# Patient Record
Sex: Female | Born: 1950 | Race: White | Hispanic: No | Marital: Married | Smoking: Never smoker
Health system: Southern US, Community
[De-identification: ages and names within clinical notes are randomized; demographics above are authoritative.]

## PROBLEM LIST (undated history)

## (undated) DIAGNOSIS — Z9889 Other specified postprocedural states: Secondary | ICD-10-CM

## (undated) DIAGNOSIS — R112 Nausea with vomiting, unspecified: Secondary | ICD-10-CM

## (undated) DIAGNOSIS — M889 Osteitis deformans of unspecified bone: Secondary | ICD-10-CM

## (undated) DIAGNOSIS — C50919 Malignant neoplasm of unspecified site of unspecified female breast: Secondary | ICD-10-CM

## (undated) DIAGNOSIS — R011 Cardiac murmur, unspecified: Secondary | ICD-10-CM

## (undated) DIAGNOSIS — E039 Hypothyroidism, unspecified: Secondary | ICD-10-CM

## (undated) DIAGNOSIS — M1990 Unspecified osteoarthritis, unspecified site: Secondary | ICD-10-CM

## (undated) DIAGNOSIS — Z8719 Personal history of other diseases of the digestive system: Secondary | ICD-10-CM

## (undated) DIAGNOSIS — Z923 Personal history of irradiation: Secondary | ICD-10-CM

## (undated) DIAGNOSIS — R351 Nocturia: Secondary | ICD-10-CM

## (undated) DIAGNOSIS — K219 Gastro-esophageal reflux disease without esophagitis: Secondary | ICD-10-CM

## (undated) DIAGNOSIS — F419 Anxiety disorder, unspecified: Secondary | ICD-10-CM

## (undated) DIAGNOSIS — S83249A Other tear of medial meniscus, current injury, unspecified knee, initial encounter: Secondary | ICD-10-CM

## (undated) DIAGNOSIS — I10 Essential (primary) hypertension: Secondary | ICD-10-CM

## (undated) HISTORY — PX: BREAST CYST ASPIRATION: SHX578

## (undated) HISTORY — PX: BACK SURGERY: SHX140

---

## 1976-01-13 HISTORY — PX: ABDOMINAL HYSTERECTOMY: SHX81

## 1989-09-12 HISTORY — PX: BURCH PROCEDURE: SHX1273

## 1990-01-12 HISTORY — PX: FOOT SURGERY: SHX648

## 1992-03-12 HISTORY — PX: THYROID LOBECTOMY: SHX420

## 1999-10-13 HISTORY — PX: HAND SURGERY: SHX662

## 2003-03-15 ENCOUNTER — Other Ambulatory Visit: Payer: Self-pay

## 2004-05-08 ENCOUNTER — Ambulatory Visit: Payer: Self-pay | Admitting: Internal Medicine

## 2004-09-18 ENCOUNTER — Ambulatory Visit: Payer: Self-pay

## 2005-06-09 ENCOUNTER — Ambulatory Visit: Payer: Self-pay | Admitting: Internal Medicine

## 2005-07-14 ENCOUNTER — Ambulatory Visit: Payer: Self-pay | Admitting: Gastroenterology

## 2005-07-16 ENCOUNTER — Ambulatory Visit: Payer: Self-pay | Admitting: Gastroenterology

## 2005-08-19 ENCOUNTER — Ambulatory Visit: Payer: Self-pay | Admitting: Internal Medicine

## 2005-09-07 ENCOUNTER — Other Ambulatory Visit: Payer: Self-pay

## 2005-09-10 ENCOUNTER — Ambulatory Visit: Payer: Self-pay | Admitting: General Surgery

## 2005-11-12 HISTORY — PX: CHOLECYSTECTOMY: SHX55

## 2006-02-16 ENCOUNTER — Ambulatory Visit: Payer: Self-pay | Admitting: Urology

## 2006-04-14 ENCOUNTER — Ambulatory Visit: Payer: Self-pay | Admitting: Internal Medicine

## 2006-06-22 ENCOUNTER — Ambulatory Visit: Payer: Self-pay | Admitting: Internal Medicine

## 2006-07-01 ENCOUNTER — Other Ambulatory Visit: Payer: Self-pay

## 2006-07-01 ENCOUNTER — Inpatient Hospital Stay: Payer: Self-pay | Admitting: Rheumatology

## 2007-01-13 HISTORY — PX: KNEE SURGERY: SHX244

## 2007-04-15 ENCOUNTER — Ambulatory Visit: Payer: Self-pay | Admitting: Gastroenterology

## 2007-05-19 ENCOUNTER — Ambulatory Visit: Payer: Self-pay | Admitting: Urology

## 2007-06-06 ENCOUNTER — Ambulatory Visit: Payer: Self-pay | Admitting: Urology

## 2007-06-16 ENCOUNTER — Ambulatory Visit: Payer: Self-pay | Admitting: Family Medicine

## 2007-06-23 ENCOUNTER — Ambulatory Visit: Payer: Self-pay | Admitting: Internal Medicine

## 2008-01-26 ENCOUNTER — Emergency Department: Payer: Self-pay | Admitting: Emergency Medicine

## 2008-06-08 ENCOUNTER — Emergency Department: Payer: Self-pay | Admitting: Emergency Medicine

## 2008-06-11 ENCOUNTER — Ambulatory Visit: Payer: Self-pay | Admitting: Emergency Medicine

## 2008-06-27 ENCOUNTER — Ambulatory Visit: Payer: Self-pay | Admitting: Internal Medicine

## 2008-07-03 ENCOUNTER — Ambulatory Visit: Payer: Self-pay | Admitting: Internal Medicine

## 2009-06-05 ENCOUNTER — Emergency Department: Payer: Self-pay | Admitting: Emergency Medicine

## 2009-07-04 ENCOUNTER — Ambulatory Visit: Payer: Self-pay | Admitting: Internal Medicine

## 2009-07-31 HISTORY — PX: KNEE SURGERY: SHX244

## 2010-03-06 ENCOUNTER — Ambulatory Visit
Admission: RE | Admit: 2010-03-06 | Discharge: 2010-03-06 | Disposition: A | Discharge status: Home / Self Care | Payer: Managed Care, Other (non HMO) | Source: Ambulatory Visit | Attending: Anesthesiology | Admitting: Anesthesiology

## 2010-03-06 ENCOUNTER — Other Ambulatory Visit: Payer: Self-pay | Admitting: Anesthesiology

## 2010-03-06 DIAGNOSIS — R52 Pain, unspecified: Secondary | ICD-10-CM

## 2010-04-04 ENCOUNTER — Ambulatory Visit: Payer: Self-pay | Admitting: Rheumatology

## 2010-06-11 ENCOUNTER — Ambulatory Visit (HOSPITAL_COMMUNITY): Payer: Managed Care, Other (non HMO) | Attending: Rheumatology

## 2010-06-11 ENCOUNTER — Encounter (HOSPITAL_COMMUNITY): Payer: Managed Care, Other (non HMO)

## 2010-06-11 DIAGNOSIS — M810 Age-related osteoporosis without current pathological fracture: Secondary | ICD-10-CM | POA: Insufficient documentation

## 2010-07-30 ENCOUNTER — Ambulatory Visit: Payer: Self-pay | Admitting: Internal Medicine

## 2011-03-05 ENCOUNTER — Ambulatory Visit: Payer: Self-pay | Admitting: Rheumatology

## 2011-04-13 DIAGNOSIS — S83249A Other tear of medial meniscus, current injury, unspecified knee, initial encounter: Secondary | ICD-10-CM

## 2011-04-13 HISTORY — DX: Other tear of medial meniscus, current injury, unspecified knee, initial encounter: S83.249A

## 2011-04-20 ENCOUNTER — Encounter (HOSPITAL_BASED_OUTPATIENT_CLINIC_OR_DEPARTMENT_OTHER): Payer: Self-pay | Admitting: *Deleted

## 2011-04-20 NOTE — Pre-Procedure Instructions (Signed)
Stress test req. from [facility].  To come for BMET and EKG

## 2011-04-21 ENCOUNTER — Encounter (HOSPITAL_BASED_OUTPATIENT_CLINIC_OR_DEPARTMENT_OTHER)
Admission: RE | Admit: 2011-04-21 | Discharge: 2011-04-21 | Disposition: A | Discharge status: Home / Self Care | Payer: Managed Care, Other (non HMO) | Source: Ambulatory Visit | Attending: Orthopaedic Surgery | Admitting: Orthopaedic Surgery

## 2011-04-21 LAB — BASIC METABOLIC PANEL
Chloride: 98 mEq/L (ref 96–112)
Creatinine, Ser: 0.79 mg/dL (ref 0.50–1.10)
GFR calc Af Amer: >90 mL/min (ref >90)
Potassium: 4.1 mEq/L (ref 3.5–5.1)
Sodium: 135 mEq/L (ref 135–145)

## 2011-04-22 NOTE — H&P (Signed)
Brittney Campbell, MD                  Jacqualine Code, PA-C   [address] [place], [place], [place]  [address]  HISTORY AND PHYSICAL   Brittney Ray            MRN:  [id number] DOB/SEX:  1950-11-02/female   CHIEF COMPLAINT:  Painful right knee  HISTORY: Brittney Ray is a very pleasant 61 year old white female who is seen today for evaluation of her right knee.  She has had an arthroscopic debridement by Dr. 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  She had done okay for a little while.  She started physical therapy and apparently they were pushing her and she felt something pop in her knee.  She unfortunately has continued to be quite painful.  She had been seen several times postoperatively and it was felt that it was mainly that of arthritic pain and the options of Dr. Corliss Skains and Dr. Thyra Breed for chronic were discussed with her.  Therefore she started seeing Dr. Corliss Skains in February of this year.  She has also had some hip pain and does have elevation of alkaline phosphatase and there was a question of whether she had Padgett's disease.  She, however, has continued to have this right knee pain parapatellar and medially.  She has had several injections, but because of this the patient was scheduled for an MRI scan of the right knee which did reveal a large radial tear versus changes of a partial meniscectomy involving the posterior horn and body of the medial meniscus with medial meniscus extrusion.    PAST MEDICAL HISTORY: There are no active problems to display for this patient.  Past Medical History  Diagnosis Date  . PONV (postoperative nausea and vomiting)   . H/O hiatal hernia   . GERD (gastroesophageal reflux disease)   . Arthritis     knees  . Hypothyroidism   . Nocturia     3-4 x/night  . Anxiety     panic attacks; no curren med.  Marland Kitchen  Heart murmur     states never had any problems  . Hypertension     under control, has been on med. > 10 yrs.  . Paget's bone disease     hips  . Medial meniscus tear 04/2011    right knee  . Diabetes mellitus     NIDDM   Past Surgical History  Procedure Date  . Knee surgery 07/31/2009    right  . Abdominal hysterectomy 1978    partial  . Cholecystectomy 11/2005  . Back surgery 04/1990, 1996  . Foot surgery 1992    left  . Thyroid lobectomy 03/1992    left  . Hand surgery 10/1999    nerve repair right thumb  . Knee surgery 2009    left  . Burch procedure 09/1989    with rectal repair     MEDICATIONS PRIOR TO ADMISSION: Prior to Admission medications   Medication Sig Start Date End Date Taking? Authorizing Provider  calcium carbonate (OS-CAL) 600 MG TABS Take 600 mg by mouth 2 (two) times daily with a meal.   Yes Historical Provider, MD  cholecalciferol (VITAMIN D) 1000 UNITS tablet Take 1,000 Units by mouth daily.   Yes Historical Provider, MD  fish oil-omega-3 fatty acids 1000 MG capsule Take 2  g by mouth daily.   Yes Historical Provider, MD  levothyroxine (SYNTHROID, LEVOTHROID) 50 MCG tablet Take 50 mcg by mouth daily. AM   Yes Historical Provider, MD  lisinopril-hydrochlorothiazide (PRINZIDE,ZESTORETIC) 10-12.5 MG per tablet Take 1 tablet by mouth daily. AM   Yes Historical Provider, MD  metFORMIN (GLUCOPHAGE) 500 MG tablet Take 500 mg by mouth daily after supper.   Yes Historical Provider, MD  pantoprazole (PROTONIX) 40 MG tablet Take 40 mg by mouth daily before lunch. NOON   Yes Historical Provider, MD     ALLERGIES:   Allergies  Allergen Reactions  . Contrast Media (Iodinated Diagnostic Agents) Hives and Cough  . Morphine And Related Other (See Comments)    JITTERY  . Codeine Other (See Comments)    PANIC ATTACKS  . Sulfa Antibiotics Other (See Comments)    STOMACH IRRITATION    REVIEW OF SYSTEMS: 14 point review is unremarkable except for diabetes, hypothyroid  s/p subtotal thyroidectomy, easy bruising, and nocturia.  FAMILY HISTORY:  History reviewed. No pertinent family history.  SOCIAL HISTORY:   History  Substance Use Topics  . Smoking status: Never Smoker   . Smokeless tobacco: Never Used  . Alcohol Use: No     EXAMINATION: Vital signs in last 24 hours: Temp:  [98.4 F (36.9 C)] 98.4 F (36.9 C) (04/11 1143) Pulse Rate:  [68] 68  (04/11 1143) Resp:  [18] 18  (04/11 1143) BP: (134)/(80) 134/80 mmHg (04/11 1143) SpO2:  [97 %] 97 % (04/11 1143)  General appearance: alert, cooperative and mild distress Head: Normocephalic, without obvious abnormality, atraumatic Eyes: negative findings: pupils equal, round, reactive to light and accomodation Ears: normal TM's and external ear canals both ears Nose: Nares normal. Septum midline. Mucosa normal. No drainage or sinus tenderness. Throat: lips, mucosa, and tongue normal; teeth and gums normal Neck: no carotid bruit and supple, symmetrical, trachea midline Lungs: clear to auscultation bilaterally Heart: regular rate and rhythm Abdomen: normal findings: bowel sounds normal Neurologic: Mental status: Alert, oriented, thought content appropriate, orientation: time, date, person, place, city, president  Musculoskeletal Exam  : she has range of motion from 0 degrees to 110 degrees.  She does have some diffuse tenderness about the parapatellar area and more along the medial joint line.  McMurray's is not particularly positive at this time.  Negative Lachman's and drawer.  Neurovascularly intact distally.   DIAGNOSTIC STUDIES: Recent laboratory studies:  Basename 04/21/11 1110  NA 135  K 4.1  CL 98  CO2 26  BUN 17  CREATININE 0.79  GLUCOSE 271*  CALCIUM 9.4    Recent Radiographic Studies : MRI:   Impression:  Large radial tear versus changes of partial meniscectomy involving the posterior horn of body of medial meniscus.  Partial thickness cartilage loss in the medial and patellofemoral  compartments.  Mild edema within the suprapatellar fat pad.  ASSESSMENT: Large radial tear posterior horn of medial meniscus right knee  PLAN: Arthroscopic debridement of right knee.  Tiara Maultsby 04/23/2011, 1:26 PM

## 2011-04-23 ENCOUNTER — Encounter (HOSPITAL_BASED_OUTPATIENT_CLINIC_OR_DEPARTMENT_OTHER): Payer: Self-pay | Admitting: Anesthesiology

## 2011-04-23 ENCOUNTER — Encounter (HOSPITAL_BASED_OUTPATIENT_CLINIC_OR_DEPARTMENT_OTHER)
Admission: RE | Disposition: A | Discharge status: Home / Self Care | Payer: Self-pay | Source: Ambulatory Visit | Attending: Orthopaedic Surgery

## 2011-04-23 ENCOUNTER — Ambulatory Visit (HOSPITAL_BASED_OUTPATIENT_CLINIC_OR_DEPARTMENT_OTHER)
Admission: RE | Admit: 2011-04-23 | Discharge: 2011-04-23 | Disposition: A | Discharge status: Home / Self Care | Payer: Managed Care, Other (non HMO) | Source: Ambulatory Visit | Attending: Orthopaedic Surgery | Admitting: Orthopaedic Surgery

## 2011-04-23 ENCOUNTER — Encounter (HOSPITAL_BASED_OUTPATIENT_CLINIC_OR_DEPARTMENT_OTHER): Payer: Self-pay | Admitting: *Deleted

## 2011-04-23 ENCOUNTER — Ambulatory Visit (HOSPITAL_BASED_OUTPATIENT_CLINIC_OR_DEPARTMENT_OTHER): Payer: Managed Care, Other (non HMO) | Admitting: Anesthesiology

## 2011-04-23 DIAGNOSIS — G8929 Other chronic pain: Secondary | ICD-10-CM | POA: Insufficient documentation

## 2011-04-23 DIAGNOSIS — Z01812 Encounter for preprocedural laboratory examination: Secondary | ICD-10-CM | POA: Insufficient documentation

## 2011-04-23 DIAGNOSIS — M23305 Other meniscus derangements, unspecified medial meniscus, unspecified knee: Secondary | ICD-10-CM | POA: Insufficient documentation

## 2011-04-23 DIAGNOSIS — I10 Essential (primary) hypertension: Secondary | ICD-10-CM | POA: Insufficient documentation

## 2011-04-23 DIAGNOSIS — Z01810 Encounter for preprocedural cardiovascular examination: Secondary | ICD-10-CM | POA: Insufficient documentation

## 2011-04-23 DIAGNOSIS — E119 Type 2 diabetes mellitus without complications: Secondary | ICD-10-CM | POA: Insufficient documentation

## 2011-04-23 DIAGNOSIS — M1710 Unilateral primary osteoarthritis, unspecified knee: Secondary | ICD-10-CM | POA: Insufficient documentation

## 2011-04-23 HISTORY — DX: Nocturia: R35.1

## 2011-04-23 HISTORY — DX: Gastro-esophageal reflux disease without esophagitis: K21.9

## 2011-04-23 HISTORY — DX: Anxiety disorder, unspecified: F41.9

## 2011-04-23 HISTORY — DX: Osteitis deformans of unspecified bone: M88.9

## 2011-04-23 HISTORY — DX: Essential (primary) hypertension: I10

## 2011-04-23 HISTORY — DX: Other specified postprocedural states: Z98.890

## 2011-04-23 HISTORY — DX: Other tear of medial meniscus, current injury, unspecified knee, initial encounter: S83.249A

## 2011-04-23 HISTORY — DX: Hypothyroidism, unspecified: E03.9

## 2011-04-23 HISTORY — DX: Unspecified osteoarthritis, unspecified site: M19.90

## 2011-04-23 HISTORY — DX: Other specified postprocedural states: R11.2

## 2011-04-23 HISTORY — DX: Cardiac murmur, unspecified: R01.1

## 2011-04-23 HISTORY — DX: Personal history of other diseases of the digestive system: Z87.19

## 2011-04-23 LAB — GLUCOSE, CAPILLARY: Glucose-Capillary: 129 mg/dL — ABNORMAL HIGH (ref 70–99)

## 2011-04-23 SURGERY — ARTHROSCOPY, KNEE, WITH MEDIAL MENISCECTOMY
Anesthesia: General | Site: Knee | Laterality: Right | Wound class: Clean

## 2011-04-23 MED ORDER — CHLORHEXIDINE GLUCONATE 4 % EX LIQD: 60.0000 mL | Freq: Once | CUTANEOUS | Status: DC

## 2011-04-23 MED ORDER — DEXAMETHASONE SODIUM PHOSPHATE 4 MG/ML IJ SOLN
INTRAMUSCULAR | Status: DC | PRN
  Administered 2011-04-23: 10 mg via INTRAVENOUS

## 2011-04-23 MED ORDER — PROPOFOL 10 MG/ML IV EMUL
INTRAVENOUS | Status: DC | PRN
  Administered 2011-04-23: 150 mg via INTRAVENOUS

## 2011-04-23 MED ORDER — MORPHINE SULFATE 4 MG/ML IJ SOLN: 0.0500 mg/kg | INTRAMUSCULAR | Status: DC | PRN

## 2011-04-23 MED ORDER — ONDANSETRON HCL 4 MG/2ML IJ SOLN
INTRAMUSCULAR | Status: DC | PRN
  Administered 2011-04-23: 4 mg via INTRAVENOUS

## 2011-04-23 MED ORDER — MIDAZOLAM HCL 5 MG/5ML IJ SOLN
INTRAMUSCULAR | Status: DC | PRN
  Administered 2011-04-23: 2 mg via INTRAVENOUS

## 2011-04-23 MED ORDER — HYDROMORPHONE HCL PF 1 MG/ML IJ SOLN
0.2500 mg | INTRAMUSCULAR | Status: DC | PRN
  Administered 2011-04-23: 0.25 mg via INTRAVENOUS

## 2011-04-23 MED ORDER — SODIUM CHLORIDE 0.9 % IR SOLN
Status: DC | PRN
  Administered 2011-04-23: 3000 mL

## 2011-04-23 MED ORDER — KETOROLAC TROMETHAMINE 30 MG/ML IJ SOLN
INTRAMUSCULAR | Status: DC | PRN
  Administered 2011-04-23: 30 mg via INTRAVENOUS

## 2011-04-23 MED ORDER — BUPIVACAINE-EPINEPHRINE PF 0.25-1:200000 % IJ SOLN
INTRAMUSCULAR | Status: DC | PRN
  Administered 2011-04-23: 15 mL

## 2011-04-23 MED ORDER — CEFAZOLIN SODIUM 1-5 GM-% IV SOLN
INTRAVENOUS | Status: DC | PRN
  Administered 2011-04-23: 2 g via INTRAVENOUS

## 2011-04-23 MED ORDER — ONDANSETRON HCL 4 MG/2ML IJ SOLN: 4.0000 mg | Freq: Once | INTRAMUSCULAR | Status: DC | PRN

## 2011-04-23 MED ORDER — SODIUM CHLORIDE 0.9 % IV SOLN: INTRAVENOUS | Status: DC

## 2011-04-23 MED ORDER — LACTATED RINGERS IV SOLN
INTRAVENOUS | Status: DC
  Administered 2011-04-23 (×3): via INTRAVENOUS

## 2011-04-23 MED ORDER — FENTANYL CITRATE 0.05 MG/ML IJ SOLN
INTRAMUSCULAR | Status: DC | PRN
  Administered 2011-04-23: 50 ug via INTRAVENOUS
  Administered 2011-04-23: 25 ug via INTRAVENOUS

## 2011-04-23 MED ORDER — LIDOCAINE HCL (CARDIAC) 20 MG/ML IV SOLN
INTRAVENOUS | Status: DC | PRN
  Administered 2011-04-23: 100 mg via INTRAVENOUS

## 2011-04-23 SURGICAL SUPPLY — 38 items
BANDAGE ELASTIC 6 VELCRO ST LF (GAUZE/BANDAGES/DRESSINGS) ×2 IMPLANT
BANDAGE GAUZE ELAST BULKY 4 IN (GAUZE/BANDAGES/DRESSINGS) ×2 IMPLANT
BLADE 4.2CUDA (BLADE) IMPLANT
BLADE CUDA SHAVER 3.5 (BLADE) ×2 IMPLANT
BLADE CUTTER GATOR 3.5 (BLADE) IMPLANT
BLADE GREAT WHITE 4.2 (BLADE) IMPLANT
CANISTER OMNI JUG 16 LITER (MISCELLANEOUS) ×2 IMPLANT
CANISTER SUCTION 2500CC (MISCELLANEOUS) IMPLANT
DRAPE ARTHROSCOPY W/POUCH 90 (DRAPES) ×2 IMPLANT
DRSG EMULSION OIL 3X3 NADH (GAUZE/BANDAGES/DRESSINGS) ×2 IMPLANT
DURAPREP 26ML APPLICATOR (WOUND CARE) ×2 IMPLANT
ELECT MENISCUS 165MM 90D (ELECTRODE) IMPLANT
ELECT REM PT RETURN 9FT ADLT (ELECTROSURGICAL)
ELECTRODE REM PT RTRN 9FT ADLT (ELECTROSURGICAL) IMPLANT
GAUZE SPONGE 4X4 12PLY STRL LF (GAUZE/BANDAGES/DRESSINGS) ×2 IMPLANT
GLOVE BIO SURGEON STRL SZ7 (GLOVE) IMPLANT
GLOVE BIO SURGEON STRL SZ7.5 (GLOVE) IMPLANT
GLOVE BIOGEL PI IND STRL 7.5 (GLOVE) IMPLANT
GLOVE BIOGEL PI IND STRL 8.5 (GLOVE) ×1 IMPLANT
GLOVE BIOGEL PI INDICATOR 7.5 (GLOVE)
GLOVE BIOGEL PI INDICATOR 8.5 (GLOVE) ×1
GLOVE ECLIPSE 7.0 STRL STRAW (GLOVE) IMPLANT
GLOVE ECLIPSE 8.0 STRL XLNG CF (GLOVE) ×2 IMPLANT
GLOVE SURG ORTHO 8.5 STRL (GLOVE) ×2 IMPLANT
GOWN PREVENTION PLUS XLARGE (GOWN DISPOSABLE) ×2 IMPLANT
HOLDER KNEE FOAM BLUE (MISCELLANEOUS) ×2 IMPLANT
KNEE WRAP E Z 3 GEL PACK (MISCELLANEOUS) ×2 IMPLANT
PACK ARTHROSCOPY DSU (CUSTOM PROCEDURE TRAY) ×2 IMPLANT
PACK BASIN DAY SURGERY FS (CUSTOM PROCEDURE TRAY) ×2 IMPLANT
PAD CAST 4YDX4 CTTN HI CHSV (CAST SUPPLIES) ×1 IMPLANT
PADDING CAST COTTON 4X4 STRL (CAST SUPPLIES) ×1
PENCIL BUTTON HOLSTER BLD 10FT (ELECTRODE) IMPLANT
SET ARTHROSCOPY TUBING (MISCELLANEOUS) ×1
SET ARTHROSCOPY TUBING LN (MISCELLANEOUS) ×1 IMPLANT
SUT ETHILON 4 0 PS 2 18 (SUTURE) IMPLANT
TOWEL OR 17X24 6PK STRL BLUE (TOWEL DISPOSABLE) ×2 IMPLANT
WAND STAR VAC 90 (SURGICAL WAND) IMPLANT
WATER STERILE IRR 1000ML POUR (IV SOLUTION) ×2 IMPLANT

## 2011-04-23 NOTE — Anesthesia Procedure Notes (Signed)
Procedure Name: LMA Insertion Date/Time: 04/23/2011 2:12 PM Performed by: Gar Gibbon Pre-anesthesia Checklist: Patient identified, Emergency Drugs available, Suction available and Patient being monitored Patient Re-evaluated:Patient Re-evaluated prior to inductionOxygen Delivery Method: Circle System Utilized Preoxygenation: Pre-oxygenation with 100% oxygen Intubation Type: IV induction Ventilation: Mask ventilation without difficulty LMA: LMA with gastric port inserted LMA Size: 4.0 Number of attempts: 1 Placement Confirmation: positive ETCO2 Tube secured with: Tape Dental Injury: Teeth and Oropharynx as per pre-operative assessment

## 2011-04-23 NOTE — Transfer of Care (Signed)
Immediate Anesthesia Transfer of Care Note  Patient: Brittney Ray  Procedure(s) Performed: Procedure(s) (LRB): KNEE ARTHROSCOPY WITH MEDIAL MENISECTOMY (Right)  Patient Location: PACU  Anesthesia Type: General  Level of Consciousness: sedated  Airway & Oxygen Therapy: Patient Spontanous Breathing and Patient connected to face mask oxygen  Post-op Assessment: Report given to PACU RN and Post -op Vital signs reviewed and stable  Post vital signs: Reviewed and stable  Complications: No apparent anesthesia complications

## 2011-04-23 NOTE — Brief Op Note (Signed)
04/23/2011  2:43 PM  PATIENT:  Brittney Ray  61 y.o. female  PRE-OPERATIVE DIAGNOSIS:  mmt rt knee with tri-compartmental DJD  POST-OPERATIVE DIAGNOSIS:  mmt rt knee-same  PROCEDURE:  Procedure(s) (LRB): KNEE ARTHROSCOPY WITH MEDIAL MENISECTOMY (Right), synovectomy  SURGEON:  Surgeon(s) and Role:    * Valeria Batman, MD - Primary  PHYSICIAN ASSISTANT: none   ANESTHESIA:   general  EBL:     BLOOD ADMINISTERED:none  DRAINS: none   LOCAL MEDICATIONS USED:  MARCAINE     SPECIMEN:  No Specimen  DISPOSITION OF SPECIMEN:  N/A  COUNTS:  YES  TOURNIQUET:  * No tourniquets in log *  DICTATION: .Other Dictation: Dictation Number [id number]  PLAN OF CARE: Discharge to home after PACU  PATIENT DISPOSITION:  PACU - hemodynamically stable.   Delay start of Pharmacological VTE agent (>24hrs) due to surgical blood loss or risk of bleeding: not applicable

## 2011-04-23 NOTE — Anesthesia Preprocedure Evaluation (Signed)
Anesthesia Evaluation  Patient identified by MRN, date of birth, ID band Patient awake    Reviewed: Allergy & Precautions, H&P , NPO status , Patient's Chart, lab work & pertinent test results  Airway Mallampati: I TM Distance: >3 FB Neck ROM: Full    Dental  (+) Teeth Intact and Dental Advisory Given   Pulmonary  breath sounds clear to auscultation        Cardiovascular Rhythm:Regular     Neuro/Psych    GI/Hepatic   Endo/Other  Diabetes mellitus-, Well Controlled, Type 2, Oral Hypoglycemic AgentsMorbid obesity  Renal/GU      Musculoskeletal   Abdominal   Peds  Hematology   Anesthesia Other Findings   Reproductive/Obstetrics                           Anesthesia Physical Anesthesia Plan  ASA: III  Anesthesia Plan: General   Post-op Pain Management:    Induction: Intravenous  Airway Management Planned: LMA  Additional Equipment:   Intra-op Plan:   Post-operative Plan: Extubation in OR  Informed Consent: I have reviewed the patients History and Physical, chart, labs and discussed the procedure including the risks, benefits and alternatives for the proposed anesthesia with the patient or authorized representative who has indicated his/her understanding and acceptance.   Dental advisory given  Plan Discussed with: CRNA, Anesthesiologist and Surgeon  Anesthesia Plan Comments: (Pt declined Scopalomine patch. Will use IV Decadron and Zofran for prevention of nausea and vomiting.)        Anesthesia Quick Evaluation

## 2011-04-23 NOTE — H&P (Signed)
There has been no change in health status since  the current H&P.I have examined the patient and discussed the surgery. No contraindications to the planned procedure exist.    There has been no change in health status since  the current H&P.I have examined the patient and discussed the surgery. No contraindications to the planned procedure exist.

## 2011-04-23 NOTE — Anesthesia Postprocedure Evaluation (Signed)
Anesthesia Post Note  Patient: Brittney Ray  Procedure(s) Performed: Procedure(s) (LRB): KNEE ARTHROSCOPY WITH MEDIAL MENISECTOMY (Right)  Anesthesia type: General  Patient location: PACU  Post pain: Pain level controlled  Post assessment: Patient's Cardiovascular Status Stable  Last Vitals:  Filed Vitals:   04/23/11 1600  BP: 122/66  Pulse: 69  Temp:   Resp: 18    Post vital signs: Reviewed and stable  Level of consciousness: alert  Complications: No apparent anesthesia complications

## 2011-04-23 NOTE — Discharge Instructions (Addendum)
Diet: As you were doing prior to hospitalization or   Activity:  Increase activity slowly as tolerated                  No lifting or driving for 2 weeks  Shower:  May shower without a dressing once there is no drainage from your wound.                 Do NOT wash over the wound.  If drainage remains, cover wound with saran                  Wrap and then shower.  Clean incision with betadine and change dressing                        After saran wrap removed.  Dressing:  You may change your dressing on Saturday                    Then change the dressing daily with sterile 4"x4"s gauze dressing                     And TED hose for knees.  Use paper tape to hold dressing in place                     For hips.  You may clean the incision with alcohol prior to redressing.  Weight Bearing:   weight bearing as  Tolerated.  Use a walker or                    Crutches as instructed.  To prevent constipation: you may use a stool softener such as -               Colace ( over the counter) 100 mg by mouth twice a day                Drink plenty of fluids ( prune juice may be helpful) and high fiber foods                Miralax ( over the counter) for constipation as needed.    Precautions:  If you experience chest pain or shortness of breath - call 911 immediately               For transfer to the hospital emergency department!!               If you develop a fever greater that 101 F, purulent drainage from wound,                             increased redness or drainage from wound, or calf pain -- Call the office at                                                 [phone].  Follow- Up Appointment:  Please call for an appointment to be seen on Monday               [facility] - [phone]               [facility] - [phone]               [place] - [phone]    Post Anesthesia Home Care Instructions  Activity: Get plenty of rest for the remainder of the day. A responsible adult  should stay with you for 24 hours following the procedure.  For the next 24 hours, DO NOT: -Drive a car -Advertising copywriter -Drink alcoholic beverages -Take any medication unless instructed by your physician -Make any legal decisions or sign important papers.  Meals: Start with liquid foods such as gelatin or soup. Progress to regular foods as tolerated. Avoid greasy, spicy, heavy foods. If nausea and/or vomiting occur, drink only clear liquids until the nausea and/or vomiting subsides. Call your physician if vomiting continues.  Special Instructions/Symptoms: Your throat may feel dry or sore from the anesthesia or the breathing tube placed in your throat during surgery. If this causes discomfort, gargle with warm salt water. The discomfort should disappear within 24 hours.

## 2011-04-24 NOTE — Op Note (Signed)
NAME:  Brittney Ray, Brittney Ray NO.:  MEDICAL RECORD NO.:  [id number]  LOCATION:                                 FACILITY:  PHYSICIAN:  Claude Manges. Cleophas Dunker, M.D.    DATE OF BIRTH:  DATE OF PROCEDURE:  04/23/2011 DATE OF DISCHARGE:                              OPERATIVE REPORT   PREOPERATIVE DIAGNOSIS:  Tear of medial meniscus, right knee with tricompartmental degenerative arthrosis.  POSTOPERATIVE DIAGNOSIS:  Tear of medial meniscus, right knee with tricompartmental degenerative arthrosis.  PROCEDURES: 1. Diagnostic arthroscopy, right knee. 2. Partial medial meniscectomy. 3. Synovectomy.  SURGEON:  Claude Manges. Cleophas Dunker, MD  ANESTHESIA:  General laryngeal with a local knee block.  COMPLICATIONS:  None.  HISTORY:  A 61 year old female underwent a right knee arthroscopy in July 2011.  She had a tear of the posterior horn of the medial meniscus as well as grade 2 to grade 3 changes chondromalacia of the medial femoral condyle.  She had done well for a while, although she does have history of chronic pain for which she visits a pain clinic and for other reasons and was in the midst of physical therapy session when she heard a "pop" in her right knee.  Since that time, she has had increasing pain.  She was seen in the office with an MRI scan revealing a large radial tear of medial meniscus versus changes of a partial meniscectomy. She continues to have pain to the point of compromise and thus wishes to proceed with a knee arthroscopy.  PROCEDURE:  Ms. Phegley was met in the holding area with the family identified the right knee as appropriate operative site.  She was transported to room #6 and placed under general laryngeal anesthesia without difficulty.  She did receive a preoperative knee block.  Right lower extremity was then placed in a thigh holder.  The leg was prepped with DuraPrep and a thigh holder.  The ankle sterile draping  was performed.  Diagnostic arthroscopy was performed using the medial and lateral parapatellar puncture site.  Arthroscope was placed in the lateral portal.  There was minimal clear yellow effusion.  Diagnostic arthroscopy revealed some mild chondromalacia of the patella representing grade 1 and some early grade 2 changes.  The medial patella was a little bit tight, but I did not see any corresponding areas of cartilage change on the femoral condyle.  There was grade 3 change of chondromalacia in the trochlea with some loose articular cartilage which was debrided.  There was minimal synovitis in the superior pouch and both gutters.  In the medial compartment, there was an old partial medial meniscectomy. There was some tearing just proximal to the old tear with some underneath surface tearing of the meniscus.  This was debrided.  I carefully probed the remaining meniscus and it appeared to be intact. There was minimal chondromalacia of the femoral condyle representing grade 1 and 2 changes but very spotty and very minimal chondromalacia.  ACL and PCL appeared to be intact.  There was some grade 2 change of chondromalacia of the lateral tibial plateau.  The lateral compartment was quite tight, but the meniscus was intact.  There was no  loose material.  The joint was then reinspected without evidence of loose material.  The puncture sites were left open.  Infiltrated 0.25% Marcaine with epinephrine.  Sterile bulky dressing was applied followed by an Ace bandage.  PLAN:  Tramadol for pain.  She did receive Ofirmev and Toradol intraoperatively, and we will plan to see her back in the office first of the week.     Claude Manges. Cleophas Dunker, M.D.     PWW/MEDQ  D:  04/23/2011  T:  04/24/2011  Job:  [id number]

## 2011-06-19 ENCOUNTER — Ambulatory Visit: Payer: Self-pay | Admitting: Rheumatology

## 2011-06-23 ENCOUNTER — Inpatient Hospital Stay: Payer: Self-pay | Admitting: Family Medicine

## 2011-06-23 LAB — TSH: Thyroid Stimulating Horm: 0.79 u[IU]/mL

## 2011-06-23 LAB — COMPREHENSIVE METABOLIC PANEL
Albumin: 3.6 g/dL (ref 3.4–5.0)
Anion Gap: 10 (ref 7–16)
Bilirubin,Total: 0.9 mg/dL (ref 0.2–1.0)
Calcium, Total: 9.4 mg/dL (ref 8.5–10.1)
Chloride: 100 mmol/L (ref 98–107)
Creatinine: 0.96 mg/dL (ref 0.60–1.30)
Glucose: 158 mg/dL — ABNORMAL HIGH (ref 65–99)
Osmolality: 274 (ref 275–301)
Potassium: 3.6 mmol/L (ref 3.5–5.1)
SGOT(AST): 47 U/L — ABNORMAL HIGH (ref 15–37)
Sodium: 135 mmol/L — ABNORMAL LOW (ref 136–145)

## 2011-06-23 LAB — URINALYSIS, COMPLETE
Bilirubin,UR: NEGATIVE
Ketone: NEGATIVE
Nitrite: NEGATIVE
Ph: 5 (ref 4.5–8.0)
Protein: NEGATIVE
RBC,UR: 1 /HPF (ref 0–5)
Specific Gravity: 1.013 (ref 1.003–1.030)
Squamous Epithelial: <1

## 2011-06-23 LAB — PROTIME-INR: INR: 1

## 2011-06-23 LAB — CBC
HCT: 42.9 % (ref 35.0–47.0)
MCHC: 33.2 g/dL (ref 32.0–36.0)
MCV: 84 fL (ref 80–100)
WBC: 9.9 10*3/uL (ref 3.6–11.0)

## 2011-06-23 LAB — CK TOTAL AND CKMB (NOT AT ARMC)
CK, Total: 260 U/L — ABNORMAL HIGH (ref 21–215)
CK, Total: 227 U/L — ABNORMAL HIGH (ref 21–215)

## 2011-06-23 LAB — TROPONIN I: Troponin-I: 5.6 ng/mL — ABNORMAL HIGH

## 2011-06-23 LAB — APTT: Activated PTT: 79.3 secs — ABNORMAL HIGH (ref 23.6–35.9)

## 2011-06-24 ENCOUNTER — Other Ambulatory Visit: Payer: Self-pay | Admitting: Rheumatology

## 2011-06-24 DIAGNOSIS — M25561 Pain in right knee: Secondary | ICD-10-CM

## 2011-06-24 LAB — CK TOTAL AND CKMB (NOT AT ARMC)
CK-MB: 10.2 ng/mL — ABNORMAL HIGH (ref 0.5–3.6)
CK-MB: 6.5 ng/mL — ABNORMAL HIGH (ref 0.5–3.6)

## 2011-06-24 LAB — BASIC METABOLIC PANEL
BUN: 14 mg/dL (ref 7–18)
Calcium, Total: 9.3 mg/dL (ref 8.5–10.1)
Chloride: 99 mmol/L (ref 98–107)
Creatinine: 1.01 mg/dL (ref 0.60–1.30)
EGFR (African American): >60
Glucose: 249 mg/dL — ABNORMAL HIGH (ref 65–99)
Potassium: 3.8 mmol/L (ref 3.5–5.1)
Sodium: 136 mmol/L (ref 136–145)

## 2011-06-24 LAB — HEMOGLOBIN A1C: Hemoglobin A1C: 7.4 % — ABNORMAL HIGH (ref 4.2–6.3)

## 2011-06-24 LAB — CBC WITH DIFFERENTIAL/PLATELET
Basophil #: 0 10*3/uL (ref 0.0–0.1)
Basophil %: 0.3 %
Eosinophil #: 0 10*3/uL (ref 0.0–0.7)
Eosinophil %: 0 %
HCT: 43.2 % (ref 35.0–47.0)
HGB: 14.5 g/dL (ref 12.0–16.0)
Lymphocyte #: 1.8 10*3/uL (ref 1.0–3.6)
MCHC: 33.6 g/dL (ref 32.0–36.0)
MCV: 85 fL (ref 80–100)
Neutrophil %: 80.8 %
RBC: 5.08 10*6/uL (ref 3.80–5.20)
RDW: 14.5 % (ref 11.5–14.5)
WBC: 10.1 10*3/uL (ref 3.6–11.0)

## 2011-06-24 LAB — LIPID PANEL
HDL Cholesterol: 54 mg/dL (ref 40–60)
Ldl Cholesterol, Calc: 99 mg/dL (ref 0–100)
Triglycerides: 214 mg/dL — ABNORMAL HIGH (ref 0–200)

## 2011-06-24 LAB — APTT: Activated PTT: 101.2 secs — ABNORMAL HIGH (ref 23.6–35.9)

## 2011-06-25 LAB — CBC WITH DIFFERENTIAL/PLATELET
Basophil #: 0.1 10*3/uL (ref 0.0–0.1)
Basophil %: 0.8 %
Eosinophil #: 0 10*3/uL (ref 0.0–0.7)
HGB: 12.2 g/dL (ref 12.0–16.0)
MCH: 28.1 pg (ref 26.0–34.0)
Monocyte #: 0.6 x10 3/mm (ref 0.2–0.9)
Monocyte %: 5.4 %
RBC: 4.35 10*6/uL (ref 3.80–5.20)
RDW: 14.6 % — ABNORMAL HIGH (ref 11.5–14.5)
WBC: 10.5 10*3/uL (ref 3.6–11.0)

## 2011-06-25 LAB — BASIC METABOLIC PANEL
Anion Gap: 9 (ref 7–16)
BUN: 14 mg/dL (ref 7–18)
Chloride: 105 mmol/L (ref 98–107)
Co2: 26 mmol/L (ref 21–32)
EGFR (African American): >60
EGFR (Non-African Amer.): >60
Glucose: 144 mg/dL — ABNORMAL HIGH (ref 65–99)
Osmolality: 282 (ref 275–301)

## 2011-07-03 ENCOUNTER — Other Ambulatory Visit: Payer: Managed Care, Other (non HMO)

## 2011-08-04 ENCOUNTER — Ambulatory Visit: Payer: Self-pay | Admitting: Family Medicine

## 2011-08-05 ENCOUNTER — Encounter: Payer: Self-pay | Admitting: Cardiology

## 2011-08-06 ENCOUNTER — Ambulatory Visit: Payer: Self-pay | Admitting: Family Medicine

## 2011-08-13 ENCOUNTER — Encounter: Payer: Self-pay | Admitting: Cardiology

## 2011-09-13 ENCOUNTER — Encounter: Payer: Self-pay | Admitting: Cardiology

## 2011-10-13 ENCOUNTER — Encounter: Payer: Self-pay | Admitting: Cardiology

## 2011-10-21 ENCOUNTER — Other Ambulatory Visit: Payer: Self-pay | Admitting: Orthopaedic Surgery

## 2011-10-21 DIAGNOSIS — IMO0002 Reserved for concepts with insufficient information to code with codable children: Secondary | ICD-10-CM

## 2011-10-21 DIAGNOSIS — R52 Pain, unspecified: Secondary | ICD-10-CM

## 2011-10-29 ENCOUNTER — Ambulatory Visit
Admission: RE | Admit: 2011-10-29 | Discharge: 2011-10-29 | Disposition: A | Discharge status: Home / Self Care | Payer: Managed Care, Other (non HMO) | Source: Ambulatory Visit | Attending: Orthopaedic Surgery | Admitting: Orthopaedic Surgery

## 2011-10-29 DIAGNOSIS — R52 Pain, unspecified: Secondary | ICD-10-CM

## 2011-10-29 DIAGNOSIS — IMO0002 Reserved for concepts with insufficient information to code with codable children: Secondary | ICD-10-CM

## 2011-11-13 ENCOUNTER — Encounter: Payer: Self-pay | Admitting: Cardiology

## 2011-12-14 ENCOUNTER — Encounter: Payer: Self-pay | Admitting: Cardiology

## 2012-01-13 DIAGNOSIS — Z923 Personal history of irradiation: Secondary | ICD-10-CM

## 2012-01-13 DIAGNOSIS — C50919 Malignant neoplasm of unspecified site of unspecified female breast: Secondary | ICD-10-CM

## 2012-01-13 HISTORY — DX: Personal history of irradiation: Z92.3

## 2012-01-13 HISTORY — DX: Malignant neoplasm of unspecified site of unspecified female breast: C50.919

## 2012-02-08 ENCOUNTER — Ambulatory Visit: Payer: Self-pay | Admitting: Surgery

## 2012-03-12 ENCOUNTER — Ambulatory Visit: Payer: Self-pay | Admitting: Hematology and Oncology

## 2012-03-14 ENCOUNTER — Ambulatory Visit: Payer: Self-pay | Admitting: Surgery

## 2012-03-14 HISTORY — PX: BREAST BIOPSY: SHX20

## 2012-03-23 ENCOUNTER — Ambulatory Visit: Payer: Self-pay | Admitting: Surgery

## 2012-03-23 LAB — BASIC METABOLIC PANEL
Calcium, Total: 9 mg/dL (ref 8.5–10.1)
Chloride: 102 mmol/L (ref 98–107)
Co2: 26 mmol/L (ref 21–32)
EGFR (Non-African Amer.): >60
Glucose: 78 mg/dL (ref 65–99)
Osmolality: 273 (ref 275–301)
Potassium: 3.5 mmol/L (ref 3.5–5.1)
Sodium: 137 mmol/L (ref 136–145)

## 2012-03-23 LAB — CBC WITH DIFFERENTIAL/PLATELET
Basophil %: 0.5 %
Eosinophil %: 1.6 %
HCT: 39.2 % (ref 35.0–47.0)
HGB: 12.8 g/dL (ref 12.0–16.0)
MCH: 25.8 pg — ABNORMAL LOW (ref 26.0–34.0)
MCHC: 32.7 g/dL (ref 32.0–36.0)
Monocyte #: 0.7 x10 3/mm (ref 0.2–0.9)
Monocyte %: 7.9 %
Neutrophil %: 51.9 %
Platelet: 238 10*3/uL (ref 150–440)
RDW: 16.3 % — ABNORMAL HIGH (ref 11.5–14.5)
WBC: 9.1 10*3/uL (ref 3.6–11.0)

## 2012-03-29 ENCOUNTER — Ambulatory Visit: Payer: Self-pay | Admitting: Surgery

## 2012-03-29 HISTORY — PX: BREAST LUMPECTOMY: SHX2

## 2012-04-12 ENCOUNTER — Ambulatory Visit: Payer: Self-pay | Admitting: Hematology and Oncology

## 2012-04-14 LAB — PATHOLOGY REPORT

## 2012-05-05 LAB — CBC CANCER CENTER
Basophil #: 0 x10 3/mm (ref 0.0–0.1)
Basophil %: 0.6 %
Eosinophil #: 0.2 x10 3/mm (ref 0.0–0.7)
HCT: 38.5 % (ref 35.0–47.0)
HGB: 12.7 g/dL (ref 12.0–16.0)
Lymphocyte #: 2.2 x10 3/mm (ref 1.0–3.6)
MCH: 25.9 pg — ABNORMAL LOW (ref 26.0–34.0)
Neutrophil %: 61 %
RDW: 16.5 % — ABNORMAL HIGH (ref 11.5–14.5)

## 2012-05-12 ENCOUNTER — Ambulatory Visit: Payer: Self-pay | Admitting: Hematology and Oncology

## 2012-05-12 LAB — CBC CANCER CENTER
Basophil #: 0.1 x10 3/mm (ref 0.0–0.1)
Basophil %: 1 %
Eosinophil #: 0.2 x10 3/mm (ref 0.0–0.7)
HCT: 39.3 % (ref 35.0–47.0)
Lymphocyte %: 28.9 %
MCH: 25.3 pg — ABNORMAL LOW (ref 26.0–34.0)
MCHC: 32.4 g/dL (ref 32.0–36.0)
Monocyte #: 0.6 x10 3/mm (ref 0.2–0.9)
Neutrophil %: 59.7 %
Platelet: 199 x10 3/mm (ref 150–440)
RBC: 5.04 10*6/uL (ref 3.80–5.20)
WBC: 7 x10 3/mm (ref 3.6–11.0)

## 2012-05-19 LAB — CBC CANCER CENTER
Basophil %: 0.8 %
Eosinophil #: 0.2 x10 3/mm (ref 0.0–0.7)
Eosinophil %: 2.4 %
HGB: 12.8 g/dL (ref 12.0–16.0)
Lymphocyte #: 1.6 x10 3/mm (ref 1.0–3.6)
Lymphocyte %: 24.7 %
MCH: 25.5 pg — ABNORMAL LOW (ref 26.0–34.0)
MCHC: 32.5 g/dL (ref 32.0–36.0)
Monocyte #: 0.5 x10 3/mm (ref 0.2–0.9)
RBC: 5.02 10*6/uL (ref 3.80–5.20)
WBC: 6.7 x10 3/mm (ref 3.6–11.0)

## 2012-05-26 LAB — CBC CANCER CENTER
Basophil #: 0.1 x10 3/mm (ref 0.0–0.1)
Basophil %: 1 %
Eosinophil #: 0.2 x10 3/mm (ref 0.0–0.7)
HCT: 36.9 % (ref 35.0–47.0)
HGB: 12.1 g/dL (ref 12.0–16.0)
Lymphocyte #: 1.4 x10 3/mm (ref 1.0–3.6)
Lymphocyte %: 22.2 %
MCHC: 32.8 g/dL (ref 32.0–36.0)
MCV: 79 fL — ABNORMAL LOW (ref 80–100)
Monocyte %: 9.6 %
Neutrophil #: 3.9 x10 3/mm (ref 1.4–6.5)
Neutrophil %: 64.5 %
Platelet: 155 x10 3/mm (ref 150–440)
RBC: 4.7 10*6/uL (ref 3.80–5.20)
RDW: 15.8 % — ABNORMAL HIGH (ref 11.5–14.5)

## 2012-06-02 LAB — CBC CANCER CENTER
Basophil #: 0 x10 3/mm (ref 0.0–0.1)
Basophil %: 0.7 %
Eosinophil #: 0.2 x10 3/mm (ref 0.0–0.7)
Eosinophil %: 2.6 %
HCT: 37.5 % (ref 35.0–47.0)
HGB: 12.1 g/dL (ref 12.0–16.0)
Lymphocyte %: 24 %
MCH: 25.7 pg — ABNORMAL LOW (ref 26.0–34.0)
MCHC: 32.4 g/dL (ref 32.0–36.0)
MCV: 79 fL — ABNORMAL LOW (ref 80–100)
Monocyte %: 7.3 %
Neutrophil #: 4 x10 3/mm (ref 1.4–6.5)
Neutrophil %: 65.4 %
RBC: 4.73 10*6/uL (ref 3.80–5.20)
RDW: 16.3 % — ABNORMAL HIGH (ref 11.5–14.5)
WBC: 6.1 x10 3/mm (ref 3.6–11.0)

## 2012-06-09 LAB — CBC CANCER CENTER
Basophil #: 0 x10 3/mm (ref 0.0–0.1)
Basophil %: 0.7 %
Eosinophil #: 0.1 x10 3/mm (ref 0.0–0.7)
Eosinophil %: 2.6 %
MCHC: 32.8 g/dL (ref 32.0–36.0)
Monocyte %: 9.1 %
Neutrophil %: 65.5 %
RBC: 4.78 10*6/uL (ref 3.80–5.20)
RDW: 16.5 % — ABNORMAL HIGH (ref 11.5–14.5)

## 2012-06-12 ENCOUNTER — Ambulatory Visit: Payer: Self-pay | Admitting: Hematology and Oncology

## 2012-07-05 LAB — CBC CANCER CENTER
Basophil #: 0 x10 3/mm (ref 0.0–0.1)
Eosinophil %: 2.1 %
HCT: 38.4 % (ref 35.0–47.0)
HGB: 13 g/dL (ref 12.0–16.0)
MCH: 26.3 pg (ref 26.0–34.0)
MCHC: 33.7 g/dL (ref 32.0–36.0)
MCV: 78 fL — ABNORMAL LOW (ref 80–100)
Monocyte #: 0.5 x10 3/mm (ref 0.2–0.9)
Monocyte %: 8.1 %
Neutrophil #: 3.8 x10 3/mm (ref 1.4–6.5)
Neutrophil %: 65.4 %
Platelet: 172 x10 3/mm (ref 150–440)
WBC: 5.8 x10 3/mm (ref 3.6–11.0)

## 2012-07-05 LAB — COMPREHENSIVE METABOLIC PANEL
Albumin: 3.5 g/dL (ref 3.4–5.0)
Alkaline Phosphatase: 111 U/L (ref 50–136)
Anion Gap: 10 (ref 7–16)
BUN: 13 mg/dL (ref 7–18)
Calcium, Total: 9.3 mg/dL (ref 8.5–10.1)
Co2: 28 mmol/L (ref 21–32)
Creatinine: 1.11 mg/dL (ref 0.60–1.30)
EGFR (Non-African Amer.): 53 — ABNORMAL LOW
SGOT(AST): 31 U/L (ref 15–37)
Sodium: 140 mmol/L (ref 136–145)
Total Protein: 7.1 g/dL (ref 6.4–8.2)

## 2012-07-12 ENCOUNTER — Ambulatory Visit: Payer: Self-pay | Admitting: Hematology and Oncology

## 2012-07-19 LAB — CBC CANCER CENTER
Basophil #: 0.1 x10 3/mm (ref 0.0–0.1)
Basophil %: 0.8 %
Eosinophil #: 0.1 x10 3/mm (ref 0.0–0.7)
HGB: 13 g/dL (ref 12.0–16.0)
MCHC: 33.7 g/dL (ref 32.0–36.0)
Monocyte #: 0.5 x10 3/mm (ref 0.2–0.9)
Monocyte %: 6.8 %
Neutrophil %: 69.3 %
Platelet: 190 x10 3/mm (ref 150–440)
RDW: 16.2 % — ABNORMAL HIGH (ref 11.5–14.5)

## 2012-07-19 LAB — COMPREHENSIVE METABOLIC PANEL
Alkaline Phosphatase: 97 U/L (ref 50–136)
Anion Gap: 9 (ref 7–16)
Calcium, Total: 9.2 mg/dL (ref 8.5–10.1)
Chloride: 103 mmol/L (ref 98–107)
Creatinine: 1.13 mg/dL (ref 0.60–1.30)
EGFR (African American): >60
EGFR (Non-African Amer.): 52 — ABNORMAL LOW
Glucose: 183 mg/dL — ABNORMAL HIGH (ref 65–99)
Potassium: 3.9 mmol/L (ref 3.5–5.1)
SGOT(AST): 22 U/L (ref 15–37)
SGPT (ALT): 44 U/L (ref 12–78)
Sodium: 140 mmol/L (ref 136–145)
Total Protein: 7.2 g/dL (ref 6.4–8.2)

## 2012-08-12 ENCOUNTER — Ambulatory Visit: Payer: Self-pay | Admitting: Hematology and Oncology

## 2012-08-18 ENCOUNTER — Ambulatory Visit: Payer: Self-pay | Admitting: Family Medicine

## 2012-09-12 ENCOUNTER — Ambulatory Visit: Payer: Self-pay | Admitting: Hematology and Oncology

## 2012-11-22 ENCOUNTER — Ambulatory Visit: Payer: Self-pay | Admitting: Hematology and Oncology

## 2012-11-22 LAB — COMPREHENSIVE METABOLIC PANEL
Albumin: 3.5 g/dL (ref 3.4–5.0)
Alkaline Phosphatase: 92 U/L (ref 50–136)
Anion Gap: 10 (ref 7–16)
BUN: 16 mg/dL (ref 7–18)
Chloride: 101 mmol/L (ref 98–107)
EGFR (African American): >60
EGFR (Non-African Amer.): >60
Glucose: 180 mg/dL — ABNORMAL HIGH (ref 65–99)
Osmolality: 283 (ref 275–301)
Potassium: 4 mmol/L (ref 3.5–5.1)
SGOT(AST): 17 U/L (ref 15–37)
SGPT (ALT): 39 U/L (ref 12–78)

## 2012-11-22 LAB — CBC CANCER CENTER
Basophil #: 0.1 x10 3/mm (ref 0.0–0.1)
Basophil %: 1 %
Eosinophil #: 0.1 x10 3/mm (ref 0.0–0.7)
HCT: 39.1 % (ref 35.0–47.0)
Lymphocyte #: 2.1 x10 3/mm (ref 1.0–3.6)
MCH: 26.5 pg (ref 26.0–34.0)
MCV: 80 fL (ref 80–100)
Neutrophil #: 4 x10 3/mm (ref 1.4–6.5)
Platelet: 203 x10 3/mm (ref 150–440)

## 2012-12-12 ENCOUNTER — Ambulatory Visit: Payer: Self-pay | Admitting: Hematology and Oncology

## 2013-01-23 ENCOUNTER — Ambulatory Visit: Payer: Self-pay | Admitting: Hematology and Oncology

## 2013-02-12 ENCOUNTER — Ambulatory Visit: Payer: Self-pay | Admitting: Hematology and Oncology

## 2013-02-13 DIAGNOSIS — E782 Mixed hyperlipidemia: Secondary | ICD-10-CM | POA: Insufficient documentation

## 2013-02-13 DIAGNOSIS — I10 Essential (primary) hypertension: Secondary | ICD-10-CM | POA: Insufficient documentation

## 2013-02-13 DIAGNOSIS — E039 Hypothyroidism, unspecified: Secondary | ICD-10-CM | POA: Insufficient documentation

## 2013-02-13 DIAGNOSIS — Z853 Personal history of malignant neoplasm of breast: Secondary | ICD-10-CM | POA: Insufficient documentation

## 2013-02-13 DIAGNOSIS — E119 Type 2 diabetes mellitus without complications: Secondary | ICD-10-CM | POA: Insufficient documentation

## 2013-02-22 LAB — CANCER ANTIGEN 27.29: CA 27.29: 25.8 U/mL (ref 0.0–38.6)

## 2013-03-12 ENCOUNTER — Ambulatory Visit: Payer: Self-pay | Admitting: Hematology and Oncology

## 2013-04-13 ENCOUNTER — Ambulatory Visit: Payer: Self-pay | Admitting: Hematology and Oncology

## 2013-04-16 IMAGING — MG MM BREAST SURGICAL SPECIMEN
1 series · 1 of 1 positions shown · non-contrast
Comparison: Needle wire localization performed same day.

REASON FOR EXAM: RIGHT
COMMENTS:

PROCEDURE:     MAM - MAM BREAST SPECIMEN  - March 29, 2012  [DATE]
RESULT:

[R CC]
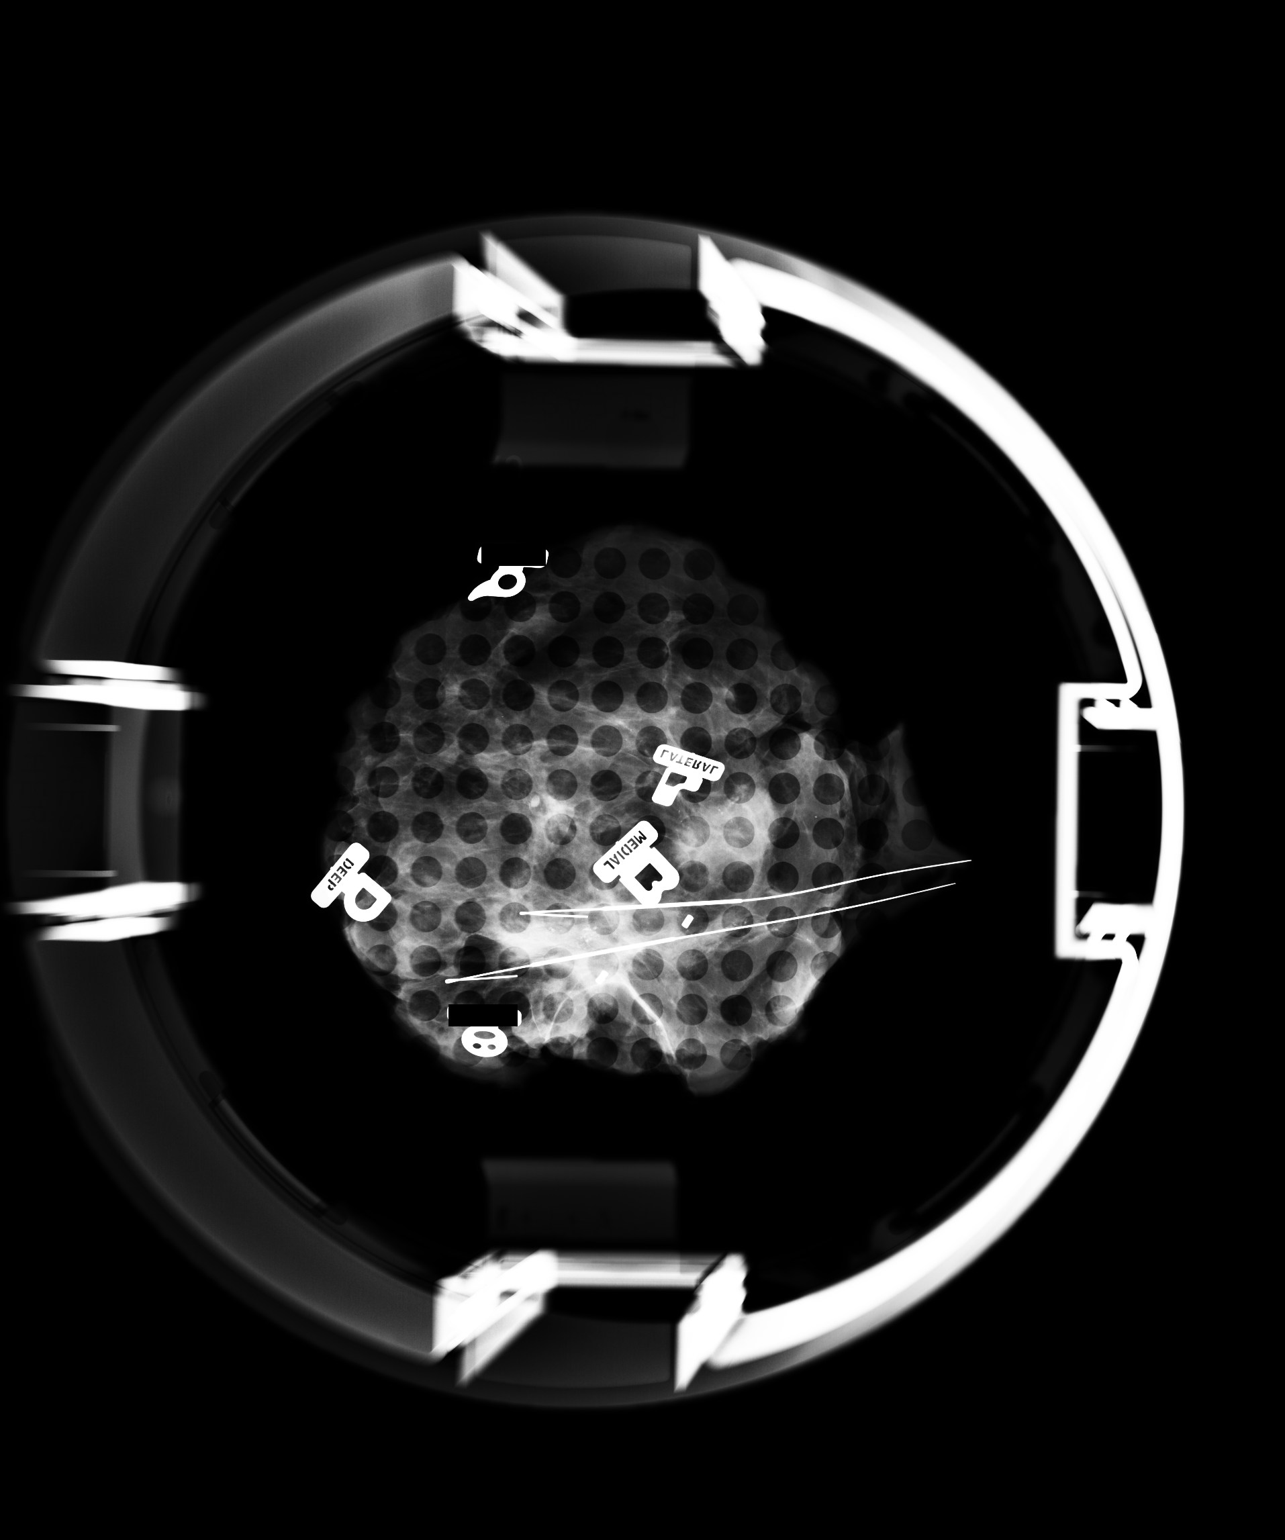

[1 of 1 positions shown; findings below may reference images not displayed]

FINDINGS: A single specimen radiograph was admitted. Two wires are present within the
specimen. Two surgical clips are present in the specimen. There are multiple
microcalcifications in the specimen.
IMPRESSION: 1. The biopsy clip markers and multiple microcalcifications are present
within the specimen.

## 2013-06-01 ENCOUNTER — Ambulatory Visit: Payer: Self-pay | Admitting: Hematology and Oncology

## 2013-06-01 LAB — CBC CANCER CENTER
Basophil #: 0.1 x10 3/mm (ref 0.0–0.1)
Basophil %: 1.2 %
Eosinophil #: 0.1 x10 3/mm (ref 0.0–0.7)
Eosinophil %: 1.2 %
HCT: 38.5 % (ref 35.0–47.0)
HGB: 12.7 g/dL (ref 12.0–16.0)
Lymphocyte #: 2 x10 3/mm (ref 1.0–3.6)
Lymphocyte %: 28.1 %
MCH: 25.4 pg — ABNORMAL LOW (ref 26.0–34.0)
MCHC: 33 g/dL (ref 32.0–36.0)
MCV: 77 fL — AB (ref 80–100)
Monocyte #: 0.4 x10 3/mm (ref 0.2–0.9)
Monocyte %: 6.3 %
NEUTROS ABS: 4.4 x10 3/mm (ref 1.4–6.5)
Neutrophil %: 63.2 %
Platelet: 201 x10 3/mm (ref 150–440)
RBC: 5 10*6/uL (ref 3.80–5.20)
RDW: 16.3 % — ABNORMAL HIGH (ref 11.5–14.5)
WBC: 7 x10 3/mm (ref 3.6–11.0)

## 2013-06-01 LAB — COMPREHENSIVE METABOLIC PANEL
Albumin: 3.7 g/dL (ref 3.4–5.0)
Alkaline Phosphatase: 90 U/L
Anion Gap: 9 (ref 7–16)
BUN: 14 mg/dL (ref 7–18)
Bilirubin,Total: 0.8 mg/dL (ref 0.2–1.0)
CHLORIDE: 103 mmol/L (ref 98–107)
Calcium, Total: 9.1 mg/dL (ref 8.5–10.1)
Co2: 28 mmol/L (ref 21–32)
Creatinine: 0.9 mg/dL (ref 0.60–1.30)
Glucose: 171 mg/dL — ABNORMAL HIGH (ref 65–99)
Osmolality: 284 (ref 275–301)
Potassium: 4.5 mmol/L (ref 3.5–5.1)
SGOT(AST): 20 U/L (ref 15–37)
SGPT (ALT): 45 U/L (ref 12–78)
SODIUM: 140 mmol/L (ref 136–145)
Total Protein: 7.4 g/dL (ref 6.4–8.2)

## 2013-06-02 LAB — CANCER ANTIGEN 27.29: CA 27.29: 29.5 U/mL (ref 0.0–38.6)

## 2013-06-12 ENCOUNTER — Ambulatory Visit: Payer: Self-pay | Admitting: Hematology and Oncology

## 2013-08-17 DIAGNOSIS — I2510 Atherosclerotic heart disease of native coronary artery without angina pectoris: Secondary | ICD-10-CM | POA: Insufficient documentation

## 2013-08-17 DIAGNOSIS — I214 Non-ST elevation (NSTEMI) myocardial infarction: Secondary | ICD-10-CM | POA: Insufficient documentation

## 2013-09-28 ENCOUNTER — Ambulatory Visit: Payer: Self-pay | Admitting: Internal Medicine

## 2013-09-28 LAB — CBC CANCER CENTER
Basophil #: 0.1 x10 3/mm (ref 0.0–0.1)
Basophil %: 1 %
Eosinophil #: 0.1 x10 3/mm (ref 0.0–0.7)
Eosinophil %: 2 %
HCT: 40 % (ref 35.0–47.0)
HGB: 12.9 g/dL (ref 12.0–16.0)
LYMPHS PCT: 31.9 %
Lymphocyte #: 1.8 x10 3/mm (ref 1.0–3.6)
MCH: 25.4 pg — ABNORMAL LOW (ref 26.0–34.0)
MCHC: 32.1 g/dL (ref 32.0–36.0)
MCV: 79 fL — AB (ref 80–100)
MONO ABS: 0.4 x10 3/mm (ref 0.2–0.9)
MONOS PCT: 7 %
Neutrophil #: 3.3 x10 3/mm (ref 1.4–6.5)
Neutrophil %: 58.1 %
Platelet: 210 x10 3/mm (ref 150–440)
RBC: 5.06 10*6/uL (ref 3.80–5.20)
RDW: 16.8 % — ABNORMAL HIGH (ref 11.5–14.5)
WBC: 5.7 x10 3/mm (ref 3.6–11.0)

## 2013-09-28 LAB — COMPREHENSIVE METABOLIC PANEL
ALBUMIN: 3.6 g/dL (ref 3.4–5.0)
Alkaline Phosphatase: 85 U/L
Anion Gap: 12 (ref 7–16)
BUN: 19 mg/dL — AB (ref 7–18)
Bilirubin,Total: 0.6 mg/dL (ref 0.2–1.0)
CALCIUM: 9.2 mg/dL (ref 8.5–10.1)
Chloride: 101 mmol/L (ref 98–107)
Co2: 23 mmol/L (ref 21–32)
Creatinine: 1.04 mg/dL (ref 0.60–1.30)
EGFR (African American): >60
GFR CALC NON AF AMER: 57 — AB
Glucose: 159 mg/dL — ABNORMAL HIGH (ref 65–99)
OSMOLALITY: 278 (ref 275–301)
Potassium: 3.9 mmol/L (ref 3.5–5.1)
SGOT(AST): 17 U/L (ref 15–37)
SGPT (ALT): 34 U/L
Sodium: 136 mmol/L (ref 136–145)
Total Protein: 7.3 g/dL (ref 6.4–8.2)

## 2013-10-02 LAB — CANCER ANTIGEN 27.29: CA 27.29: 27.1 U/mL (ref 0.0–38.6)

## 2013-10-10 ENCOUNTER — Emergency Department: Payer: Self-pay | Admitting: Internal Medicine

## 2013-10-10 LAB — COMPREHENSIVE METABOLIC PANEL
ALBUMIN: 3.5 g/dL (ref 3.4–5.0)
ALK PHOS: 81 U/L
Anion Gap: 8 (ref 7–16)
BUN: 14 mg/dL (ref 7–18)
Bilirubin,Total: 0.5 mg/dL (ref 0.2–1.0)
CALCIUM: 9.3 mg/dL (ref 8.5–10.1)
CREATININE: 0.95 mg/dL (ref 0.60–1.30)
Chloride: 103 mmol/L (ref 98–107)
Co2: 26 mmol/L (ref 21–32)
EGFR (African American): >60
EGFR (Non-African Amer.): >60
GLUCOSE: 97 mg/dL (ref 65–99)
OSMOLALITY: 274 (ref 275–301)
Potassium: 4.3 mmol/L (ref 3.5–5.1)
SGOT(AST): 22 U/L (ref 15–37)
SGPT (ALT): 33 U/L
Sodium: 137 mmol/L (ref 136–145)
Total Protein: 7.3 g/dL (ref 6.4–8.2)

## 2013-10-10 LAB — CBC
HCT: 39 % (ref 35.0–47.0)
HGB: 12.2 g/dL (ref 12.0–16.0)
MCH: 24.9 pg — ABNORMAL LOW (ref 26.0–34.0)
MCHC: 31.3 g/dL — ABNORMAL LOW (ref 32.0–36.0)
MCV: 80 fL (ref 80–100)
Platelet: 216 10*3/uL (ref 150–440)
RBC: 4.9 10*6/uL (ref 3.80–5.20)
RDW: 16.6 % — ABNORMAL HIGH (ref 11.5–14.5)
WBC: 8.6 10*3/uL (ref 3.6–11.0)

## 2013-10-10 LAB — HEMOGLOBIN A1C: Hemoglobin A1C: 6.8 % — ABNORMAL HIGH (ref 4.2–6.3)

## 2013-10-12 ENCOUNTER — Ambulatory Visit: Payer: Self-pay | Admitting: Internal Medicine

## 2014-01-24 ENCOUNTER — Ambulatory Visit: Payer: Self-pay | Admitting: Hematology and Oncology

## 2014-01-30 LAB — COMPREHENSIVE METABOLIC PANEL
ALBUMIN: 3.7 g/dL (ref 3.4–5.0)
ANION GAP: 11 (ref 7–16)
AST: 16 U/L (ref 15–37)
Alkaline Phosphatase: 84 U/L
BILIRUBIN TOTAL: 0.4 mg/dL (ref 0.2–1.0)
BUN: 20 mg/dL — AB (ref 7–18)
CHLORIDE: 100 mmol/L (ref 98–107)
CREATININE: 1.12 mg/dL (ref 0.60–1.30)
Calcium, Total: 8.9 mg/dL (ref 8.5–10.1)
Co2: 28 mmol/L (ref 21–32)
GFR CALC NON AF AMER: 52 — AB
GLUCOSE: 135 mg/dL — AB (ref 65–99)
OSMOLALITY: 282 (ref 275–301)
Potassium: 3.8 mmol/L (ref 3.5–5.1)
SGPT (ALT): 43 U/L
Sodium: 139 mmol/L (ref 136–145)
Total Protein: 7.4 g/dL (ref 6.4–8.2)

## 2014-01-30 LAB — CBC CANCER CENTER
BASOS PCT: 0.8 %
Basophil #: 0.1 x10 3/mm (ref 0.0–0.1)
EOS PCT: 2.3 %
Eosinophil #: 0.2 x10 3/mm (ref 0.0–0.7)
HCT: 41.5 % (ref 35.0–47.0)
HGB: 13.4 g/dL (ref 12.0–16.0)
Lymphocyte #: 2.6 x10 3/mm (ref 1.0–3.6)
Lymphocyte %: 29.2 %
MCH: 25.6 pg — ABNORMAL LOW (ref 26.0–34.0)
MCHC: 32.4 g/dL (ref 32.0–36.0)
MCV: 79 fL — AB (ref 80–100)
MONO ABS: 0.5 x10 3/mm (ref 0.2–0.9)
MONOS PCT: 5.8 %
Neutrophil #: 5.5 x10 3/mm (ref 1.4–6.5)
Neutrophil %: 61.9 %
PLATELETS: 237 x10 3/mm (ref 150–440)
RBC: 5.24 10*6/uL — AB (ref 3.80–5.20)
RDW: 16.4 % — ABNORMAL HIGH (ref 11.5–14.5)
WBC: 8.9 x10 3/mm (ref 3.6–11.0)

## 2014-02-12 ENCOUNTER — Ambulatory Visit: Payer: Self-pay | Admitting: Hematology and Oncology

## 2014-04-18 ENCOUNTER — Ambulatory Visit
Admit: 2014-04-18 | Disposition: A | Discharge status: Home / Self Care | Payer: Self-pay | Attending: Oncology | Admitting: Oncology

## 2014-05-04 NOTE — Consult Note (Signed)
Reason for Visit: This 64 year old Female patient presents to the clinic for initial evaluation of  breast cancer .   Referred by Dr. Tamala Julian.  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 Her biopsy was postponed secondary to an MI with 99% stenosis on angiogram. Patient will wide local excision for 2  separate foci of ductal carcinoma in situ. Both were grade 2 overall. One was 1.7 the other was 0.9 cm. Tumor for ER/PR positive. Closest margin was the medial margin at 0.1 cm. she had reexcision of the superior margin which was clear. She has developed some exaggerated nipple inversion on the right since her surgery and she is having ultrasound for workup of that. Otherwise she is doing well. She specifically denies breast tenderness cough or bone pain.  Past Hx:    Breast Cancer: 2014   Diabetes Mellitus, Type II (NIDD):    GERD - Esophageal Reflux:    Hypothyroid:    HTN:    Right Breast Lumpectomy: 2014   Knee Surgery - Right:    Knee Surgery - Left:    Ruptured Disc Repair in lower back:    Bladder/Rectal tacked:    Partial thyroid removed:    Hysterectomy:    GB Removed:   Past, Family and Social History:  Past Medical History positive   Cardiovascular cardiac catheterization; coronary artery disease; coronary artery stents; hypertension; stents placed in June of 2013   Gastrointestinal GERD   Endocrine diabetes mellitus   Past Surgical History  hysterectomy, partial thyroidectomy, bladder rectal tacking, bilateral knee surgery   Family History positive   Family History Comments sister with breast cancer first cousin with breast cancer and and with rectal cancer   Social History noncontributory   Additional Past Medical and Surgical History seen by herself today   Allergies:   IVP Dye: SOB, Rash  Aspirin: GI Distress  Sulfa drugs: GI Distress  Codeine: Other  Lisinopril: Other  Morphine: Other  Home Meds:  Home Medications: Medication Instructions Status  pantoprazole 40 mg oral delayed release tablet 1 tab(s) orally once a day at lunch Active  metoprolol tartrate 25 mg oral tablet 1 tab(s) orally 2 times a day Active  metformin 1000 mg oral tablet 0.5 tab(s) orally once a day (in the morning) and one tablet (1000 mg) at supper Active  Calcium 600+D 1 tab(s) orally once a day (in the morning) Active  clopidogrel 75 mg oral tablet 1 tab(s) orally once a day Active  aspirin 325 mg oral tablet 1 tab(s) orally once a day Active  Lyrica 50 mg oral capsule 1 cap(s) orally every other day (at bedtime) Active  Omega-3 Fish Oil 1360 mg oral capsule 1 cap(s) orally once a day Active  Nitrostat 0.4 mg sublingual tablet 1 tab(s) sublingual every 5 minutes X 3 doses for chest pain if not relieved CALL 911--DO NOT EXCEED 3 DOSES Active  Norco 325 mg-5 mg oral tablet 1-2 tablets by mouth every 4 hours  as needed for pain Active  atorvastatin 40 mg oral tablet 1 tab(s) orally once a day (at bedtime) Active  hydrochlorothiazide-lisinopril 12.5 mg-10 mg oral tablet 1 tab(s) orally once a day Active  levothyroxine 50 mcg (0.05 mg) oral tablet 1 tab(s) orally once a day Active   Review of Systems:  General negative   Performance Status (ECOG) 0   Skin negative   Breast see HPI   Ophthalmologic negative   ENMT negative   Respiratory and Thorax negative   Cardiovascular see HPI   Gastrointestinal negative   Genitourinary  negative   Musculoskeletal negative   Neurological negative   Psychiatric negative   Hematology/Lymphatics negative   Endocrine negative   Allergic/Immunologic negative   Review of Systems   according to nurse's notesPatient denies any weight loss, fatigue, weakness, fever, chills or night sweats. Patient denies any loss of vision, blurred vision. Patient denies any ringing  of the ears or hearing loss. No irregular heartbeat. Patient denies heart murmur or history of fainting. Patient denies any chest pain or pain radiating to her upper extremities. Patient denies any shortness of breath, difficulty breathing at night, cough or hemoptysis. Patient denies any swelling in the lower legs. Patient denies any nausea vomiting, vomiting of blood, or coffee ground material in the vomitus. Patient denies any stomach pain. Patient states has had normal bowel movements no significant constipation or diarrhea. Patient denies any dysuria, hematuria or significant nocturia. Patient denies any problems walking, swelling in the joints or loss of balance. Patient denies any skin changes, loss of hair or loss of weight. Patient denies any excessive worrying or anxiety or significant depression. Patient denies any problems with insomnia. Patient denies excessive thirst, polyuria, polydipsia. Patient denies any swollen glands, patient denies easy bruising or easy bleeding. Patient denies any recent infections, allergies or URI. Patient "s visual fields have not changed significantly in recent time.  Nursing Notes:  Nursing Vital Signs and Chemo Nursing Nursing Notes: *CC Vital Signs Flowsheet:   07-Apr-14 10:28  Temp Temperature 97.4  Pulse Pulse 76  Respirations Respirations 20  SBP SBP 181  DBP DBP 83  Pain Scale (0-10)  0  Current Weight (kg) (kg) 89.1  Height (cm) centimeters 153.5  BSA (m2) 1.8   Physical Exam:  General/Skin/HEENT:  General normal   Skin normal   Eyes normal   ENMT normal    Head and Neck normal   Additional PE well-developed well-nourished female in NAD. Lungs are clear to A&P cardiac examination shows regular rate and rhythm. She status post right breast wide local excision with incision healing well. There is slight nipple inversion on the right. No dominant mass or nodularity is noted in either breast into position examined. No axillary or supraclavicular adenopathy is appreciated.   Breasts/Resp/CV/GI/GU:  Respiratory and Thorax normal   Cardiovascular normal   Gastrointestinal normal   Genitourinary normal   MS/Neuro/Psych/Lymph:  Musculoskeletal normal   Neurological normal   Lymphatics normal   Other Results:  Radiology Results: LabUnknown:    27-Jan-14 13:40, Digital Unilateral Right Breast  PACS [place]:  Digital Unilateral Right Breast   REASON FOR EXAM:    RT BR MICROCALCS  COMMENTS:       PROCEDURE: MAM - MAM DGTL UNI MAM RT BREAST W/CAD  - Feb 08 2012  1:40PM     RESULT: Unilateral right breast mammogram dated 02/08/2012.    Findings: BREAST COMPOSITION: The breast composition is SCATTERED  FIBROGLANDULAR TISSUE (glandular tissue is 25-50%)    The previous described calcifications within the superior lateral portion   of the right breast have increased when compared to the previous study. A   second area of indeterminate suspicious calcifications appear to have   developed within the lateral superior portion of the breast inferior   lateral to the calcifications of interest. Surgical consultation     recommended.    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 Believe she would benefit from whole breast radiation up to 5000 cGy over 5 weeks. Would also boost or scar another 1600 cGy based on the close medial margin. Patient will also be a candidate for tamoxifen therapy after completion of radiation. Risks and benefits of treatment including irritation of the skin, fatigue, thickening of the breast and possible alteration of her blood counts were all explained in detail to the patient. Patient seems to comprehend my treatment plan well. I have set her up for CT simulation later this week.  I would like to take this opportunity to thank you for allowing me to continue to participate in this patient's care.  CC Referral:  cc: Dr. Tamala Julian, Dr. Richarda Overlie   Electronic Signatures: Baruch Gouty, Roda Shutters (MD)  (Signed 07-Apr-14 14:52)  Authored: HPI, Diagnosis, Past Hx, PFSH, Allergies, Home Meds, ROS, Nursing Notes, Physical Exam, Other Results, Relevent Results, Encounter Assessment and Plan, CC Referring Physician   Last Updated: 07-Apr-14 14:52 by Armstead Peaks (MD)

## 2014-05-04 NOTE — Op Note (Signed)
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   PROCEDURE: Right partial mastectomy.   SURGEON: Rochel Brome, M.D.   ANESTHESIA: General.   INDICATIONS: This 64 year old female recently had a mammogram depicting microcalcifications in the lateral aspect of the right breast. She had core needle biopsies of 2 sites, both of which demonstrated ductal carcinoma in situ. She had preoperative x-ray needle localization of the 2 sites, which were just about 2 cm apart, and surgery was recommended for definitive treatment.   DESCRIPTION OF PROCEDURE: The patient was placed on the operating table in the supine position under general anesthesia. The dressing was removed from the right breast, exposing the Kopan's wire, both of which were at approximately the 9-o'clock position of the breast. I also reviewed her mammogram images, seeing the location of the wires in comparison with the calcifications. Localization appeared to be good. The wires were cut 2 cm from the skin. The right breast was prepared with ChloraPrep, draped in a sterile manner.   A curvilinear incision was made from approximately 8-o'clock to 10-o'clock position of the right breast. The skin ellipse removed was approximately 1 cm wide. The dissection was carried down around the wire markers, palpating some mild degree of density, and a wide excision was carried out using electrocautery for hemostasis, and the specimen was dissected down to as far as the deep fascia. Next, the 10-o'clock edge of the skin ellipse was tagged with a stitch. Margin maps were sutured to the specimen to mark the cranial, caudal, medial, lateral and deep margins and submitted for specimen mammogram and pathology. The wound was inspected.  There was no remaining or visible mass within the wound. Several small bleeding points were cauterized. Several clamped vessels were suture-ligated with 4-0 chromic. Hemostasis appeared to be intact. The deeper tissues were infiltrated with 0.5% Sensorcaine with epinephrine and subcutaneous tissues infiltrated as well. The subcutaneous tissues were closed with interrupted 4-0 chromic, and the skin was closed with running 4-0 Monocryl subcuticular suture.   I awaited the pathologist's report, who called back and indicated that the superior margin appeared to be quite close and recommended re-excision of the superior margin. Therefore, the wound was opened, removing the suture material, and identified the superior margin; and a portion of tissue which was circular and approximately 4 cm in diameter was removed, and approximately 1 cm in thickness, and placed a stitch to mark the new margin and also placed the old margin on Telfa and submitted for pathology. Next, the wound was further inspected. Some additional Sensorcaine with epinephrine was infiltrated around the new operative site. Several small bleeding points were cauterized. Hemostasis was subsequently intact. Next, the subcutaneous tissues were closed with 4-0 chromic interrupted sutures. The skin was closed with running 4-0 Monocryl subcuticular suture and Dermabond. The patient tolerated the surgery satisfactorily and was prepared for transfer to the recovery room.    ____________________________ Lenna Sciara. Rochel Brome, MD jws:dm D: 03/29/2012 14:47:00 ET T: 03/29/2012 15:12:46 ET JOB#: [id number]  cc: Loreli Dollar, MD, <Dictator> Loreli Dollar MD ELECTRONICALLY SIGNED 03/30/2012 18:04

## 2014-05-06 NOTE — Discharge Summary (Signed)
PATIENT NAME:  Brittney Ray, Brittney Ray MR#:  [id number] DATE OF BIRTH:  13-Nov-1950  DATE OF ADMISSION:  06/23/2011 DATE OF DISCHARGE:  06/25/2011  DISCHARGE DIAGNOSES:  1. Non-ST elevation myocardial infarction status post catheterization and stent placement.  2. Hypertension.  3. Hyperlipidemia.  4. Adult-onset diabetes.  5. Hypothyroidism.   DISCHARGE MEDICATIONS:  1. Levothyroxine 50 mcg p.o. daily.  2. Metformin 500 mg p.o. b.i.d.  3. Hydrochlorothiazide/lisinopril 12.5/10 mg p.o. daily.  4. Pantoprazole 40 mg p.o. daily.  5. Calcium/vitamin D as directed.  6. Vitamin D3 as directed.  7. Omega capsules as directed.  8. Effient 10 mg p.o. daily.  9. Metoprolol 12.5 mg p.o. b.i.d.  10. Nitroglycerin 0.4 mg 1 tab sublingual q.5 minutes x3 p.r.n. for chest pain.  11. Lipitor 40 mg p.o. at bedtime.  12. Aspirin 325 mg p.o. daily.   CONSULT: Dr. Ubaldo Glassing, cardiology.   PROCEDURE: Cardiac catheterization on 06/24/2010 conveyed 99% stenosis of the mid circumflex status post drug-eluting stent placement.   LABORATORY, DIAGNOSTIC AND RADIOLOGICAL DATA: Pertinent labs on day of discharge: Sodium 140, potassium 4.1, creatinine 0.88, white blood cell count 10.5, hemoglobin 12.2, platelets 181.    BRIEF HOSPITAL COURSE: This is a 64 year old female who was brought in due to chest discomfort.  1. NSTEMI. Patient was found to have elevated cardiac enzymes consistent with NSTEMI. Was seen by Dr. Dr. Ubaldo Glassing underwent cardiac catheterization which showed 99% stenosis of the mid circumflex, drug-eluting stent was placed. Patient was placed on aspirin and Effient. Started on metoprolol, Lipitor as well. Status post procedure she had no complications, no bleeding issues and chest pain has improved. Will continue on the current regimen per Dr. Bethanne Ginger discretion.  2. Hypertension. Remained stable at this time. Will continue her home regimen with the addition of metoprolol 12.5 b.i.d.  3. Adult onset diabetes.  Continue her home regimen of metformin.  4. Hyperlipidemia, previously diet controlled. Will now be started on Lipitor 40 mg daily.   DISPOSITION: She is in stable condition to be discharged to home.   FOLLOW UP: Follow up with Dr. Netty Starring in one week. Follow up with Dr. Ubaldo Glassing in two weeks.  ____________________________ Dion Body, MD kl:cms D: 06/25/2011 12:44:28 ET T: 06/25/2011 14:03:58 ET JOB#: [id number]  cc: Dion Body, MD, <Dictator>  Dion Body MD ELECTRONICALLY SIGNED 06/26/2011 7:57

## 2014-05-06 NOTE — Consult Note (Signed)
General Aspect patient is a 64 year old female with history of hyperlipidemia, hypertension and family history of heart disease who presented to the emergency room with a several day history of intermittent chest pain. Patient states she began noting the chest pain beginning Sunday. The episodes of pain were mid sternal. The episodes would last approximately 2-3 hours before resolving. There was no exertional component. On day of admission, patient had a severe episode of pain radiating to her left shoulder and arm. This was associated with mild diaphoresis. She presented to the emergency room where electrocardiogram revealed inferior Q waves with no injury current. Her serum troponin was 6.4. She was given beta blockers, aspirin and nitrates with improvement. She is currently significantly improved with hemodynamic stability but still has some chest discomfort.the patient has a history of hiatal hernia but states this discomfort is somewhat different.   Physical Exam:   GEN well nourished, no acute distress    HEENT PERRL, hearing intact to voice    NECK supple    RESP normal resp effort  clear BS    CARD Regular rate and rhythm  Murmur    Murmur Systolic    Systolic Murmur axilla    ABD denies tenderness  normal BS    LYMPH negative neck, negative axillae    EXTR negative cyanosis/clubbing, negative edema    SKIN normal to palpation    NEURO cranial nerves intact, motor/sensory function intact    PSYCH A+O to time, place, person   Review of Systems:   Subjective/Chief Complaint chest pain    General: No Complaints    Skin: No Complaints    ENT: No Complaints    Eyes: No Complaints    Neck: No Complaints    Respiratory: No Complaints    Cardiovascular: Chest pain or discomfort  Tightness    Gastrointestinal: No Complaints    Genitourinary: No Complaints    Vascular: No Complaints    Musculoskeletal: No Complaints    Neurologic: No Complaints     Hematologic: No Complaints    Endocrine: No Complaints    Psychiatric: No Complaints    Review of Systems: All other systems were reviewed and found to be negative    Medications/Allergies Reviewed Medications/Allergies reviewed     Diabetes Mellitus, Type II (NIDD):    GERD - Esophageal Reflux:    Hypothyroid:    HTN:    Knee Surgery - Right:    Knee Surgery - Left:    Ruptured Disc Repair in lower back:    Bladder/Rectal tacked:    Partial thyroid removed:    Hysterectomy:    GB Removed:   Home Medications: Medication Instructions Status  levothyroxine 50 mcg (0.05 mg) oral tablet 1 tab(s) orally once a day Active  metformin 500 mg oral tablet 1 tab(s) orally 2 times a day Active  hydrochlorothiazide-lisinopril 12.5 mg-10 mg oral tablet 1 tab(s) orally once a day Active  pantoprazole 40 mg oral delayed release tablet 1 tab(s) orally once a day Active  Calcarb with D 600 mg-400 intl units oral tablet 1 tab(s) orally once a day Active  Vitamin D3 1000 intl units oral tablet 1 tab(s) orally once a day Active  Omega Essentials 667 mg oral capsule 2 cap(s) orally once a day Active   EKG:   EKG NSR    Interpretation inferior Q waves with nonspecific ST-T wave change    IVP Dye: SOB, Rash  Sulfa drugs: GI Distress  Aspirin: GI Distress  Codeine: Other  Lisinopril: Other  Morphine: Other    Impression patient is a 64 year old female with history of diabetes, hypertension, hyperlipidemia and family history of heart disease who is admitted after presenting to the emergency room with several day history of midsternal chest pain. She is ruled in for a non-ST elevation myocardial infarction. She is currently hemodynamically stable with improvement in her symptoms. Her EKG shows no injury current but does suggest inferior Q waves. Given her risk factors, rest chest pain and elevated serum troponin, will need to assess her coronary anatomy to guide further therapy. Risk  and benefits a left cardiac catheterization were explained to the patient and she agrees to proceed. She does have a history of IV contrast allergy with a rash and shortness of breath. She will need premedication prior to IV contrast.    Plan 1. Continue with current medications including aspirin, beta blockers, IV heparin and topical nitrates 2. Premedicate with prednisone, ranitidine. 3. Proceed left cardiac catheterization to evaluate coronary anatomy to guide further therapy the patient with non-ST elevation myocardial infarction 4. Further recommendations after cardiac catheterization data is complete 5. Echocardiogram to evaluate her murmur.   Electronic Signatures: Teodoro Spray (MD)  (Signed 11-Jun-13 23:48)  Authored: General Aspect/Present Illness, History and Physical Exam, Review of System, Past Medical History, Home Medications, EKG , Allergies, Impression/Plan   Last Updated: 11-Jun-13 23:48 by Teodoro Spray (MD)

## 2014-05-06 NOTE — H&P (Signed)
PATIENT NAME:  Brittney Ray, Brittney Ray MR#:  [id number] DATE OF BIRTH:  05-Mar-1950  DATE OF ADMISSION:  06/23/2011  REFERRING PHYSICIAN:  Dr. Lenise Arena PRIMARY CARE PHYSICIAN:  Dr. Netty Starring  CHIEF COMPLAINT: Chest pain.  HISTORY OF PRESENT ILLNESS:  The patient is a pleasant 63 year old female with past medical history listed below including hypertension, hypercholesterolemia, type 2 diabetes mellitus, and hypothyroidism who presents to the Emergency Department with the above-mentioned chief complaint. The patient states that this past Sunday she woke up with chest pain which lasted on and off the entire day, described as a heaviness sensation in the substernal portion of her chest associated with diaphoresis and shortness of breath as well as nausea. This was worse with walking around and activity/exertion and better with rest, although rest did not lead to complete resolution of her chest pain. She went to [facility] and got some Mylanta which did help her chest pain some, however did not lead to complete relief. Chest pain was initially 10 out of 10 in intensity Sunday. She did not take any other medications for it other than Mylanta. Monday she was still having some chest pain but not as severe and she rested most of the day. Today she was out shopping with her sister and had severe heaviness and chest pain which started to radiate into her left shoulder and was associated with nausea and shortness of breath for which she was brought to the ER by her sister for further evaluation. In the ER, she was noted to have some EKG changes with some ST depressions in leads I and aVL as well as some T-wave inversions in her septal leads and V3, also some inferior Q-wave inversions (although Q  waves were also seen on prior EKG but are now more pronounced). She was also noted to have positive elevated cardiac enzymes and positive troponin consistent with non-STEMI. Thereafter, hospitalist services were contacted for  further evaluation and for hospital admission. Otherwise, the patient is without specific complaints at this time. In the ER, the patient received four baby aspirin and she also received two sublingual nitroglycerin and thereafter became chest pain free. She is currently chest pain free at the time of my evaluation.  PAST SURGICAL HISTORY: 1. Hysterectomy.  2. Cholecystectomy.  3. Laminectomy. 4. Bladder surgery.  5. Partial thyroidectomy. 6. Some type of foot surgery, unspecified. 7. Right knee and left knee surgery.  8. Ruptured disk repair in the lower back.  9. Bladder/rectal tack.   PAST MEDICAL HISTORY:  1. Hypertension.  2. Hypercholesterolemia.  3. Type 2 diabetes mellitus.  4. Gastroesophageal reflux disease.  5. Chest pain admission 07/01/2006 with negative stress test at that time.  6. Hypothyroidism as a result of her partial thyroidectomy.  7. History of allergies.  8. 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   ALLERGIES: Codeine, intravenous pyelogram dye, and sulfa. Aspirin is also listed as an allergy but the patient states that this is not a true allergy and causes GI distress. She has tolerated a dose of aspirin today which was administered in the ER.   HOME MEDICATIONS:  1. Vitamin D 3000 international units daily.  2. Protonix 40 mg p.o. daily.  3. Omega Essential 667, 2 caps p.o. daily.  4. Metformin 500 mg p.o. b.i.d.  5. Levothyroxine 50 mcg daily.  6. HCTZ/lisinopril 12.5/10 mg, 1 tab p.o. daily. 7. Calcium carbonate with D 600 mg p.o. daily.   FAMILY HISTORY: Positive for heart disease.  Father died of massive myocardial infarction at age 80. Mother had coronary artery disease with coronary stenting at age 65.   SOCIAL HISTORY: Negative for tobacco, alcohol, or illicit drug use. The patient is married. She has two children. She is a retired Scientist, water quality. She lives in [place].   REVIEW OF SYSTEMS:  CONSTITUTIONAL: Denies fevers, chills, recent changes in weight or weakness. She has had some chest pain, none presently. HEAD/EYES: Denies headache or blurry vision. ENT: Denies tinnitus, earache, nasal discharge, or sore throat. RESPIRATORY: Had some shortness of breath associated with her chest pain. Denies cough or wheezing. CARDIOVASCULAR: Has had some chest pain. Denies orthopnea or lower extremity edema. Denies heart palpitations. GI: Has had some nausea. Denies vomiting. Has chronic diarrhea which has not recently worsened. Denies abdominal pain, melena, or hematochezia. GU: Denies dysuria or hematuria. ENDOCRINE: Denies heat or cold intolerance. HEME/LYMPH: Denies easy bruising or bleeding. INTEGUMENT: Denies rash or lesions. MUSCULOSKELETAL: Denies any joint pain or muscle weakness currently. NEURO: Denies headache, numbness, weakness, tingling, or dysarthria. PSYCH: Denies depression or anxiety. Denies suicidal or homicidal ideation.   PHYSICAL EXAMINATION:  VITAL SIGNS: Temperature 98.6, pulse 112, improved to 96 in the ER after pain medication, respirations 20, oxygen saturation 95% on room air. Blood pressure 113/63, now 128/65, later checked.   GENERAL: Pleasant female, alert and oriented, not acutely distressed.   HEENT: Normocephalic, atraumatic. Pupils are equal, round and reactive to light accommodation. Extraocular muscles are intact. Anicteric sclerae. Conjunctivae pink. Hearing intact to voice. Nares without drainage.  Oral mucosa moist without lesions.  NECK: Supple with full range of motion. No jugular venous distention, lymphadenopathy, or carotid bruits bilaterally. No palpable nodules in the neck.   CHEST: Normal respiratory effort without use of accessory respiratory muscles. Lungs are clear to auscultation bilaterally without crackles, rales, or wheezes.   CARDIOVASCULAR: S1, S2 positive. Regular rate and rhythm. No murmurs, rubs, or gallops. PMI is non-lateralized.    ABDOMEN: Soft, nontender, nondistended. Normoactive bowel sounds. No hepatosplenomegaly or palpable masses noted. No hernias.   EXTREMITIES: No clubbing, cyanosis, or edema. Pedal pulses are palpable bilaterally.   SKIN: No suspicious rashes. Skin turgor is good.   LYMPH: No cervical lymphadenopathy.   NEUROLOGICAL: Alert and oriented times three. Cranial nerves II-XII grossly intact. No focal deficits.   PSYCH: Pleasant female with appropriate affect.   LABORATORY, DIAGNOSTIC, AND RADIOLOGICAL DATA: Portable chest x-ray: No acute cardiopulmonary abnormalities are noted.   EKG: Normal sinus rhythm, heart rate 96 beats per minute with normal axis, normal intervals, possible old inferior myocardial infarction with Q waves noted in leads II, III, and aVF. More pronounced now in leads III and aVF compared with prior EKG. There are mild ST depressions in leads I and aVL, also T-wave inversion in the septal leads in V3, no ST elevation.  Cardiac enzymes: CK 260, CK-MB 14.2, troponin 6.5. INR 1. PTT 13.4.   CBC within normal limits.   Complete metabolic panel within normal limits except for serum sodium slightly low at 135, serum glucose 158, AST 47, point-of-care testing blood glucose 117.   ASSESSMENT: 64 year old female with past medical history of hypertension, hypercholesterolemia, type 2 diabetes mellitus, gastroesophageal reflux disease, and hypothyroidism here with chest pain and found to have elevated cardiac enzymes and EKG changes with:   IMPRESSION:  1. Non-STEMI.  2. Hypertension.  3. Type 2 diabetes mellitus. 4. Hypercholesterolemia. 5. Hypothyroidism.  6. Gastroesophageal reflux disease.   PLAN:  1. We will  admit the patient to the telemetry unit.  2. We will continue to cycle her cardiac enzymes.  3. We will keep the patient on oxygen, aspirin,  heparin drip, beta blocker, and nitrate therapy for now as much as her blood pressure will allow, and also continue ACE  inhibitor with close monitoring of her blood pressure. Blood pressure is currently normotensive to borderline low. We will also provide some IV fluids for blood pressure support, continue statin therapy. Risk stratify by checking hemoglobin A1c and lipid profile. Monitor her overall clinical status closely. We will keep a close eye on her vital signs.  4. Obtain 2-D echocardiogram and cardiology consultation for further recommendations and to arrange cardiac catheterization for assessment of the patient's coronary anatomy.  5. For hypothyroidism we will continue Synthroid, check TSH, and adjust accordingly.  6. For gastroesophageal reflux disease we will continue PPI therapy.  7. Deep vein thrombosis prophylaxis with heparin. 8. CODE STATUS: FULL CODE.  TIME SPENT ON ADMISSION: Approximately 50 minutes.   ____________________________ Romie Jumper, MD knl:[id number] D: 06/23/2011 15:31:46 ET T: 06/23/2011 15:57:04 ET JOB#: [id number]  cc: Romie Jumper, MD, <Dictator> Dion Body, MD Javier Docker. Ubaldo Glassing, MD Romie Jumper MD ELECTRONICALLY SIGNED 07/14/2011 1:18

## 2014-08-30 ENCOUNTER — Ambulatory Visit: Payer: Medicare Other

## 2014-08-30 ENCOUNTER — Inpatient Hospital Stay: Payer: Medicare Other | Attending: Hematology and Oncology

## 2014-08-30 ENCOUNTER — Encounter: Payer: Self-pay | Admitting: Hematology and Oncology

## 2014-08-30 ENCOUNTER — Inpatient Hospital Stay (HOSPITAL_BASED_OUTPATIENT_CLINIC_OR_DEPARTMENT_OTHER): Payer: Medicare Other | Admitting: Hematology and Oncology

## 2014-08-30 VITALS — BP 151/94 | HR 73 | Temp 98.3°F | Resp 18 | Wt 199.5 lb

## 2014-08-30 DIAGNOSIS — K219 Gastro-esophageal reflux disease without esophagitis: Secondary | ICD-10-CM | POA: Diagnosis not present

## 2014-08-30 DIAGNOSIS — Z7982 Long term (current) use of aspirin: Secondary | ICD-10-CM | POA: Diagnosis not present

## 2014-08-30 DIAGNOSIS — Z79810 Long term (current) use of selective estrogen receptor modulators (SERMs): Secondary | ICD-10-CM | POA: Insufficient documentation

## 2014-08-30 DIAGNOSIS — M8580 Other specified disorders of bone density and structure, unspecified site: Secondary | ICD-10-CM | POA: Diagnosis not present

## 2014-08-30 DIAGNOSIS — E039 Hypothyroidism, unspecified: Secondary | ICD-10-CM | POA: Diagnosis not present

## 2014-08-30 DIAGNOSIS — Z170 Estrogen receptor positive status [ER+]: Secondary | ICD-10-CM

## 2014-08-30 DIAGNOSIS — Z79899 Other long term (current) drug therapy: Secondary | ICD-10-CM | POA: Insufficient documentation

## 2014-08-30 DIAGNOSIS — I252 Old myocardial infarction: Secondary | ICD-10-CM | POA: Insufficient documentation

## 2014-08-30 DIAGNOSIS — I10 Essential (primary) hypertension: Secondary | ICD-10-CM | POA: Insufficient documentation

## 2014-08-30 DIAGNOSIS — E119 Type 2 diabetes mellitus without complications: Secondary | ICD-10-CM | POA: Diagnosis not present

## 2014-08-30 DIAGNOSIS — M25551 Pain in right hip: Secondary | ICD-10-CM

## 2014-08-30 DIAGNOSIS — M1990 Unspecified osteoarthritis, unspecified site: Secondary | ICD-10-CM | POA: Insufficient documentation

## 2014-08-30 DIAGNOSIS — Z803 Family history of malignant neoplasm of breast: Secondary | ICD-10-CM | POA: Diagnosis not present

## 2014-08-30 DIAGNOSIS — D0511 Intraductal carcinoma in situ of right breast: Secondary | ICD-10-CM | POA: Insufficient documentation

## 2014-08-30 DIAGNOSIS — D0590 Unspecified type of carcinoma in situ of unspecified breast: Secondary | ICD-10-CM | POA: Insufficient documentation

## 2014-08-30 DIAGNOSIS — Z923 Personal history of irradiation: Secondary | ICD-10-CM | POA: Diagnosis not present

## 2014-08-30 LAB — CBC WITH DIFFERENTIAL/PLATELET
Basophils Absolute: 0 10*3/uL (ref 0–0.1)
Basophils Relative: 1 %
Eosinophils Absolute: 0.3 10*3/uL (ref 0–0.7)
Eosinophils Relative: 3 %
HCT: 39.1 % (ref 35.0–47.0)
Hemoglobin: 12.9 g/dL (ref 12.0–16.0)
Lymphocytes Relative: 30 %
Lymphs Abs: 2.5 10*3/uL (ref 1.0–3.6)
MCH: 26.1 pg (ref 26.0–34.0)
MCHC: 32.9 g/dL (ref 32.0–36.0)
MCV: 79.3 fL — ABNORMAL LOW (ref 80.0–100.0)
Monocytes Absolute: 0.5 10*3/uL (ref 0.2–0.9)
Monocytes Relative: 6 %
Neutro Abs: 5 10*3/uL (ref 1.4–6.5)
Neutrophils Relative %: 60 %
Platelets: 202 10*3/uL (ref 150–440)
RBC: 4.93 MIL/uL (ref 3.80–5.20)
RDW: 16.3 % — ABNORMAL HIGH (ref 11.5–14.5)
WBC: 8.3 10*3/uL (ref 3.6–11.0)

## 2014-08-30 LAB — COMPREHENSIVE METABOLIC PANEL
ALT: 36 U/L (ref 14–54)
AST: 29 U/L (ref 15–41)
Albumin: 4.3 g/dL (ref 3.5–5.0)
Alkaline Phosphatase: 59 U/L (ref 38–126)
Anion gap: 8 (ref 5–15)
BUN: 21 mg/dL — ABNORMAL HIGH (ref 6–20)
CO2: 24 mmol/L (ref 22–32)
Calcium: 8.6 mg/dL — ABNORMAL LOW (ref 8.9–10.3)
Chloride: 103 mmol/L (ref 101–111)
Creatinine, Ser: 0.91 mg/dL (ref 0.44–1.00)
GFR calc Af Amer: >60 mL/min (ref >60)
GFR calc non Af Amer: >60 mL/min (ref >60)
Glucose, Bld: 95 mg/dL (ref 65–99)
Potassium: 3.8 mmol/L (ref 3.5–5.1)
Sodium: 135 mmol/L (ref 135–145)
Total Bilirubin: 0.8 mg/dL (ref 0.3–1.2)
Total Protein: 7.3 g/dL (ref 6.5–8.1)

## 2014-08-30 NOTE — Progress Notes (Signed)
[facility] day:  08/30/2014  Chief Complaint: Brittney Ray is a 64 y.o. female with DCIS who is seen for reassessment.  HPI:  The patient presented in 07/2011 with new clustered calcifications in the upper outer quadrant of the right breast.  Biopsy was postponed secondary to a myocardial infarction.  She underwent right partial mastectomy for 2 separate foci of DCIS on 03/14/2012.  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  She completed radiation in 11/2012. She tolerated it well. She was on tamoxifen for one year but discontinued secondary to emotional issues (it made her "evil" with severe hot flashes").   She switched to Femara, which also caused hot flashes.  She stopped taking Femara in 05/2013.  The patient was last seen in medical oncology clinic on 01/30/2014 by Dr.Choksi. At that time., she was doing well. She underwent bilateral mammogram on 04/18/2014. Mammogram revealed stable benign postoperative changes.  Recommendation was for follow-up mammogram in one year.  The patient denies any breast concerns. She does not little soreness right side around her incision. She has some hip pain (right greater than left).  She has had a diagnosis of Paget's disease for 4 years. Regarding her  history of myocardial infarction, she has a follow-up with the cardiology on 09/12/2014.  She notes her last bone density study was about 2 years ago. She was recently seen by Dr. Rochel Brome.  Past Medical History  Diagnosis Date  . PONV (postoperative nausea and vomiting)   . H/O hiatal hernia   . GERD (gastroesophageal reflux disease)   . Arthritis     knees  . Hypothyroidism   . Nocturia     3-4 x/night  . Anxiety     panic attacks; no curren med.  Marland Kitchen Heart murmur     states never had any problems  . Hypertension     under control, has been on med. > 10 yrs.  . Paget's bone disease     hips  .  Medial meniscus tear 04/2011    right knee  . Diabetes mellitus     NIDDM    Past Surgical History  Procedure Laterality Date  . Knee surgery  07/31/2009    right  . Abdominal hysterectomy  1978    partial  . Cholecystectomy  11/2005  . Back surgery  04/1990, 1996  . Foot surgery  1992    left  . Thyroid lobectomy  03/1992    left  . Hand surgery  10/1999    nerve repair right thumb  . Knee surgery  2009    left  . Burch procedure  09/1989    with rectal repair    Family History:  Sister had breast cancer at age 26.  A first cousin had breast cancer.  An aunt had rectal cancer.  Social History:  reports that she has never smoked. She has never used smokeless tobacco. She reports that she does not drink alcohol or use illicit drugs.  The patient is alone today.  Allergies:  Allergies  Allergen Reactions  . Contrast Media [Iodinated Diagnostic Agents] Hives and Cough  . Morphine And Related Other (See Comments)    JITTERY  . Codeine Other (See Comments) and Anxiety    Panic Attacks PANIC ATTACKS  . Sulfa Antibiotics Other (See Comments)    STOMACH IRRITATION    Current Medications: Current Outpatient Prescriptions  Medication  Sig Dispense Refill  . albuterol (PROAIR HFA) 108 (90 BASE) MCG/ACT inhaler Inhale 2 Inhalers into the lungs as needed.    Marland Kitchen aspirin 325 MG tablet Take 1 tablet by mouth daily.    . calcium carbonate (OS-CAL) 600 MG TABS Take 600 mg by mouth 2 (two) times daily with a meal.    . cholecalciferol (VITAMIN D) 1000 UNITS tablet Take 1,000 Units by mouth daily.    . clopidogrel (PLAVIX) 75 MG tablet Take 1 tablet by mouth daily.    . Cyanocobalamin (RA VITAMIN B-12 TR) 1000 MCG TBCR Take 1 tablet by mouth daily.    . ferrous sulfate 325 (65 FE) MG tablet Take 1 tablet by mouth daily.    . fish oil-omega-3 fatty acids 1000 MG capsule Take 2 g by mouth daily.    Marland Kitchen glimepiride (AMARYL) 2 MG tablet Take 0.5 tablets by mouth daily.    Marland Kitchen levothyroxine  (SYNTHROID, LEVOTHROID) 50 MCG tablet Take 50 mcg by mouth daily. AM    . lisinopril-hydrochlorothiazide (PRINZIDE,ZESTORETIC) 10-12.5 MG per tablet Take 1 tablet by mouth daily. AM    . metFORMIN (GLUCOPHAGE) 500 MG tablet Take 1,000 mg by mouth 2 (two) times daily.     . metoprolol tartrate (LOPRESSOR) 25 MG tablet Take 0.5 tablets by mouth 2 (two) times daily.    . nitroGLYCERIN (NITROSTAT) 0.4 MG SL tablet Place 1 tablet under the tongue as needed.    . pantoprazole (PROTONIX) 40 MG tablet Take 40 mg by mouth daily before lunch. NOON    . pravastatin (PRAVACHOL) 20 MG tablet Take 1 tablet by mouth daily.     No current facility-administered medications for this visit.    Review of Systems:  GENERAL:  Feels good.  Active.  No fevers, sweats or weight loss. PERFORMANCE STATUS (ECOG):  0 HEENT:  No visual changes, runny nose, sore throat, mouth sores or tenderness. Lungs: No shortness of breath or cough.  No hemoptysis. Cardiac:  No chest pain, palpitations, orthopnea, or PND. Breast:  Notes a little soreness on the right side around the incision.  No masses. GI:  No nausea, vomiting, diarrhea, constipation, melena or hematochezia. GU:  No urgency, frequency, dysuria, or hematuria. Musculoskeletal:  Hip pain (right > left).  Paget's disease.  No back pain.  No muscle tenderness. Extremities:  No pain or swelling. Skin:  No rashes or skin changes. Neuro:  No headache, numbness or weakness, balance or coordination issues. Endocrine: Diabetes.  Thyroid disease on Synthroid.  No hot flashes or night sweats. Psych:  No mood changes, depression or anxiety. Pain:  No focal pain. Review of systems:  All other systems reviewed and found to be negative.  Physical Exam: Blood pressure 151/94, pulse 73, temperature 98.3 F (36.8 C), temperature source Tympanic, resp. rate 18, weight 199 lb 8.3 oz (90.5 kg). GENERAL:  Well developed, well nourished, sitting comfortably in the exam room in no  acute distress. MENTAL STATUS:  Alert and oriented to person, place and time. HEAD:  Short styled brown hair.  Normocephalic, atraumatic, face symmetric, no Cushingoid features. EYES:  Glasses.  Blue eyes.  Pupils equal round and reactive to light and accomodation.  No conjunctivitis or scleral icterus. ENT:  Oropharynx clear without lesion.  Tongue normal. Mucous membranes moist.  RESPIRATORY:  Clear to auscultation without rales, wheezes or rhonchi. CARDIOVASCULAR:  Regular rate and rhythm without murmur, rub or gallop. BREAST:  Right breast with tenderness in the inferior quadrant.  No discrete  masses, skin changes or nipple discharge.  Left breast without masses, skin changes or nipple discharge.  ABDOMEN:  Soft, non-tender, with active bowel sounds, and no hepatosplenomegaly.  No masses. SKIN:  No rashes, ulcers or lesions. EXTREMITIES: No edema, no skin discoloration or tenderness.  No palpable cords. LYMPH NODES: No palpable cervical, supraclavicular, axillary or inguinal adenopathy  NEUROLOGICAL: Unremarkable. PSYCH:  Appropriate.  Appointment on 08/30/2014  Component Date Value Ref Range Status  . WBC 08/30/2014 8.3  3.6 - 11.0 K/uL Final  . RBC 08/30/2014 4.93  3.80 - 5.20 MIL/uL Final  . Hemoglobin 08/30/2014 12.9  12.0 - 16.0 g/dL Final  . HCT 08/30/2014 39.1  35.0 - 47.0 % Final  . MCV 08/30/2014 79.3* 80.0 - 100.0 fL Final  . MCH 08/30/2014 26.1  26.0 - 34.0 pg Final  . MCHC 08/30/2014 32.9  32.0 - 36.0 g/dL Final  . RDW 08/30/2014 16.3* 11.5 - 14.5 % Final  . Platelets 08/30/2014 202  150 - 440 K/uL Final  . Neutrophils Relative % 08/30/2014 60   Final  . Neutro Abs 08/30/2014 5.0  1.4 - 6.5 K/uL Final  . Lymphocytes Relative 08/30/2014 30   Final  . Lymphs Abs 08/30/2014 2.5  1.0 - 3.6 K/uL Final  . Monocytes Relative 08/30/2014 6   Final  . Monocytes Absolute 08/30/2014 0.5  0.2 - 0.9 K/uL Final  . Eosinophils Relative 08/30/2014 3   Final  . Eosinophils Absolute  08/30/2014 0.3  0 - 0.7 K/uL Final  . Basophils Relative 08/30/2014 1   Final  . Basophils Absolute 08/30/2014 0.0  0 - 0.1 K/uL Final  . Sodium 08/30/2014 135  135 - 145 mmol/L Final  . Potassium 08/30/2014 3.8  3.5 - 5.1 mmol/L Final  . Chloride 08/30/2014 103  101 - 111 mmol/L Final  . CO2 08/30/2014 24  22 - 32 mmol/L Final  . Glucose, Bld 08/30/2014 95  65 - 99 mg/dL Final  . BUN 08/30/2014 21* 6 - 20 mg/dL Final  . Creatinine, Ser 08/30/2014 0.91  0.44 - 1.00 mg/dL Final  . Calcium 08/30/2014 8.6* 8.9 - 10.3 mg/dL Final  . Total Protein 08/30/2014 7.3  6.5 - 8.1 g/dL Final  . Albumin 08/30/2014 4.3  3.5 - 5.0 g/dL Final  . AST 08/30/2014 29  15 - 41 U/L Final  . ALT 08/30/2014 36  14 - 54 U/L Final  . Alkaline Phosphatase 08/30/2014 59  38 - 126 U/L Final  . Total Bilirubin 08/30/2014 0.8  0.3 - 1.2 mg/dL Final  . GFR calc non Af Amer 08/30/2014 >60  >60 mL/min Final  . GFR calc Af Amer 08/30/2014 >60  >60 mL/min Final   Comment: (NOTE) The eGFR has been calculated using the CKD EPI equation. This calculation has not been validated in all clinical situations. eGFR's persistently <60 mL/min signify possible Chronic Kidney Disease.   Georgiann Hahn gap 08/30/2014 8  5 - 15 Final    Assessment:  Brittney Ray is a 64 y.o. female with a history of right breast DCIS.  She underwent right partial mastectomy for 2 separate foci of DCIS on 03/14/2012.  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  She completed radiation in 11/2012. She was on tamoxifen for one year , then switched to Femara secondary to severe hot flashes.  She stopped taking Femara in 05/2013.  Bilateral mammogram on 04/18/2014 revealed stable benign postoperative changes.  Her  last bone density study was about 2 years ago.   She has a family history of breast cancer (sister age 56 and 1st cousin).  She has not had genetic testing secondary to cost issues.  Symptomatically, she  notes a little soreness right side around her incision. She has some hip pain (right > left).  She has Paget's disease.  Plan: 1.  Review entire medical history, diagnosis and management of breast cancer, and planned follow-up.  Discus intolerance to tamoxifen and aromatase inhibitors. 2.  Discuss bone density study every 2 years.  Discuss activity, calcium and vitamin D. 3.  Labs today:  CBC with diff, CMP. 4.  Schedule bone density study. 5.  Next mammogram due 04/18/2015. 6.  RTC in 6 months for MD assessment, labs (CBC with diff, CMP), and review of bone density study.    Lequita Asal, MD 08/30/2014, 2:36 PM

## 2014-08-30 NOTE — Progress Notes (Signed)
Patient here today for follow up regarding Breast Cancer. States she has been having increased right hip pain and has a history of pagets bone disease in both hips.

## 2014-09-10 ENCOUNTER — Ambulatory Visit: Payer: No Typology Code available for payment source | Attending: Hematology and Oncology

## 2014-09-21 ENCOUNTER — Telehealth: Payer: Self-pay | Admitting: *Deleted

## 2014-09-21 NOTE — Telephone Encounter (Signed)
  Oncology Nurse Navigator Documentation  Referral date to RadOnc/MedOnc: 09/21/14 (09/21/14 1000) Navigator Encounter Type: Telephone (09/21/14 1000) Patient Visit Type: Follow-up (09/21/14 1000)       Interventions: Coordination of Care (09/21/14 1000)    Patient called and stated she did not have her appointments for her bone density exam or her follow-up with Dr. Alvia Grove.  Appointment was scheduled to see Dr.Corocoran on 02/28/15 at 2:45.  Called Christina to shedule her bone density appointment.  She will notify the patient of that date.        Time Spent with Patient: 15 (09/21/14 1000)

## 2014-10-01 ENCOUNTER — Ambulatory Visit
Admission: RE | Admit: 2014-10-01 | Discharge: 2014-10-01 | Disposition: A | Discharge status: Home / Self Care | Payer: Medicare Other | Source: Ambulatory Visit | Attending: Hematology and Oncology | Admitting: Hematology and Oncology

## 2014-10-01 DIAGNOSIS — D0511 Intraductal carcinoma in situ of right breast: Secondary | ICD-10-CM | POA: Insufficient documentation

## 2014-10-01 DIAGNOSIS — M8580 Other specified disorders of bone density and structure, unspecified site: Secondary | ICD-10-CM | POA: Diagnosis not present

## 2014-10-04 ENCOUNTER — Telehealth: Payer: Self-pay

## 2014-10-04 NOTE — Telephone Encounter (Signed)
Pt called askingplease let her know about her bone density test.  It was 10/01/2014.  She said no rush today, tomorrow..  But she don't want to wait till February appt.  I informed MD of results wanted.

## 2014-10-05 ENCOUNTER — Telehealth: Payer: Self-pay

## 2014-10-05 NOTE — Telephone Encounter (Signed)
Called pt about results per MD request and let her know her T score -1.1. This is in the osteopenia range (not osteoporosis), but almost normal. Normal starts at -1.0.Marland Kitchen  Pt verbalized an understanding and asked that I leave a copy for her to pick up.  I told her it would be printed today and left at the front desk.

## 2014-10-08 DIAGNOSIS — E890 Postprocedural hypothyroidism: Secondary | ICD-10-CM | POA: Insufficient documentation

## 2014-10-08 DIAGNOSIS — E119 Type 2 diabetes mellitus without complications: Secondary | ICD-10-CM | POA: Insufficient documentation

## 2014-10-24 DIAGNOSIS — Z7189 Other specified counseling: Secondary | ICD-10-CM | POA: Insufficient documentation

## 2014-10-24 DIAGNOSIS — Z7185 Encounter for immunization safety counseling: Secondary | ICD-10-CM | POA: Insufficient documentation

## 2015-01-31 ENCOUNTER — Encounter: Payer: Self-pay | Admitting: Radiation Oncology

## 2015-01-31 ENCOUNTER — Ambulatory Visit
Admission: RE | Admit: 2015-01-31 | Discharge: 2015-01-31 | Disposition: A | Discharge status: Home / Self Care | Payer: Medicare Other | Source: Ambulatory Visit | Attending: Radiation Oncology | Admitting: Radiation Oncology

## 2015-01-31 VITALS — BP 142/82 | HR 65 | Temp 96.8°F | Resp 18 | Wt 203.2 lb

## 2015-01-31 DIAGNOSIS — D0511 Intraductal carcinoma in situ of right breast: Secondary | ICD-10-CM

## 2015-01-31 NOTE — Progress Notes (Signed)
Radiation Oncology Follow up Note  Name: Brittney Ray   Date:   01/31/2015 MRN:  [id number] DOB: 04-May-1950    This 65 y.o. 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  REFERRING PROVIDER: Dion Body, MD  HPI: Patient is a 65 year old female now out 2 and half years having completed whole breast radiation to her right breast for 2 foci of ductal carcinoma in situ ER/PR positive. She is seen today in routine follow-up and is doing well. She specifically denies breast tenderness cough or bone pain.. Patient is a cardiac history of MI she is not taking aromatase inhibitor tamoxifen based on side effect profile which she has declined. She specifically denies breast tenderness cough or bone pain. Patient cannot recall her last mammogram I am checking on that will order follow-up mammograms as necessary.  COMPLICATIONS OF TREATMENT: none  FOLLOW UP COMPLIANCE: keeps appointments   PHYSICAL EXAM:  BP 142/82 mmHg  Pulse 65  Temp(Src) 96.8 F (36 C)  Resp 18  Wt 203 lb 2.5 oz (92.15 kg) Lungs are clear to A&P cardiac examination essentially unremarkable with regular rate and rhythm. No dominant mass or nodularity is noted in either breast in 2 positions examined. Incision is well-healed. No axillary or supraclavicular adenopathy is appreciated. Cosmetic result is excellent. Well-developed well-nourished patient in NAD. HEENT reveals PERLA, EOMI, discs not visualized.  Oral cavity is clear. No oral mucosal lesions are identified. Neck is clear without evidence of cervical or supraclavicular adenopathy. Lungs are clear to A&P. Cardiac examination is essentially unremarkable with regular rate and rhythm without murmur rub or thrill. Abdomen is benign with no organomegaly or masses noted. Motor sensory and DTR levels are equal and symmetric in the upper and  lower extremities. Cranial nerves II through XII are grossly intact. Proprioception is intact. No peripheral adenopathy or edema is identified. No motor or sensory levels are noted. Crude visual fields are within normal range.  RADIOLOGY RESULTS: Patient had bones density study showing low bone mass by who criteria. I'm trying track down her last mammograms.  PLAN: At this time patient is doing well with no evidence of disease. I'm trying to track her previous mammograms which I will review when it becomes available. Otherwise I've asked to see her back in 1 year for follow-up. Patient knows to call sooner with any concerns. Patient has declined tamoxifen therapy.  I would like to take this opportunity for allowing me to participate in the care of your patient.Armstead Peaks., MD

## 2015-02-26 ENCOUNTER — Other Ambulatory Visit: Payer: Self-pay

## 2015-02-26 DIAGNOSIS — D0511 Intraductal carcinoma in situ of right breast: Secondary | ICD-10-CM

## 2015-02-28 ENCOUNTER — Inpatient Hospital Stay (HOSPITAL_BASED_OUTPATIENT_CLINIC_OR_DEPARTMENT_OTHER): Payer: Medicare Other | Admitting: Hematology and Oncology

## 2015-02-28 ENCOUNTER — Inpatient Hospital Stay: Payer: Medicare Other | Attending: Hematology and Oncology

## 2015-02-28 VITALS — BP 155/85 | HR 89 | Temp 98.2°F | Resp 18 | Wt 202.3 lb

## 2015-02-28 DIAGNOSIS — Z79810 Long term (current) use of selective estrogen receptor modulators (SERMs): Secondary | ICD-10-CM | POA: Insufficient documentation

## 2015-02-28 DIAGNOSIS — M25551 Pain in right hip: Secondary | ICD-10-CM

## 2015-02-28 DIAGNOSIS — Z7982 Long term (current) use of aspirin: Secondary | ICD-10-CM | POA: Diagnosis not present

## 2015-02-28 DIAGNOSIS — E039 Hypothyroidism, unspecified: Secondary | ICD-10-CM | POA: Insufficient documentation

## 2015-02-28 DIAGNOSIS — M8580 Other specified disorders of bone density and structure, unspecified site: Secondary | ICD-10-CM

## 2015-02-28 DIAGNOSIS — Z803 Family history of malignant neoplasm of breast: Secondary | ICD-10-CM | POA: Diagnosis not present

## 2015-02-28 DIAGNOSIS — Z7984 Long term (current) use of oral hypoglycemic drugs: Secondary | ICD-10-CM | POA: Diagnosis not present

## 2015-02-28 DIAGNOSIS — Z170 Estrogen receptor positive status [ER+]: Secondary | ICD-10-CM

## 2015-02-28 DIAGNOSIS — D0511 Intraductal carcinoma in situ of right breast: Secondary | ICD-10-CM | POA: Diagnosis present

## 2015-02-28 DIAGNOSIS — I10 Essential (primary) hypertension: Secondary | ICD-10-CM | POA: Insufficient documentation

## 2015-02-28 DIAGNOSIS — Z923 Personal history of irradiation: Secondary | ICD-10-CM | POA: Insufficient documentation

## 2015-02-28 DIAGNOSIS — K219 Gastro-esophageal reflux disease without esophagitis: Secondary | ICD-10-CM | POA: Diagnosis not present

## 2015-02-28 DIAGNOSIS — E119 Type 2 diabetes mellitus without complications: Secondary | ICD-10-CM | POA: Diagnosis not present

## 2015-02-28 DIAGNOSIS — M1990 Unspecified osteoarthritis, unspecified site: Secondary | ICD-10-CM | POA: Insufficient documentation

## 2015-02-28 LAB — COMPREHENSIVE METABOLIC PANEL
ALT: 41 U/L (ref 14–54)
AST: 26 U/L (ref 15–41)
Albumin: 4.2 g/dL (ref 3.5–5.0)
Alkaline Phosphatase: 63 U/L (ref 38–126)
Anion gap: 9 (ref 5–15)
BUN: 16 mg/dL (ref 6–20)
CO2: 23 mmol/L (ref 22–32)
Calcium: 8.8 mg/dL — ABNORMAL LOW (ref 8.9–10.3)
Chloride: 104 mmol/L (ref 101–111)
Creatinine, Ser: 1.11 mg/dL — ABNORMAL HIGH (ref 0.44–1.00)
GFR calc Af Amer: 59 mL/min — ABNORMAL LOW (ref >60)
GFR calc non Af Amer: 51 mL/min — ABNORMAL LOW (ref >60)
Glucose, Bld: 126 mg/dL — ABNORMAL HIGH (ref 65–99)
Potassium: 3.8 mmol/L (ref 3.5–5.1)
Sodium: 136 mmol/L (ref 135–145)
Total Bilirubin: 0.7 mg/dL (ref 0.3–1.2)
Total Protein: 7.3 g/dL (ref 6.5–8.1)

## 2015-02-28 LAB — CBC WITH DIFFERENTIAL/PLATELET
Basophils Absolute: 0.1 10*3/uL (ref 0–0.1)
Basophils Relative: 1 %
Eosinophils Absolute: 0.3 10*3/uL (ref 0–0.7)
Eosinophils Relative: 4 %
HCT: 39.5 % (ref 35.0–47.0)
Hemoglobin: 13.3 g/dL (ref 12.0–16.0)
Lymphocytes Relative: 32 %
Lymphs Abs: 2.3 10*3/uL (ref 1.0–3.6)
MCH: 26.9 pg (ref 26.0–34.0)
MCHC: 33.7 g/dL (ref 32.0–36.0)
MCV: 79.9 fL — ABNORMAL LOW (ref 80.0–100.0)
Monocytes Absolute: 0.5 10*3/uL (ref 0.2–0.9)
Monocytes Relative: 7 %
Neutro Abs: 4.1 10*3/uL (ref 1.4–6.5)
Neutrophils Relative %: 56 %
Platelets: 228 10*3/uL (ref 150–440)
RBC: 4.94 MIL/uL (ref 3.80–5.20)
RDW: 15.4 % — ABNORMAL HIGH (ref 11.5–14.5)
WBC: 7.3 10*3/uL (ref 3.6–11.0)

## 2015-02-28 NOTE — Progress Notes (Signed)
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  HPI:  The patient was last seen in the medical oncology clinic on 08/30/2014.  At that time, she was seen for initial assessment by me.  She noted a little soreness on the right side around her incision. She had some hip pain (right > left).  She has known Paget's disease.  CBC was notable for a normal hematocrit and an MCV of 79.3.  Calcium was 8.6 (8.9-10.3).  We discussed her intolerance to tamoxifen and aromatase inhibitors.  A bone density study was scheduled.  Bone density study on 10/01/2014 revealed mild osteopenia with a T score of -1.1 in the right femur.  Symptomatically, she states that she feels good.  She has no breast concerns.  Since last visit, she received an injection (? steroid)  in her right hip.  Her hip now feels fine.  She still has some arthritis.  Last  week she was sick,, throwing up.  Her GI illness has resolved.  She comments that can wake up in the middle of the night. She feels that that is due to her cholesterol pill.  She had a thyroid ultrasound on 02/25/2015.  She sees Dr. Gabriel Carina, endocrinologist for follow-up.  Past Medical History  Diagnosis Date  . PONV (postoperative nausea and vomiting)   . H/O hiatal hernia   . GERD (gastroesophageal reflux disease)   . Arthritis     knees  . Hypothyroidism   . Nocturia     3-4 x/night  . Anxiety     panic attacks; no curren med.  Marland Kitchen Heart murmur     states never had any problems  . Hypertension     under control, has been on med. > 10 yrs.  . Paget's bone disease     hips  . Medial meniscus tear 04/2011    right knee  . Diabetes mellitus     NIDDM    Past Surgical History  Procedure Laterality Date  . Knee surgery  07/31/2009    right  . Abdominal hysterectomy  1978    partial  . Cholecystectomy  11/2005  . Back surgery   04/1990, 1996  . Foot surgery  1992    left  . Thyroid lobectomy  03/1992    left  . Hand surgery  10/1999    nerve repair right thumb  . Knee surgery  2009    left  . [place] procedure  09/1989    with rectal repair    Family History:  Sister had breast cancer at age 79.  A first cousin had breast cancer.  An aunt had rectal cancer.  Social History:  reports that she has never smoked. She has never used smokeless tobacco. She reports that she does not drink alcohol or use illicit drugs.  The patient is alone today.  Allergies:  Allergies  Allergen Reactions  . Contrast Media [Iodinated Diagnostic Agents] Hives and Cough  . Morphine And Related Other (See Comments)    JITTERY  . Codeine Other (See Comments) and Anxiety    Panic Attacks PANIC ATTACKS  . Sulfa Antibiotics Other (See Comments)    STOMACH IRRITATION    Current Medications: Current Outpatient Prescriptions  Medication Sig Dispense Refill  . albuterol (PROAIR HFA) 108 (90 BASE) MCG/ACT inhaler Inhale 2 Inhalers into the lungs as needed.    Marland Kitchen  aspirin 325 MG tablet Take 1 tablet by mouth daily.    . calcium carbonate (OS-CAL) 600 MG TABS Take 600 mg by mouth 2 (two) times daily with a meal.    . cholecalciferol (VITAMIN D) 1000 UNITS tablet Take 1,000 Units by mouth daily.    . clopidogrel (PLAVIX) 75 MG tablet Take 1 tablet by mouth daily.    . Cyanocobalamin (RA VITAMIN B-12 TR) 1000 MCG TBCR Take 1 tablet by mouth daily.    . ferrous sulfate 325 (65 FE) MG tablet Take 1 tablet by mouth daily.    . fish oil-omega-3 fatty acids 1000 MG capsule Take 2 g by mouth daily.    Marland Kitchen glimepiride (AMARYL) 2 MG tablet Take 0.5 tablets by mouth daily.    Marland Kitchen levothyroxine (SYNTHROID, LEVOTHROID) 50 MCG tablet Take 50 mcg by mouth daily. AM    . lisinopril-hydrochlorothiazide (PRINZIDE,ZESTORETIC) 10-12.5 MG per tablet Take 1 tablet by mouth daily. AM    . metFORMIN (GLUCOPHAGE) 500 MG tablet Take 1,000 mg by mouth 2 (two) times  daily.     . metoprolol tartrate (LOPRESSOR) 25 MG tablet Take 0.5 tablets by mouth 2 (two) times daily.    . nitroGLYCERIN (NITROSTAT) 0.4 MG SL tablet Place 1 tablet under the tongue as needed.    . pantoprazole (PROTONIX) 40 MG tablet Take 40 mg by mouth daily before lunch. NOON    . pravastatin (PRAVACHOL) 20 MG tablet Take 1 tablet by mouth daily.     No current facility-administered medications for this visit.    Review of Systems:  GENERAL:  Feels good.  No fevers, sweats or weight loss. PERFORMANCE STATUS (ECOG):  0 HEENT:  No visual changes, runny nose, sore throat, mouth sores or tenderness. Lungs: No shortness of breath or cough.  No hemoptysis. Cardiac:  No chest pain, palpitations, orthopnea, or PND. Breast:  No breast concerns.  No masses. GI:  No nausea, vomiting, diarrhea, constipation, melena or hematochezia. GU:  No urgency, frequency, dysuria, or hematuria. Musculoskeletal:  Paget's disease.  Right hip pain resolved after injection.  No back pain.  No muscle tenderness. Extremities:  No pain or swelling. Skin:  No rashes or skin changes. Neuro:  No headache, numbness or weakness, balance or coordination issues. Endocrine: Diabetes.  Thyroid disease on Synthroid.  No hot flashes or night sweats. Psych:  No mood changes, depression or anxiety. Pain:  No focal pain. Review of systems:  All other systems reviewed and found to be negative.  Physical Exam: Blood pressure 155/85, pulse 89, temperature 98.2 F (36.8 C), temperature source Tympanic, resp. rate 18, weight 202 lb 4.4 oz (91.75 kg). GENERAL:  Well developed, well nourished, sitting comfortably in the exam room in no acute distress. MENTAL STATUS:  Alert and oriented to person, place and time. HEAD:  Short styled brown hair.  Normocephalic, atraumatic, face symmetric, no Cushingoid features. EYES:  Glasses.  Blue eyes.  Pupils equal round and reactive to light and accomodation.  No conjunctivitis or scleral  icterus. ENT:  Oropharynx clear without lesion.  Tongue normal. Mucous membranes moist.  RESPIRATORY:  Clear to auscultation without rales, wheezes or rhonchi. CARDIOVASCULAR:  Regular rate and rhythm without murmur, rub or gallop. BREAST:  Right breast with tenderness in the inferior quadrant (stable).  No discrete masses, skin changes or nipple discharge.  Left breast without masses, skin changes or nipple discharge.  ABDOMEN:  Soft, non-tender, with active bowel sounds, and no appreciable hepatosplenomegaly.  No masses. SKIN:  No rashes, ulcers or lesions. EXTREMITIES: No edema, no skin discoloration or tenderness.  No palpable cords. LYMPH NODES: No palpable cervical, supraclavicular, axillary or inguinal adenopathy  NEUROLOGICAL: Unremarkable. PSYCH:  Appropriate.  Appointment on 02/28/2015  Component Date Value Ref Range Status  . WBC 02/28/2015 7.3  3.6 - 11.0 K/uL Final  . RBC 02/28/2015 4.94  3.80 - 5.20 MIL/uL Final  . Hemoglobin 02/28/2015 13.3  12.0 - 16.0 g/dL Final  . HCT 02/28/2015 39.5  35.0 - 47.0 % Final  . MCV 02/28/2015 79.9* 80.0 - 100.0 fL Final  . MCH 02/28/2015 26.9  26.0 - 34.0 pg Final  . MCHC 02/28/2015 33.7  32.0 - 36.0 g/dL Final  . RDW 02/28/2015 15.4* 11.5 - 14.5 % Final  . Platelets 02/28/2015 228  150 - 440 K/uL Final  . Neutrophils Relative % 02/28/2015 56   Final  . Neutro Abs 02/28/2015 4.1  1.4 - 6.5 K/uL Final  . Lymphocytes Relative 02/28/2015 32   Final  . Lymphs Abs 02/28/2015 2.3  1.0 - 3.6 K/uL Final  . Monocytes Relative 02/28/2015 7   Final  . Monocytes Absolute 02/28/2015 0.5  0.2 - 0.9 K/uL Final  . Eosinophils Relative 02/28/2015 4   Final  . Eosinophils Absolute 02/28/2015 0.3  0 - 0.7 K/uL Final  . Basophils Relative 02/28/2015 1   Final  . Basophils Absolute 02/28/2015 0.1  0 - 0.1 K/uL Final  . Sodium 02/28/2015 136  135 - 145 mmol/L Final  . Potassium 02/28/2015 3.8  3.5 - 5.1 mmol/L Final  . Chloride 02/28/2015 104  101 - 111  mmol/L Final  . CO2 02/28/2015 23  22 - 32 mmol/L Final  . Glucose, Bld 02/28/2015 126* 65 - 99 mg/dL Final  . BUN 02/28/2015 16  6 - 20 mg/dL Final  . Creatinine, Ser 02/28/2015 1.11* 0.44 - 1.00 mg/dL Final  . Calcium 02/28/2015 8.8* 8.9 - 10.3 mg/dL Final  . Total Protein 02/28/2015 7.3  6.5 - 8.1 g/dL Final  . Albumin 02/28/2015 4.2  3.5 - 5.0 g/dL Final  . AST 02/28/2015 26  15 - 41 U/L Final  . ALT 02/28/2015 41  14 - 54 U/L Final  . Alkaline Phosphatase 02/28/2015 63  38 - 126 U/L Final  . Total Bilirubin 02/28/2015 0.7  0.3 - 1.2 mg/dL Final  . GFR calc non Af Amer 02/28/2015 51* >60 mL/min Final  . GFR calc Af Amer 02/28/2015 59* >60 mL/min Final   Comment: (NOTE) The eGFR has been calculated using the CKD EPI equation. This calculation has not been validated in all clinical situations. eGFR's persistently <60 mL/min signify possible Chronic Kidney Disease.   Georgiann Hahn gap 02/28/2015 9  5 - 15 Final    Assessment:  LINDEN MIKES is a 65 y.o. female with a history of right breast DCIS.  She underwent right partial mastectomy for 2 separate foci of DCIS on 03/14/2012.  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  She completed radiation in 11/2012. She was on tamoxifen for one year , then switched to Femara secondary to severe hot flashes.  She stopped taking Femara in 05/2013.  She does not want to take any further hormonal therapy.  Bilateral mammogram on 04/18/2014 revealed stable benign postoperative changes.  Bone density study on 10/01/2014 revealed mild osteopenia with a T score of -1.1 in the right femur.   She has a family history of  breast cancer (sister age 13 and 1st cousin).  She has not had genetic testing secondary to cost issues.  Symptomatically, she feels good.  Exam is stable.  Plan: 1.  Labs today:  CBC with diff, CMP. 2.  Review bone density study.  Discuss activity, calcium and vitamin D. 3.  Schedule mammogram  on 04/18/2015. 4.  RTC in 6 months for MD assessment, labs (CBC with diff, CMP), and review of mammogram.    Lequita Asal, MD 02/28/2015, 2:07 PM

## 2015-03-02 ENCOUNTER — Encounter: Payer: Self-pay | Admitting: Hematology and Oncology

## 2015-03-04 DIAGNOSIS — E042 Nontoxic multinodular goiter: Secondary | ICD-10-CM | POA: Insufficient documentation

## 2015-03-25 DIAGNOSIS — E669 Obesity, unspecified: Secondary | ICD-10-CM | POA: Insufficient documentation

## 2015-05-07 ENCOUNTER — Other Ambulatory Visit: Payer: Self-pay | Admitting: Hematology and Oncology

## 2015-05-07 ENCOUNTER — Ambulatory Visit
Admission: RE | Admit: 2015-05-07 | Discharge: 2015-05-07 | Disposition: A | Discharge status: Home / Self Care | Payer: Medicare Other | Source: Ambulatory Visit | Attending: Hematology and Oncology | Admitting: Hematology and Oncology

## 2015-05-07 DIAGNOSIS — D0511 Intraductal carcinoma in situ of right breast: Secondary | ICD-10-CM

## 2015-05-07 DIAGNOSIS — Z853 Personal history of malignant neoplasm of breast: Secondary | ICD-10-CM | POA: Insufficient documentation

## 2015-05-07 DIAGNOSIS — R921 Mammographic calcification found on diagnostic imaging of breast: Secondary | ICD-10-CM | POA: Insufficient documentation

## 2015-08-29 ENCOUNTER — Encounter: Payer: Self-pay | Admitting: Hematology and Oncology

## 2015-08-29 ENCOUNTER — Inpatient Hospital Stay (HOSPITAL_BASED_OUTPATIENT_CLINIC_OR_DEPARTMENT_OTHER): Payer: Medicare Other | Admitting: Hematology and Oncology

## 2015-08-29 ENCOUNTER — Inpatient Hospital Stay: Payer: Medicare Other | Attending: Hematology and Oncology

## 2015-08-29 ENCOUNTER — Encounter (INDEPENDENT_AMBULATORY_CARE_PROVIDER_SITE_OTHER): Payer: Self-pay

## 2015-08-29 VITALS — BP 131/79 | HR 86 | Temp 97.6°F | Wt 201.9 lb

## 2015-08-29 DIAGNOSIS — Z923 Personal history of irradiation: Secondary | ICD-10-CM

## 2015-08-29 DIAGNOSIS — D0591 Unspecified type of carcinoma in situ of right breast: Secondary | ICD-10-CM

## 2015-08-29 DIAGNOSIS — Z170 Estrogen receptor positive status [ER+]: Secondary | ICD-10-CM | POA: Insufficient documentation

## 2015-08-29 DIAGNOSIS — M8580 Other specified disorders of bone density and structure, unspecified site: Secondary | ICD-10-CM | POA: Diagnosis not present

## 2015-08-29 DIAGNOSIS — Z9011 Acquired absence of right breast and nipple: Secondary | ICD-10-CM | POA: Insufficient documentation

## 2015-08-29 DIAGNOSIS — Z803 Family history of malignant neoplasm of breast: Secondary | ICD-10-CM | POA: Insufficient documentation

## 2015-08-29 DIAGNOSIS — Z79899 Other long term (current) drug therapy: Secondary | ICD-10-CM | POA: Diagnosis not present

## 2015-08-29 DIAGNOSIS — D0511 Intraductal carcinoma in situ of right breast: Secondary | ICD-10-CM | POA: Insufficient documentation

## 2015-08-29 DIAGNOSIS — R921 Mammographic calcification found on diagnostic imaging of breast: Secondary | ICD-10-CM

## 2015-08-29 NOTE — Progress Notes (Signed)
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  HPI:  The patient was last seen in the medical oncology clinic on 02/28/2015.  At that time, she felt good.  Exam was stable.  CBC and CMP were normal except for a creatinine of 1.11 and a calcium of 8.8.  Bilateral mammogram on 05/07/2015 revealed a few developing coarse calcifications within the scar in the right breast. These may be related to evolving surgical changes.  There was no mammographic evidence of malignancy in the bilateral breasts.  Recommendation was for 6 month follow-up diagnostic right breast mammogram.  During the interim, she has felt "okay".  She states that her cholesterol is up a little.  She denies any breast concerns.  Mammogram is scheduled for 11/06/2015.   Past Medical History:  Diagnosis Date  . Anxiety    panic attacks; no curren med.  . Arthritis    knees  . Diabetes mellitus    NIDDM  . GERD (gastroesophageal reflux disease)   . H/O hiatal hernia   . Heart murmur    states never had any problems  . Hypertension    under control, has been on med. > 10 yrs.  . Hypothyroidism   . Medial meniscus tear 04/2011   right knee  . Nocturia    3-4 x/night  . Paget's bone disease    hips  . PONV (postoperative nausea and vomiting)     Past Surgical History:  Procedure Laterality Date  . ABDOMINAL HYSTERECTOMY  1978   partial  . BACK SURGERY  04/1990, 1996  . BREAST EXCISIONAL BIOPSY Right 2014   lumpectomy with radiation  . [place] PROCEDURE  09/1989   with rectal repair  . CHOLECYSTECTOMY  11/2005  . FOOT SURGERY  1992   left  . HAND SURGERY  10/1999   nerve repair right thumb  . KNEE SURGERY  07/31/2009   right  . KNEE SURGERY  2009   left  . THYROID LOBECTOMY  03/1992   left    Family History:  Sister had breast cancer at age 20.  A first cousin had breast  cancer.  An aunt had rectal cancer.  Social History:  reports that she has never smoked. She has never used smokeless tobacco. She reports that she does not drink alcohol or use drugs.  She livs in [place].  The patient is alone today.  Allergies:  Allergies  Allergen Reactions  . Contrast Media [Iodinated Diagnostic Agents] Hives and Cough  . Morphine And Related Other (See Comments)    JITTERY  . Codeine Other (See Comments) and Anxiety    Panic Attacks PANIC ATTACKS  . Sulfa Antibiotics Other (See Comments)    STOMACH IRRITATION    Current Medications: Current Outpatient Prescriptions  Medication Sig Dispense Refill  . albuterol (PROAIR HFA) 108 (90 BASE) MCG/ACT inhaler Inhale 2 Inhalers into the lungs as needed.    Marland Kitchen aspirin 325 MG tablet Take 1 tablet by mouth daily.    . calcium carbonate (OS-CAL) 600 MG TABS Take 600 mg by mouth 2 (two) times daily with a meal.    . cholecalciferol (VITAMIN D) 1000 UNITS tablet Take 1,000 Units by mouth daily.    . clopidogrel (PLAVIX) 75 MG tablet TAKE 1 TABLET ONE TIME DAILY    . Cyanocobalamin (RA VITAMIN B-12 TR) 1000 MCG  TBCR Take by mouth.    . ferrous sulfate 325 (65 FE) MG tablet Take by mouth.    . fish oil-omega-3 fatty acids 1000 MG capsule Take 2 g by mouth daily.    Marland Kitchen glimepiride (AMARYL) 2 MG tablet TAKE 1 TABLET TWICE DAILY    . Lancet Devices (RELION LANCING DEVICE) MISC Use as directed.     Marland Kitchen levothyroxine (SYNTHROID, LEVOTHROID) 50 MCG tablet Take by mouth.    Marland Kitchen lisinopril-hydrochlorothiazide (PRINZIDE,ZESTORETIC) 10-12.5 MG tablet Take by mouth.    . metFORMIN (GLUCOPHAGE) 1000 MG tablet Take by mouth.    . metFORMIN (GLUCOPHAGE) 500 MG tablet Take 1,000 mg by mouth 2 (two) times daily.     . metoprolol tartrate (LOPRESSOR) 25 MG tablet TAKE 1/2 TABLET  TWICE A DAY    . nitroGLYCERIN (NITROSTAT) 0.4 MG SL tablet Place under the tongue.    . pantoprazole (PROTONIX) 40 MG tablet TAKE 1 TABLET ONE TIME DAILY    .  pravastatin (PRAVACHOL) 20 MG tablet TAKE 1 TABLET EVERY NIGHT     No current facility-administered medications for this visit.     Review of Systems:  GENERAL:  Feels good.  No fevers or sweats.  Weight down 1 pound. PERFORMANCE STATUS (ECOG):  0 HEENT:  No visual changes, runny nose, sore throat, mouth sores or tenderness. Lungs: No shortness of breath or cough.  No hemoptysis. Cardiac:  No chest pain, palpitations, orthopnea, or PND. Breast:  No breast concerns.  No masses. GI:  No nausea, vomiting, diarrhea, constipation, melena or hematochezia. GU:  No urgency, frequency, dysuria, or hematuria. Musculoskeletal:  Paget's disease.  No back pain.  No muscle tenderness. Extremities:  No pain or swelling. Skin:  No rashes or skin changes. Neuro:  No headache, numbness or weakness, balance or coordination issues. Endocrine: Diabetes.  Thyroid disease on Synthroid.  No hot flashes or night sweats. Psych:  No mood changes, depression or anxiety. Pain:  No focal pain. Review of systems:  All other systems reviewed and found to be negative.  Physical Exam: Blood pressure 131/79, pulse 86, temperature 97.6 F (36.4 C), temperature source Tympanic, weight 201 lb 15.1 oz (91.6 kg). GENERAL:  Well developed, well nourished, woman sitting comfortably in the exam room in no acute distress. MENTAL STATUS:  Alert and oriented to person, place and time. HEAD:  Brown curly hair.  Normocephalic, atraumatic, face symmetric, no Cushingoid features. EYES:  Glasses.  Blue eyes.  Pupils equal round and reactive to light and accomodation.  No conjunctivitis or scleral icterus. ENT:  Oropharynx clear without lesion.  Tongue normal. Mucous membranes moist.  RESPIRATORY:  Clear to auscultation without rales, wheezes or rhonchi. CARDIOVASCULAR:  Regular rate and rhythm without murmur, rub or gallop. BREAST:  Right breast tenderness in the inferior quadrant.  No discrete masses, skin changes or nipple  discharge.  Left breast without masses, skin changes or nipple discharge.  ABDOMEN:  Soft, non-tender, with active bowel sounds, and no appreciable hepatosplenomegaly.  No masses. SKIN:  No rashes, ulcers or lesions. EXTREMITIES: No edema, no skin discoloration or tenderness.  No palpable cords. LYMPH NODES: No palpable cervical, supraclavicular, axillary or inguinal adenopathy  NEUROLOGICAL: Unremarkable. PSYCH:  Appropriate.   No visits with results within 3 Day(s) from this visit.  Latest known visit with results is:  Appointment on 02/28/2015  Component Date Value Ref Range Status  . WBC 02/28/2015 7.3  3.6 - 11.0 K/uL Final  . RBC 02/28/2015 4.94  3.80 -  5.20 MIL/uL Final  . Hemoglobin 02/28/2015 13.3  12.0 - 16.0 g/dL Final  . HCT 02/28/2015 39.5  35.0 - 47.0 % Final  . MCV 02/28/2015 79.9* 80.0 - 100.0 fL Final  . MCH 02/28/2015 26.9  26.0 - 34.0 pg Final  . MCHC 02/28/2015 33.7  32.0 - 36.0 g/dL Final  . RDW 02/28/2015 15.4* 11.5 - 14.5 % Final  . Platelets 02/28/2015 228  150 - 440 K/uL Final  . Neutrophils Relative % 02/28/2015 56  % Final  . Neutro Abs 02/28/2015 4.1  1.4 - 6.5 K/uL Final  . Lymphocytes Relative 02/28/2015 32  % Final  . Lymphs Abs 02/28/2015 2.3  1.0 - 3.6 K/uL Final  . Monocytes Relative 02/28/2015 7  % Final  . Monocytes Absolute 02/28/2015 0.5  0.2 - 0.9 K/uL Final  . Eosinophils Relative 02/28/2015 4  % Final  . Eosinophils Absolute 02/28/2015 0.3  0 - 0.7 K/uL Final  . Basophils Relative 02/28/2015 1  % Final  . Basophils Absolute 02/28/2015 0.1  0 - 0.1 K/uL Final  . Sodium 02/28/2015 136  135 - 145 mmol/L Final  . Potassium 02/28/2015 3.8  3.5 - 5.1 mmol/L Final  . Chloride 02/28/2015 104  101 - 111 mmol/L Final  . CO2 02/28/2015 23  22 - 32 mmol/L Final  . Glucose, Bld 02/28/2015 126* 65 - 99 mg/dL Final  . BUN 02/28/2015 16  6 - 20 mg/dL Final  . Creatinine, Ser 02/28/2015 1.11* 0.44 - 1.00 mg/dL Final  . Calcium 02/28/2015 8.8* 8.9 - 10.3  mg/dL Final  . Total Protein 02/28/2015 7.3  6.5 - 8.1 g/dL Final  . Albumin 02/28/2015 4.2  3.5 - 5.0 g/dL Final  . AST 02/28/2015 26  15 - 41 U/L Final  . ALT 02/28/2015 41  14 - 54 U/L Final  . Alkaline Phosphatase 02/28/2015 63  38 - 126 U/L Final  . Total Bilirubin 02/28/2015 0.7  0.3 - 1.2 mg/dL Final  . GFR calc non Af Amer 02/28/2015 51* >60 mL/min Final  . GFR calc Af Amer 02/28/2015 59* >60 mL/min Final   Comment: (NOTE) The eGFR has been calculated using the CKD EPI equation. This calculation has not been validated in all clinical situations. eGFR's persistently <60 mL/min signify possible Chronic Kidney Disease.   Georgiann Hahn gap 02/28/2015 9  5 - 15 Final    Assessment:  TOCARRA GASSEN is a 65 y.o. female with a history of right breast DCIS.  She underwent right partial mastectomy for 2 separate foci of DCIS on 03/14/2012.  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  She completed radiation in 11/2012. She was on tamoxifen for one year , then switched to Femara secondary to severe hot flashes.  She stopped taking Femara in 05/2013.  She does not want to take any further hormonal therapy.  Bilateral mammogram on 05/07/2015 revealed a a few developing coarse calcifications within the scar in the right breast.  Bone density study on 10/01/2014 revealed mild osteopenia with a T score of -1.1 in the right femur.   She has a family history of breast cancer (sister age 66 and 1st cousin).  She has not had genetic testing secondary to cost issues.  Symptomatically, she feels good.  Exam is stable.  Plan: 1.  Labs today:  CBC with diff, CMP. 2.  Review mammogram on 05/07/2015.  Discuss plan for 6 month right breast mammogram  to follow-up on calcifications, 3.  Diagnostic right mammogram on 11/06/2015. 4.  RTC in 6 months for MD assessment, labs (CBC with diff, CMP), and review of mammogram.    Lequita Asal, MD 08/29/2015, 3:28 PM

## 2015-09-01 ENCOUNTER — Other Ambulatory Visit: Payer: Self-pay | Admitting: *Deleted

## 2015-09-01 DIAGNOSIS — D0591 Unspecified type of carcinoma in situ of right breast: Secondary | ICD-10-CM

## 2015-09-25 ENCOUNTER — Other Ambulatory Visit: Payer: Self-pay | Admitting: Unknown Physician Specialty

## 2015-09-25 DIAGNOSIS — R1032 Left lower quadrant pain: Secondary | ICD-10-CM

## 2015-09-26 ENCOUNTER — Ambulatory Visit
Admission: RE | Admit: 2015-09-26 | Discharge: 2015-09-26 | Disposition: A | Discharge status: Home / Self Care | Payer: Medicare Other | Source: Ambulatory Visit | Attending: Unknown Physician Specialty | Admitting: Unknown Physician Specialty

## 2015-09-26 DIAGNOSIS — R1032 Left lower quadrant pain: Secondary | ICD-10-CM | POA: Diagnosis present

## 2015-09-26 DIAGNOSIS — K5790 Diverticulosis of intestine, part unspecified, without perforation or abscess without bleeding: Secondary | ICD-10-CM | POA: Insufficient documentation

## 2015-09-26 DIAGNOSIS — R937 Abnormal findings on diagnostic imaging of other parts of musculoskeletal system: Secondary | ICD-10-CM | POA: Diagnosis not present

## 2015-09-26 DIAGNOSIS — K760 Fatty (change of) liver, not elsewhere classified: Secondary | ICD-10-CM | POA: Diagnosis not present

## 2015-11-07 ENCOUNTER — Other Ambulatory Visit: Payer: Self-pay | Admitting: Hematology and Oncology

## 2015-11-07 ENCOUNTER — Ambulatory Visit
Admission: RE | Admit: 2015-11-07 | Discharge: 2015-11-07 | Disposition: A | Discharge status: Home / Self Care | Payer: Medicare Other | Source: Ambulatory Visit | Attending: Hematology and Oncology | Admitting: Hematology and Oncology

## 2015-11-07 DIAGNOSIS — D0591 Unspecified type of carcinoma in situ of right breast: Secondary | ICD-10-CM

## 2015-11-07 DIAGNOSIS — R921 Mammographic calcification found on diagnostic imaging of breast: Secondary | ICD-10-CM | POA: Insufficient documentation

## 2015-11-07 HISTORY — DX: Malignant neoplasm of unspecified site of unspecified female breast: C50.919

## 2015-11-07 HISTORY — DX: Personal history of irradiation: Z92.3

## 2015-12-27 ENCOUNTER — Ambulatory Visit: Payer: Self-pay | Admitting: Rheumatology

## 2016-01-03 ENCOUNTER — Ambulatory Visit: Payer: Self-pay | Admitting: Rheumatology

## 2016-01-10 ENCOUNTER — Ambulatory Visit (INDEPENDENT_AMBULATORY_CARE_PROVIDER_SITE_OTHER): Payer: Medicare Other | Admitting: Rheumatology

## 2016-01-10 DIAGNOSIS — M25561 Pain in right knee: Secondary | ICD-10-CM | POA: Diagnosis not present

## 2016-01-10 DIAGNOSIS — M170 Bilateral primary osteoarthritis of knee: Secondary | ICD-10-CM

## 2016-01-10 DIAGNOSIS — G8929 Other chronic pain: Secondary | ICD-10-CM

## 2016-01-10 DIAGNOSIS — M25562 Pain in left knee: Secondary | ICD-10-CM

## 2016-01-10 MED ORDER — LIDOCAINE HCL 1 % IJ SOLN
1.5000 mL | INTRAMUSCULAR | Status: AC | PRN
  Administered 2016-01-10: 1.5 mL

## 2016-01-10 MED ORDER — SODIUM HYALURONATE (VISCOSUP) 20 MG/2ML IX SOSY
20.0000 mg | PREFILLED_SYRINGE | INTRA_ARTICULAR | Status: AC | PRN
  Administered 2016-01-10: 20 mg via INTRA_ARTICULAR

## 2016-01-10 NOTE — Progress Notes (Signed)
   Procedure Note  Patient: Brittney Ray             Date of Birth: 1950-04-07           MRN: [id number]             Visit Date: 01/10/2016  Procedures: Visit Diagnoses: Primary osteoarthritis of both knees  Chronic pain of both knees  Large Joint Inj Date/Time: 01/10/2016 2:09 PM Performed by: Eliezer Lofts Authorized by: Eliezer Lofts   Consent Given by:  Patient Site marked: the procedure site was marked   Timeout: prior to procedure the correct patient, procedure, and site was verified   Indications:  Pain and joint swelling Location:  Knee Site:  R knee Prep: patient was prepped and draped in usual sterile fashion   Needle Size:  27 G Needle Length:  1.5 inches Approach:  Medial Ultrasound Guidance: No   Fluoroscopic Guidance: No   Arthrogram: No   Medications:  1.5 mL lidocaine 1 %; 20 mg Sodium Hyaluronate 20 MG/2ML Aspiration Attempted: Yes   Aspirate amount (mL):  0 Patient tolerance:  Patient tolerated the procedure well with no immediate complications  No complication after the injection Patient tolerated procedure well. Large Joint Inj Date/Time: 01/10/2016 2:10 PM Performed by: Eliezer Lofts Authorized by: Eliezer Lofts   Consent Given by:  Patient Site marked: the procedure site was marked   Timeout: prior to procedure the correct patient, procedure, and site was verified   Indications:  Pain and joint swelling Location:  Knee Site:  L knee Prep: patient was prepped and draped in usual sterile fashion   Needle Size:  27 G Needle Length:  1.5 inches Approach:  Medial Ultrasound Guidance: No   Fluoroscopic Guidance: No   Arthrogram: No   Medications:  1.5 mL lidocaine 1 %; 20 mg Sodium Hyaluronate 20 MG/2ML Aspiration Attempted: Yes   Aspirate amount (mL):  0 Patient tolerance:  Patient tolerated the procedure well with no immediate complications  No complication after the injection Patient tolerated procedure well.   After  informed consent was obtained, patient's knees were prepped in nature sterile fashion and injected with one and half mL to 1% lidocaine followed by a prefilled syringe of Hyalgan.  Patient tolerated procedure well. There was no complication  Return to clinic in 1 week for Hyalgan No. 2 bilateral knees.  Patient was reminded to observe the standard precautions after the knee injections including minimal use of knees for the next 24-48 hours and not getting them wet until tomorrow morning.

## 2016-01-17 ENCOUNTER — Ambulatory Visit (INDEPENDENT_AMBULATORY_CARE_PROVIDER_SITE_OTHER): Payer: Medicare Other | Admitting: Rheumatology

## 2016-01-17 DIAGNOSIS — M25561 Pain in right knee: Secondary | ICD-10-CM | POA: Diagnosis not present

## 2016-01-17 DIAGNOSIS — M25562 Pain in left knee: Secondary | ICD-10-CM | POA: Diagnosis not present

## 2016-01-17 DIAGNOSIS — G8929 Other chronic pain: Secondary | ICD-10-CM | POA: Diagnosis not present

## 2016-01-17 DIAGNOSIS — M170 Bilateral primary osteoarthritis of knee: Secondary | ICD-10-CM

## 2016-01-17 MED ORDER — LIDOCAINE HCL 1 % IJ SOLN
1.5000 mL | INTRAMUSCULAR | Status: AC | PRN
  Administered 2016-01-17: 1.5 mL

## 2016-01-17 MED ORDER — SODIUM HYALURONATE (VISCOSUP) 20 MG/2ML IX SOSY
20.0000 mg | PREFILLED_SYRINGE | INTRA_ARTICULAR | Status: AC | PRN
  Administered 2016-01-17: 20 mg via INTRA_ARTICULAR

## 2016-01-17 NOTE — Progress Notes (Signed)
   Procedure Note  Patient: Brittney Ray             Date of Birth: November 24, 1950           MRN: [id number]             Visit Date: 01/17/2016  Procedures: Visit Diagnoses: Primary osteoarthritis of both knees  Bilateral chronic knee pain  Large Joint Inj Date/Time: 01/17/2016 1:51 PM Performed by: Eliezer Lofts Authorized by: Eliezer Lofts   Consent Given by:  Patient Site marked: the procedure site was marked   Timeout: prior to procedure the correct patient, procedure, and site was verified   Indications:  Pain and joint swelling Location:  Knee Site:  R knee Prep: patient was prepped and draped in usual sterile fashion   Needle Size:  27 G Needle Length:  1.5 inches Approach:  Medial Ultrasound Guidance: No   Fluoroscopic Guidance: No   Arthrogram: No   Medications:  1.5 mL lidocaine 1 %; 20 mg Sodium Hyaluronate 20 MG/2ML Aspiration Attempted: Yes   Aspirate amount (mL):  0 Patient tolerance:  Patient tolerated the procedure well with no immediate complications  This is buy and build. Hyalgan No. 2 injected today without complication Large Joint Inj Date/Time: 01/17/2016 1:52 PM Performed by: Eliezer Lofts Authorized by: Eliezer Lofts   Consent Given by:  Patient Site marked: the procedure site was marked   Timeout: prior to procedure the correct patient, procedure, and site was verified   Indications:  Pain and joint swelling Location:  Knee Site:  L knee Prep: patient was prepped and draped in usual sterile fashion   Needle Size:  27 G Needle Length:  1.5 inches Approach:  Medial Ultrasound Guidance: No   Fluoroscopic Guidance: No   Arthrogram: No   Medications:  1.5 mL lidocaine 1 %; 20 mg Sodium Hyaluronate 20 MG/2ML Aspiration Attempted: Yes   Aspirate amount (mL):  0 Patient tolerance:  Patient tolerated the procedure well with no immediate complications  This is buy and build. Hyalgan No. 2 injected today without complication

## 2016-01-24 ENCOUNTER — Ambulatory Visit (INDEPENDENT_AMBULATORY_CARE_PROVIDER_SITE_OTHER): Payer: Medicare Other | Admitting: Rheumatology

## 2016-01-24 DIAGNOSIS — M170 Bilateral primary osteoarthritis of knee: Secondary | ICD-10-CM | POA: Diagnosis not present

## 2016-01-24 DIAGNOSIS — G8929 Other chronic pain: Secondary | ICD-10-CM

## 2016-01-24 DIAGNOSIS — M25561 Pain in right knee: Secondary | ICD-10-CM

## 2016-01-24 DIAGNOSIS — M25562 Pain in left knee: Secondary | ICD-10-CM

## 2016-01-24 MED ORDER — LIDOCAINE HCL 1 % IJ SOLN
1.5000 mL | INTRAMUSCULAR | Status: AC | PRN
  Administered 2016-01-24: 1.5 mL

## 2016-01-24 MED ORDER — SODIUM HYALURONATE (VISCOSUP) 20 MG/2ML IX SOSY
20.0000 mg | PREFILLED_SYRINGE | INTRA_ARTICULAR | Status: AC | PRN
  Administered 2016-01-24: 20 mg via INTRA_ARTICULAR

## 2016-01-24 NOTE — Progress Notes (Signed)
   Procedure Note  Patient: Brittney Ray             Date of Birth: Jan 02, 1951           MRN: [id number]             Visit Date: 01/24/2016  Procedures: Visit Diagnoses: No diagnosis found.  Large Joint Inj Date/Time: 01/24/2016 1:29 PM Performed by: Eliezer Lofts Authorized by: Eliezer Lofts   Consent Given by:  Patient Site marked: the procedure site was marked   Timeout: prior to procedure the correct patient, procedure, and site was verified   Indications:  Pain and joint swelling Location:  Knee Site:  R knee Prep: patient was prepped and draped in usual sterile fashion   Needle Size:  27 G Needle Length:  1.5 inches Approach:  Medial Ultrasound Guidance: No   Fluoroscopic Guidance: No   Arthrogram: No   Medications:  1.5 mL lidocaine 1 %; 20 mg Sodium Hyaluronate 20 MG/2ML Aspiration Attempted: Yes   Patient tolerance:  Patient tolerated the procedure well with no immediate complications  Patient is tolerating her injections well. Today her right knee Hyalgan No. 3 was given. Patient is 100% better after the second injection. Patient did very poorly with Euflexxa in the past. She is responding very well to Hyalgan. Large Joint Inj Date/Time: 01/24/2016 1:30 PM Performed by: Eliezer Lofts Authorized by: Eliezer Lofts   Consent Given by:  Patient Site marked: the procedure site was marked   Timeout: prior to procedure the correct patient, procedure, and site was verified   Indications:  Pain and joint swelling Location:  Knee Site:  L knee Prep: patient was prepped and draped in usual sterile fashion   Needle Size:  27 G Needle Length:  1.5 inches Approach:  Medial Ultrasound Guidance: No   Fluoroscopic Guidance: No   Arthrogram: No   Medications:  1.5 mL lidocaine 1 %; 20 mg Sodium Hyaluronate 20 MG/2ML Aspiration Attempted: Yes   Patient tolerance:  Patient tolerated the procedure well with no immediate complications  Patient is tolerating  her injections well. Today her left knee Hyalgan No. 3 was given. Patient is 100% better after the second injection. Patient did very poorly with Euflexxa in the past. She is responding very well to Hyalgan.

## 2016-01-31 ENCOUNTER — Ambulatory Visit: Payer: Self-pay | Admitting: Rheumatology

## 2016-02-07 ENCOUNTER — Ambulatory Visit (INDEPENDENT_AMBULATORY_CARE_PROVIDER_SITE_OTHER): Payer: Medicare Other | Admitting: Rheumatology

## 2016-02-07 DIAGNOSIS — M25562 Pain in left knee: Secondary | ICD-10-CM

## 2016-02-07 DIAGNOSIS — G8929 Other chronic pain: Secondary | ICD-10-CM

## 2016-02-07 DIAGNOSIS — M25561 Pain in right knee: Secondary | ICD-10-CM | POA: Diagnosis not present

## 2016-02-07 DIAGNOSIS — M170 Bilateral primary osteoarthritis of knee: Secondary | ICD-10-CM | POA: Diagnosis not present

## 2016-02-07 MED ORDER — SODIUM HYALURONATE (VISCOSUP) 20 MG/2ML IX SOSY
20.0000 mg | PREFILLED_SYRINGE | INTRA_ARTICULAR | Status: AC | PRN
  Administered 2016-02-07: 20 mg via INTRA_ARTICULAR

## 2016-02-07 MED ORDER — LIDOCAINE HCL 1 % IJ SOLN
1.5000 mL | INTRAMUSCULAR | Status: AC | PRN
  Administered 2016-02-07: 1.5 mL

## 2016-02-07 NOTE — Progress Notes (Signed)
   Office Visit Note  Patient: Brittney Ray             Date of Birth: 06-21-1950           MRN: [id number]             PCP: Dion Body, MD Referring: Dion Body, MD Visit Date: 02/07/2016 Occupation: @GUAROCC @      No Rheumatology ROS completed.   Objective: Vital Signs: There were no vitals taken for this visit.   Physical Exam     CDAI Exam: CDAI Homunculus Exam:   Joint Counts:  CDAI Tender Joint count: 0 CDAI Swollen Joint count: 0     Investigation: No additional findings.         Procedures:  Large Joint Inj Date/Time: 02/07/2016 1:18 PM Performed by: Eliezer Lofts Authorized by: Eliezer Lofts   Consent Given by:  Patient Site marked: the procedure site was marked   Timeout: prior to procedure the correct patient, procedure, and site was verified   Indications:  Pain and joint swelling Location:  Knee Site:  R knee Prep: patient was prepped and draped in usual sterile fashion   Needle Size:  27 G Needle Length:  1.5 inches Approach:  Medial Ultrasound Guidance: No   Fluoroscopic Guidance: No   Arthrogram: No   Medications:  1.5 mL lidocaine 1 %; 20 mg Sodium Hyaluronate 20 MG/2ML Aspiration Attempted: Yes   Aspirate amount (mL):  0 Patient tolerance:  Patient tolerated the procedure well with no immediate complications  Hyalgan #4 bilateral knees Buy and bill Large Joint Inj Date/Time: 02/07/2016 1:19 PM Performed by: Eliezer Lofts Authorized by: Eliezer Lofts   Consent Given by:  Patient Site marked: the procedure site was marked   Timeout: prior to procedure the correct patient, procedure, and site was verified   Indications:  Pain and joint swelling Location:  Knee Site:  L knee Prep: patient was prepped and draped in usual sterile fashion   Needle Size:  27 G Needle Length:  1.5 inches Approach:  Medial Ultrasound Guidance: No   Fluoroscopic Guidance: No   Arthrogram: No   Medications:  1.5 mL  lidocaine 1 %; 20 mg Sodium Hyaluronate 20 MG/2ML Aspiration Attempted: Yes   Aspirate amount (mL):  0 Patient tolerance:  Patient tolerated the procedure well with no immediate complications  Hyalgan #4 bilateral knees Buy and bill      [employer], PA-C  Note - This record has been created using [employer].  Chart creation errors have been sought, but may not always  have been located. Such creation errors do not reflect on  the standard of medical care.

## 2016-02-12 ENCOUNTER — Encounter: Payer: Self-pay | Admitting: Radiation Oncology

## 2016-02-12 ENCOUNTER — Ambulatory Visit
Admission: RE | Admit: 2016-02-12 | Discharge: 2016-02-12 | Disposition: A | Discharge status: Home / Self Care | Payer: Medicare Other | Source: Ambulatory Visit | Attending: Radiation Oncology | Admitting: Radiation Oncology

## 2016-02-12 VITALS — BP 142/78 | Temp 96.9°F | Resp 18 | Wt 201.1 lb

## 2016-02-12 DIAGNOSIS — Z923 Personal history of irradiation: Secondary | ICD-10-CM | POA: Insufficient documentation

## 2016-02-12 DIAGNOSIS — D0511 Intraductal carcinoma in situ of right breast: Secondary | ICD-10-CM

## 2016-02-12 DIAGNOSIS — Z86000 Personal history of in-situ neoplasm of breast: Secondary | ICD-10-CM | POA: Insufficient documentation

## 2016-02-12 NOTE — Progress Notes (Signed)
Radiation Oncology Follow up Note  Name: Brittney Ray   Date:   02/12/2016 MRN:  [id number] DOB: [birth year]    This 66 y.o. 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  REFERRING PROVIDER: Dion Body, MD  HPI: Patient is a 66 year old female now out 3/2 years having completed whole breast radiation to her right breast for 2 silk foci of ductal carcinoma in situ ER/PR positive. Seen today in routine follow-up she is doing well.. She has discontinued her tamoxifen based on side effect profile. She has had some calcifications in the side of her wide local excision and 6 month follow-up mammogram has been ordered for next month. She specifically denies breast tenderness cough or bone pain.  COMPLICATIONS OF TREATMENT: none  FOLLOW UP COMPLIANCE: keeps appointments   PHYSICAL EXAM:  BP (!) 142/78   Temp (!) 96.9 F (36.1 C)   Resp 18   Wt 201 lb 1 oz (91.2 kg)   BMI 37.99 kg/m  Lungs are clear to A&P cardiac examination essentially unremarkable with regular rate and rhythm. No dominant mass or nodularity is noted in either breast in 2 positions examined. Incision is well-healed. No axillary or supraclavicular adenopathy is appreciated. Cosmetic result is excellent. Well-developed well-nourished patient in NAD. HEENT reveals PERLA, EOMI, discs not visualized.  Oral cavity is clear. No oral mucosal lesions are identified. Neck is clear without evidence of cervical or supraclavicular adenopathy. Lungs are clear to A&P. Cardiac examination is essentially unremarkable with regular rate and rhythm without murmur rub or thrill. Abdomen is benign with no organomegaly or masses noted. Motor sensory and DTR levels are equal and symmetric in the upper and lower extremities. Cranial nerves II through XII are grossly intact. Proprioception is intact. No peripheral adenopathy or  edema is identified. No motor or sensory levels are noted. Crude visual fields are within normal range.  RADIOLOGY RESULTS: Serial mammograms are reviewed and compatible with the above-stated findings  PLAN: Present time she is doing well there keeping an eye and some microcalcifications in the side of her lumpectomy. Otherwise I believe she is doing well with no evidence of disease. I've asked to see her back in 1 year for follow-up and then will discontinue follow-up care. Patient knows to call with any concerns.  I would like to take this opportunity to thank you for allowing me to participate in the care of your patient.Armstead Peaks., MD

## 2016-02-14 ENCOUNTER — Ambulatory Visit (INDEPENDENT_AMBULATORY_CARE_PROVIDER_SITE_OTHER): Payer: Medicare Other | Admitting: Rheumatology

## 2016-02-14 DIAGNOSIS — M170 Bilateral primary osteoarthritis of knee: Secondary | ICD-10-CM | POA: Diagnosis not present

## 2016-02-14 DIAGNOSIS — M25561 Pain in right knee: Principal | ICD-10-CM

## 2016-02-14 DIAGNOSIS — G8929 Other chronic pain: Secondary | ICD-10-CM

## 2016-02-14 DIAGNOSIS — M25562 Pain in left knee: Principal | ICD-10-CM

## 2016-02-14 MED ORDER — LIDOCAINE HCL 1 % IJ SOLN
1.5000 mL | INTRAMUSCULAR | Status: AC | PRN
  Administered 2016-02-14: 1.5 mL

## 2016-02-14 MED ORDER — SODIUM HYALURONATE (VISCOSUP) 20 MG/2ML IX SOSY
20.0000 mg | PREFILLED_SYRINGE | INTRA_ARTICULAR | Status: AC | PRN
  Administered 2016-02-14: 20 mg via INTRA_ARTICULAR

## 2016-02-14 NOTE — Progress Notes (Signed)
   Procedure Note  Patient: Brittney Ray             Date of Birth: April 25, 1950           MRN: [id number]             Visit Date: 02/14/2016  Procedures: Visit Diagnoses: Bilateral chronic knee pain  Primary osteoarthritis of both knees  Large Joint Inj Date/Time: 02/14/2016 8:23 AM Performed by: Eliezer Lofts Authorized by: Eliezer Lofts   Consent Given by:  Patient Site marked: the procedure site was marked   Timeout: prior to procedure the correct patient, procedure, and site was verified   Indications:  Pain and joint swelling Location:  Knee Site:  R knee Prep: patient was prepped and draped in usual sterile fashion   Needle Size:  27 G Needle Length:  1.5 inches Approach:  Medial Ultrasound Guidance: No   Fluoroscopic Guidance: No   Arthrogram: No   Medications:  1.5 mL lidocaine 1 %; 20 mg Sodium Hyaluronate 20 MG/2ML Aspiration Attempted: Yes   Aspirate amount (mL):  0 Patient tolerance:  Patient tolerated the procedure well with no immediate complications  Hyalgan No. 5 Our Hyalgan Patient is doing exceptionally well with her injections. Last time the last injection lasted for about 1 year. I encouraged the patient to return to clinic for repeat injections when she starts feeling early signs of osteoarthritis we can restart the series. Were hopeful that it'll last at least 6 months but based on her previous history I suspect that she'll last from 9 months to 12 months. Large Joint Inj Date/Time: 02/14/2016 8:25 AM Performed by: Eliezer Lofts Authorized by: Eliezer Lofts   Consent Given by:  Patient Site marked: the procedure site was marked   Timeout: prior to procedure the correct patient, procedure, and site was verified   Indications:  Pain and joint swelling Location:  Knee Site:  L knee Prep: patient was prepped and draped in usual sterile fashion   Needle Size:  27 G Needle Length:  1.5 inches Approach:  Medial Ultrasound Guidance: No     Fluoroscopic Guidance: No   Arthrogram: No   Medications:  1.5 mL lidocaine 1 %; 20 mg Sodium Hyaluronate 20 MG/2ML Aspiration Attempted: Yes   Aspirate amount (mL):  0 Patient tolerance:  Patient tolerated the procedure well with no immediate complications  Hyalgan No. 5 Our Hyalgan Patient is doing exceptionally well with her injections. Last time the last injection lasted for about 1 year. I encouraged the patient to return to clinic for repeat injections when she starts feeling early signs of osteoarthritis we can restart the series. Were hopeful that it'll last at least 6 months but based on her previous history I suspect that she'll last from 9 months to 12 months.    Patient tolerated procedure well. There no complications. Keep follow-up appointment as scheduled

## 2016-03-02 ENCOUNTER — Encounter: Payer: Self-pay | Admitting: Hematology and Oncology

## 2016-03-02 ENCOUNTER — Inpatient Hospital Stay: Payer: Medicare Other | Attending: Hematology and Oncology

## 2016-03-02 ENCOUNTER — Inpatient Hospital Stay (HOSPITAL_BASED_OUTPATIENT_CLINIC_OR_DEPARTMENT_OTHER): Payer: Medicare Other | Admitting: Hematology and Oncology

## 2016-03-02 VITALS — BP 127/86 | HR 77 | Temp 97.8°F | Resp 18 | Wt 197.3 lb

## 2016-03-02 DIAGNOSIS — E119 Type 2 diabetes mellitus without complications: Secondary | ICD-10-CM | POA: Diagnosis not present

## 2016-03-02 DIAGNOSIS — I10 Essential (primary) hypertension: Secondary | ICD-10-CM | POA: Diagnosis not present

## 2016-03-02 DIAGNOSIS — F419 Anxiety disorder, unspecified: Secondary | ICD-10-CM | POA: Insufficient documentation

## 2016-03-02 DIAGNOSIS — M8580 Other specified disorders of bone density and structure, unspecified site: Secondary | ICD-10-CM | POA: Diagnosis not present

## 2016-03-02 DIAGNOSIS — Z9011 Acquired absence of right breast and nipple: Secondary | ICD-10-CM | POA: Diagnosis not present

## 2016-03-02 DIAGNOSIS — E039 Hypothyroidism, unspecified: Secondary | ICD-10-CM | POA: Insufficient documentation

## 2016-03-02 DIAGNOSIS — K449 Diaphragmatic hernia without obstruction or gangrene: Secondary | ICD-10-CM | POA: Diagnosis not present

## 2016-03-02 DIAGNOSIS — D0511 Intraductal carcinoma in situ of right breast: Secondary | ICD-10-CM

## 2016-03-02 DIAGNOSIS — Z170 Estrogen receptor positive status [ER+]: Secondary | ICD-10-CM | POA: Diagnosis not present

## 2016-03-02 DIAGNOSIS — Z923 Personal history of irradiation: Secondary | ICD-10-CM

## 2016-03-02 DIAGNOSIS — Z7982 Long term (current) use of aspirin: Secondary | ICD-10-CM | POA: Diagnosis not present

## 2016-03-02 DIAGNOSIS — Z803 Family history of malignant neoplasm of breast: Secondary | ICD-10-CM

## 2016-03-02 DIAGNOSIS — D0591 Unspecified type of carcinoma in situ of right breast: Secondary | ICD-10-CM

## 2016-03-02 DIAGNOSIS — K219 Gastro-esophageal reflux disease without esophagitis: Secondary | ICD-10-CM | POA: Diagnosis not present

## 2016-03-02 LAB — CBC WITH DIFFERENTIAL/PLATELET
Basophils Absolute: 0.1 10*3/uL (ref 0–0.1)
Basophils Relative: 1 %
Eosinophils Absolute: 0.2 10*3/uL (ref 0–0.7)
Eosinophils Relative: 2 %
HCT: 39.2 % (ref 35.0–47.0)
Hemoglobin: 13.3 g/dL (ref 12.0–16.0)
Lymphocytes Relative: 32 %
Lymphs Abs: 2.6 10*3/uL (ref 1.0–3.6)
MCH: 27.7 pg (ref 26.0–34.0)
MCHC: 33.9 g/dL (ref 32.0–36.0)
MCV: 81.7 fL (ref 80.0–100.0)
Monocytes Absolute: 0.7 10*3/uL (ref 0.2–0.9)
Monocytes Relative: 8 %
Neutro Abs: 4.7 10*3/uL (ref 1.4–6.5)
Neutrophils Relative %: 57 %
Platelets: 238 10*3/uL (ref 150–440)
RBC: 4.8 MIL/uL (ref 3.80–5.20)
RDW: 15 % — ABNORMAL HIGH (ref 11.5–14.5)
WBC: 8.2 10*3/uL (ref 3.6–11.0)

## 2016-03-02 NOTE — Progress Notes (Signed)
Patient here today for follow up regarding DCIS.  Patient's husband recently diagnosed with lung cancer and had surgery.  States she has not been doing well with her glucose and cholesterol.

## 2016-03-02 NOTE — Progress Notes (Signed)
[facility] day:  03/02/2016   Chief Complaint: Brittney Ray is a 66 y.o. female with DCIS who is seen for 6 month assessment.  HPI:  The patient was last seen in the medical oncology clinic on 08/29/2015.  At that time, she felt good.  Exam was stable.  Labs were inadvertantly not done.  Bilateral diagnostic right sided mammogram on 11/07/2015 revealed stable dystrophic appearing calcifications at the surgical site in the right breast. There was no mammographic evidence of malignancy in the right breast.  Recommendation was for bilateral diagnostic mammogram in 6 months.  During the interim, she denies any breast concerns.  She has had issues with her blood sugar and cholesterol.  Her husband is receiving treatment at the [facility] for lung cancer.   Past Medical History:  Diagnosis Date  . Anxiety    panic attacks; no curren med.  . Arthritis    knees  . Breast cancer ([facility]) 2014   RT LUMPECTOMY  . Diabetes mellitus    NIDDM  . GERD (gastroesophageal reflux disease)   . H/O hiatal hernia   . Heart murmur    states never had any problems  . Hypertension    under control, has been on med. > 10 yrs.  . Hypothyroidism   . Medial meniscus tear 04/2011   right knee  . Nocturia    3-4 x/night  . Paget's bone disease    hips  . Personal history of radiation therapy 2014   BREAST CA  . PONV (postoperative nausea and vomiting)     Past Surgical History:  Procedure Laterality Date  . ABDOMINAL HYSTERECTOMY  1978   partial  . BACK SURGERY  04/1990, 1996  . BREAST BIOPSY Right 2014   POS  . BREAST CYST ASPIRATION Left   . [place] PROCEDURE  09/1989   with rectal repair  . CHOLECYSTECTOMY  11/2005  . FOOT SURGERY  1992   left  . HAND SURGERY  10/1999   nerve repair right thumb  . KNEE SURGERY  07/31/2009   right  . KNEE SURGERY  2009   left  . THYROID LOBECTOMY  03/1992   left    Family History:  Sister had breast  cancer at age 40.  A first cousin had breast cancer.  An aunt had rectal cancer.  Social History:  reports that she has never smoked. She has never used smokeless tobacco. She reports that she does not drink alcohol or use drugs.  She livs in [place].  Her husband is receiving treatment for lung cancer at the [facility].  The patient is alone today.  Allergies:  Allergies  Allergen Reactions  . Contrast Media [Iodinated Diagnostic Agents] Hives and Cough  . Morphine And Related Other (See Comments)    JITTERY  . Codeine Other (See Comments) and Anxiety    Panic Attacks PANIC ATTACKS  . Sulfa Antibiotics Other (See Comments)    STOMACH IRRITATION    Current Medications: Current Outpatient Prescriptions  Medication Sig Dispense Refill  . aspirin 325 MG tablet Take 1 tablet by mouth daily.    . calcium carbonate (OS-CAL) 600 MG TABS Take 600 mg by mouth 2 (two) times daily with a meal.    . cholecalciferol (VITAMIN D) 1000 UNITS tablet Take 1,000 Units by mouth daily.    . clopidogrel (PLAVIX) 75 MG tablet TAKE 1 TABLET ONE TIME DAILY    . Cyanocobalamin (RA VITAMIN  B-12 TR) 1000 MCG TBCR Take by mouth.    . ferrous sulfate 325 (65 FE) MG tablet Take by mouth.    . fish oil-omega-3 fatty acids 1000 MG capsule Take 2 g by mouth daily.    Marland Kitchen glimepiride (AMARYL) 2 MG tablet TAKE 1 TABLET TWICE DAILY    . Lancet Devices (RELION LANCING DEVICE) MISC Use as directed.     Marland Kitchen levothyroxine (SYNTHROID, LEVOTHROID) 50 MCG tablet Take by mouth.    Marland Kitchen lisinopril-hydrochlorothiazide (PRINZIDE,ZESTORETIC) 10-12.5 MG tablet Take by mouth.    . metFORMIN (GLUCOPHAGE) 500 MG tablet Take 1,000 mg by mouth 2 (two) times daily.     . metoprolol tartrate (LOPRESSOR) 25 MG tablet TAKE 1/2 TABLET  TWICE A DAY    . pantoprazole (PROTONIX) 40 MG tablet TAKE 1 TABLET ONE TIME DAILY    . pravastatin (PRAVACHOL) 20 MG tablet TAKE 1 TABLET EVERY NIGHT    . albuterol (PROAIR HFA) 108 (90 BASE) MCG/ACT inhaler  Inhale 2 Inhalers into the lungs as needed.    . nitroGLYCERIN (NITROSTAT) 0.4 MG SL tablet Place under the tongue.     No current facility-administered medications for this visit.     Review of Systems:  GENERAL:  Feels "ok".  No fevers or sweats.  Weight down 4 pounds. PERFORMANCE STATUS (ECOG):  0 HEENT:  No visual changes, runny nose, sore throat, mouth sores or tenderness. Lungs: No shortness of breath or cough.  No hemoptysis. Cardiac:  No chest pain, palpitations, orthopnea, or PND. Breast:  No breast concerns.  No masses. GI:  No nausea, vomiting, diarrhea, constipation, melena or hematochezia. GU:  No urgency, frequency, dysuria, or hematuria. Musculoskeletal:  Paget's disease.  No back pain.  No muscle tenderness. Extremities:  No pain or swelling. Skin:  No rashes or skin changes. Neuro:  No headache, numbness or weakness, balance or coordination issues. Endocrine: Diabetes.  Thyroid disease on Synthroid.  No hot flashes or night sweats. Psych:  No mood changes, depression or anxiety. Pain:  No focal pain. Review of systems:  All other systems reviewed and found to be negative.  Physical Exam: Blood pressure 127/86, pulse 77, temperature 97.8 F (36.6 C), temperature source Tympanic, resp. rate 18, weight 197 lb 5 oz (89.5 kg). GENERAL:  Well developed, well nourished, woman sitting comfortably in the exam room in no acute distress. MENTAL STATUS:  Alert and oriented to person, place and time. HEAD:  Brown curly hair.  Normocephalic, atraumatic, face symmetric, no Cushingoid features. EYES:  Glasses.  Blue eyes.  Pupils equal round and reactive to light and accomodation.  No conjunctivitis or scleral icterus. ENT:  Oropharynx clear without lesion.  Tongue normal. Mucous membranes moist.  RESPIRATORY:  Clear to auscultation without rales, wheezes or rhonchi. CARDIOVASCULAR:  Regular rate and rhythm without murmur, rub or gallop. BREAST:  Right breast without masses, skin  changes or nipple discharge.  Tender in lower quadrants.  Left breast without masses, skin changes or nipple discharge.  ABDOMEN:  Soft, non-tender, with active bowel sounds, and no appreciable hepatosplenomegaly.  No masses. SKIN:  No rashes, ulcers or lesions. EXTREMITIES: No edema, no skin discoloration or tenderness.  No palpable cords. LYMPH NODES: No palpable cervical, supraclavicular, axillary or inguinal adenopathy  NEUROLOGICAL: Unremarkable. PSYCH:  Appropriate.   Appointment on 03/02/2016  Component Date Value Ref Range Status  . WBC 03/02/2016 8.2  3.6 - 11.0 K/uL Final  . RBC 03/02/2016 4.80  3.80 - 5.20 MIL/uL Final  .  Hemoglobin 03/02/2016 13.3  12.0 - 16.0 g/dL Final  . HCT 03/02/2016 39.2  35.0 - 47.0 % Final  . MCV 03/02/2016 81.7  80.0 - 100.0 fL Final  . MCH 03/02/2016 27.7  26.0 - 34.0 pg Final  . MCHC 03/02/2016 33.9  32.0 - 36.0 g/dL Final  . RDW 03/02/2016 15.0* 11.5 - 14.5 % Final  . Platelets 03/02/2016 238  150 - 440 K/uL Final  . Neutrophils Relative % 03/02/2016 57  % Final  . Neutro Abs 03/02/2016 4.7  1.4 - 6.5 K/uL Final  . Lymphocytes Relative 03/02/2016 32  % Final  . Lymphs Abs 03/02/2016 2.6  1.0 - 3.6 K/uL Final  . Monocytes Relative 03/02/2016 8  % Final  . Monocytes Absolute 03/02/2016 0.7  0.2 - 0.9 K/uL Final  . Eosinophils Relative 03/02/2016 2  % Final  . Eosinophils Absolute 03/02/2016 0.2  0 - 0.7 K/uL Final  . Basophils Relative 03/02/2016 1  % Final  . Basophils Absolute 03/02/2016 0.1  0 - 0.1 K/uL Final    Assessment:  CERIA GARING is a 66 y.o. female with a history of right breast DCIS.  She underwent right partial mastectomy for 2 separate foci of DCIS on 03/14/2012.  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  She completed radiation in 11/2012. She was on tamoxifen for one year , then switched to Femara secondary to severe hot flashes.  She stopped taking Femara in 05/2013.  She  declines further hormonal therapy.  Bilateral mammogram on 05/07/2015 revealed a a few developing coarse calcifications within the scar in the right breast.  Bone density study on 10/01/2014 revealed mild osteopenia with a T score of -1.1 in the right femur.   She has a family history of breast cancer (sister age 69 and 1st cousin).  She has not had genetic testing secondary to cost issues.  Symptomatically, she feels "ok".  Her husband is going through lung cancer treatment.  Exam is stable.  Plan: 1.  Labs today:  CBC with diff, CMP. 2.  Diagnostic bilateral mammogram on 05/07/2016. 3.  RTC in 6 months for MD assessment, labs (CBC with diff, CMP), and review of mammogram.    Lequita Asal, MD 03/02/2016, 3:19 PM

## 2016-05-07 ENCOUNTER — Ambulatory Visit
Admission: RE | Admit: 2016-05-07 | Discharge: 2016-05-07 | Disposition: A | Discharge status: Home / Self Care | Payer: Medicare Other | Source: Ambulatory Visit | Attending: Hematology and Oncology | Admitting: Hematology and Oncology

## 2016-05-07 DIAGNOSIS — D0591 Unspecified type of carcinoma in situ of right breast: Secondary | ICD-10-CM

## 2016-08-30 NOTE — Progress Notes (Signed)
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  HPI:  The patient was last seen in the medical oncology clinic on 03/02/2016.  At that time, she denied any breast concerns.  CBC was normal.  Bilateral mammogram on 05/07/2016 revealed stable probably benign dystrophic calcifications at the right breast lumpectomy site.  The right breast lumpectomy site was stable.  There was no mammographic evidence of malignancy in the bilateral breasts.  Diagnostic mammogram was suggested in 1 year.   The patient has a scheduled follow up with Dr. Tamala Julian, her surgeon, next week.   During the interim, patient reports that she has had increasing fatigue. She is under the care of Dr. Ubaldo Glassing. Plans are to have a stress test on 09/09/2016. Patient reports increased stress. She lost her daughter on 08/07/2016 and a long term pet on 08/22/2016. Patient's husband is being treated for cancer at the [facility] at this time as well.    Past Medical History:  Diagnosis Date  . Anxiety    panic attacks; no curren med.  . Arthritis    knees  . Breast cancer ([place]) 2014   RT LUMPECTOMY  . Diabetes mellitus    NIDDM  . GERD (gastroesophageal reflux disease)   . H/O hiatal hernia   . Heart murmur    states never had any problems  . Hypertension    under control, has been on med. > 10 yrs.  . Hypothyroidism   . Medial meniscus tear 04/2011   right knee  . Nocturia    3-4 x/night  . Paget's bone disease    hips  . Personal history of radiation therapy 2014   BREAST CA  . PONV (postoperative nausea and vomiting)     Past Surgical History:  Procedure Laterality Date  . ABDOMINAL HYSTERECTOMY  1978   partial  . BACK SURGERY  04/1990, 1996  . BREAST BIOPSY Right 2014   POS  . BREAST CYST ASPIRATION Left   . BREAST LUMPECTOMY Right   . [place] PROCEDURE  09/1989   with rectal repair   . CHOLECYSTECTOMY  11/2005  . FOOT SURGERY  1992   left  . HAND SURGERY  10/1999   nerve repair right thumb  . KNEE SURGERY  07/31/2009   right  . KNEE SURGERY  2009   left  . THYROID LOBECTOMY  03/1992   left    Family History:  Sister had breast cancer at age 75.  A first cousin had breast cancer.  An aunt had rectal cancer.  Social History:  reports that she has never smoked. She has never used smokeless tobacco. She reports that she does not drink alcohol or use drugs.  She livs in [place].  Her husband is receiving treatment for lung cancer at the [facility] in [place].  The patient is alone today.  Allergies:  Allergies  Allergen Reactions  . Contrast Media [Iodinated Diagnostic Agents] Hives and Cough  . Morphine And Related Other (See Comments)    JITTERY  . Codeine Other (See Comments) and Anxiety    Panic Attacks PANIC ATTACKS  . Sulfa Antibiotics Other (See Comments)    STOMACH IRRITATION    Current Medications: Current Outpatient Prescriptions  Medication Sig Dispense Refill  . albuterol (PROAIR HFA) 108 (90 BASE) MCG/ACT inhaler Inhale 2 Inhalers into the lungs as needed.    Marland Kitchen  aspirin 325 MG tablet Take 1 tablet by mouth daily.    . calcium carbonate (OS-CAL) 600 MG TABS Take 600 mg by mouth 2 (two) times daily with a meal.    . cholecalciferol (VITAMIN D) 1000 UNITS tablet Take 1,000 Units by mouth daily.    . clopidogrel (PLAVIX) 75 MG tablet TAKE 1 TABLET ONE TIME DAILY    . Cyanocobalamin (RA VITAMIN B-12 TR) 1000 MCG TBCR Take by mouth.    . fenofibrate 160 MG tablet     . ferrous sulfate 325 (65 FE) MG tablet Take by mouth.    . fish oil-omega-3 fatty acids 1000 MG capsule Take 2 g by mouth daily.    Marland Kitchen glimepiride (AMARYL) 2 MG tablet TAKE 1 TABLET TWICE DAILY    . Lancet Devices (RELION LANCING DEVICE) MISC Use as directed.     Marland Kitchen levothyroxine (SYNTHROID, LEVOTHROID) 50 MCG tablet Take by mouth.    Marland Kitchen lisinopril-hydrochlorothiazide  (PRINZIDE,ZESTORETIC) 10-12.5 MG tablet Take by mouth.    . metFORMIN (GLUCOPHAGE) 500 MG tablet Take 1,000 mg by mouth 2 (two) times daily.     . metoprolol tartrate (LOPRESSOR) 25 MG tablet TAKE 1/2 TABLET  TWICE A DAY    . nitroGLYCERIN (NITROSTAT) 0.4 MG SL tablet Place under the tongue.    . pantoprazole (PROTONIX) 40 MG tablet TAKE 1 TABLET ONE TIME DAILY    . pravastatin (PRAVACHOL) 20 MG tablet TAKE 1 TABLET EVERY NIGHT     No current facility-administered medications for this visit.     Review of Systems:  GENERAL:  Increased fatigue.  Tires out easily.  No fevers or sweats.  Weight up 3 pounds PERFORMANCE STATUS (ECOG):  0 HEENT:  No visual changes, runny nose, sore throat, mouth sores or tenderness. Lungs: No shortness of breath or cough.  No hemoptysis. Cardiac:  No chest pain, palpitations, orthopnea, or PND.  Stress test scheduled. Breast:  No breast concerns.  No masses. GI:  No nausea, vomiting, diarrhea, constipation, melena or hematochezia. GU:  No urgency, frequency, dysuria, or hematuria. Musculoskeletal:  Paget's disease.  No back pain.  No muscle tenderness. Extremities:  No pain or swelling. Skin:  No rashes or skin changes. Neuro:  No headache, numbness or weakness, balance or coordination issues. Endocrine: Diabetes.  Thyroid disease on Synthroid.  No hot flashes or night sweats. Psych:  Stress.  No mood changes, depression or anxiety.  Pain:  No focal pain. Review of systems:  All other systems reviewed and found to be negative.  Physical Exam: Blood pressure 131/83, pulse 66, temperature (!) 97.3 F (36.3 C), temperature source Tympanic, resp. rate 18, weight 200 lb 1.6 oz (90.8 kg). GENERAL:  Well developed, well nourished, woman sitting comfortably in the exam room in no acute distress. MENTAL STATUS:  Alert and oriented to person, place and time. HEAD:  Brown curly hair.  Normocephalic, atraumatic, face symmetric, no Cushingoid features. EYES:  Glasses.   Blue eyes.  Pupils equal round and reactive to light and accomodation.  No conjunctivitis or scleral icterus. ENT:  Oropharynx clear without lesion.  Tongue normal. Mucous membranes moist.  RESPIRATORY:  Clear to auscultation without rales, wheezes or rhonchi. CARDIOVASCULAR:  Regular rate and rhythm without murmur, rub or gallop. BREAST:  Right breast without masses, skin changes or nipple discharge.  Tender lower quadrants.  Left breast without masses, skin changes or nipple discharge.  ABDOMEN:  Soft, non-tender, with active bowel sounds, and no appreciable hepatosplenomegaly.  No masses.  SKIN:  No rashes, ulcers or lesions. EXTREMITIES: No edema, no skin discoloration or tenderness.  No palpable cords. LYMPH NODES: No palpable cervical, supraclavicular, axillary or inguinal adenopathy  NEUROLOGICAL: Unremarkable. PSYCH:  Appropriate.   Appointment on 08/31/2016  Component Date Value Ref Range Status  . WBC 08/31/2016 6.1  3.6 - 11.0 K/uL Final  . RBC 08/31/2016 4.54  3.80 - 5.20 MIL/uL Final  . Hemoglobin 08/31/2016 12.7  12.0 - 16.0 g/dL Final  . HCT 08/31/2016 37.4  35.0 - 47.0 % Final  . MCV 08/31/2016 82.3  80.0 - 100.0 fL Final  . MCH 08/31/2016 28.1  26.0 - 34.0 pg Final  . MCHC 08/31/2016 34.1  32.0 - 36.0 g/dL Final  . RDW 08/31/2016 14.7* 11.5 - 14.5 % Final  . Platelets 08/31/2016 259  150 - 440 K/uL Final  . Neutrophils Relative % 08/31/2016 59  % Final  . Neutro Abs 08/31/2016 3.6  1.4 - 6.5 K/uL Final  . Lymphocytes Relative 08/31/2016 30  % Final  . Lymphs Abs 08/31/2016 1.8  1.0 - 3.6 K/uL Final  . Monocytes Relative 08/31/2016 8  % Final  . Monocytes Absolute 08/31/2016 0.5  0.2 - 0.9 K/uL Final  . Eosinophils Relative 08/31/2016 2  % Final  . Eosinophils Absolute 08/31/2016 0.1  0 - 0.7 K/uL Final  . Basophils Relative 08/31/2016 1  % Final  . Basophils Absolute 08/31/2016 0.1  0 - 0.1 K/uL Final  . Sodium 08/31/2016 136  135 - 145 mmol/L Final  . Potassium  08/31/2016 4.3  3.5 - 5.1 mmol/L Final  . Chloride 08/31/2016 102  101 - 111 mmol/L Final  . CO2 08/31/2016 24  22 - 32 mmol/L Final  . Glucose, Bld 08/31/2016 179* 65 - 99 mg/dL Final  . BUN 08/31/2016 26* 6 - 20 mg/dL Final  . Creatinine, Ser 08/31/2016 1.22* 0.44 - 1.00 mg/dL Final  . Calcium 08/31/2016 9.5  8.9 - 10.3 mg/dL Final  . Total Protein 08/31/2016 7.5  6.5 - 8.1 g/dL Final  . Albumin 08/31/2016 4.2  3.5 - 5.0 g/dL Final  . AST 08/31/2016 30  15 - 41 U/L Final  . ALT 08/31/2016 39  14 - 54 U/L Final  . Alkaline Phosphatase 08/31/2016 31* 38 - 126 U/L Final  . Total Bilirubin 08/31/2016 0.7  0.3 - 1.2 mg/dL Final  . GFR calc non Af Amer 08/31/2016 45* >60 mL/min Final  . GFR calc Af Amer 08/31/2016 52* >60 mL/min Final   Comment: (NOTE) The eGFR has been calculated using the CKD EPI equation. This calculation has not been validated in all clinical situations. eGFR's persistently <60 mL/min signify possible Chronic Kidney Disease.   . Anion gap 08/31/2016 10  5 - 15 Final    Assessment:  Brittney Ray is a 66 y.o. female with a history of right breast DCIS.  She underwent right partial mastectomy for 2 separate foci of DCIS on 03/14/2012.  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  She completed radiation in 11/2012. She was on tamoxifen for one year , then switched to Femara secondary to severe hot flashes.  She stopped taking Femara in 05/2013.  She declines further hormonal therapy.  Bilateral mammogram on 05/07/2015 revealed a a few developing coarse calcifications within the scar in the right breast.  Bilateral mammogram on 05/07/2016 revealed stable probably benign dystrophic calcifications at the right breast lumpectomy site.  The right  breast lumpectomy site was .  There was no mammographic evidence of malignancy in the bilateral breasts.   Bone density study on 10/01/2014 revealed mild osteopenia with a T score of -1.1 in  the right femur.   She has a family history of breast cancer (sister age 40 and 1st cousin).  She has not had genetic testing secondary to cost issues.  Symptomatically, she feels "ok".  Her husband is going through lung cancer treatment.  She lost her daughter.  Exam is stable. Labs today are normal.   Plan: 1.  Labs today:  CBC with diff, CMP. 2.  Review interval mammogram- probable benign calcifications.  Recommendation for follow-up in 1 year. 3.  Bone density on 09/30/2016. 4.  Continue calcium and vitamin D. 5.  RTC in 6 months for MD assessment and labs (CBC with diff, CMP).    Melissa C. Mike Gip, MD 08/31/2016, 10:00 AM

## 2016-08-31 ENCOUNTER — Inpatient Hospital Stay: Payer: Medicare Other | Attending: Hematology and Oncology

## 2016-08-31 ENCOUNTER — Encounter: Payer: Self-pay | Admitting: Hematology and Oncology

## 2016-08-31 ENCOUNTER — Inpatient Hospital Stay (HOSPITAL_BASED_OUTPATIENT_CLINIC_OR_DEPARTMENT_OTHER): Payer: Medicare Other | Admitting: Hematology and Oncology

## 2016-08-31 VITALS — BP 131/83 | HR 66 | Temp 97.3°F | Resp 18 | Wt 200.1 lb

## 2016-08-31 DIAGNOSIS — Z923 Personal history of irradiation: Secondary | ICD-10-CM | POA: Insufficient documentation

## 2016-08-31 DIAGNOSIS — Z803 Family history of malignant neoplasm of breast: Secondary | ICD-10-CM

## 2016-08-31 DIAGNOSIS — Z9011 Acquired absence of right breast and nipple: Secondary | ICD-10-CM | POA: Diagnosis not present

## 2016-08-31 DIAGNOSIS — D0511 Intraductal carcinoma in situ of right breast: Secondary | ICD-10-CM | POA: Diagnosis not present

## 2016-08-31 DIAGNOSIS — Z79899 Other long term (current) drug therapy: Secondary | ICD-10-CM | POA: Diagnosis not present

## 2016-08-31 DIAGNOSIS — Z7984 Long term (current) use of oral hypoglycemic drugs: Secondary | ICD-10-CM | POA: Insufficient documentation

## 2016-08-31 DIAGNOSIS — E039 Hypothyroidism, unspecified: Secondary | ICD-10-CM | POA: Insufficient documentation

## 2016-08-31 DIAGNOSIS — K449 Diaphragmatic hernia without obstruction or gangrene: Secondary | ICD-10-CM | POA: Insufficient documentation

## 2016-08-31 DIAGNOSIS — I10 Essential (primary) hypertension: Secondary | ICD-10-CM | POA: Diagnosis not present

## 2016-08-31 DIAGNOSIS — D0591 Unspecified type of carcinoma in situ of right breast: Secondary | ICD-10-CM

## 2016-08-31 DIAGNOSIS — Z7982 Long term (current) use of aspirin: Secondary | ICD-10-CM | POA: Insufficient documentation

## 2016-08-31 DIAGNOSIS — M8580 Other specified disorders of bone density and structure, unspecified site: Secondary | ICD-10-CM | POA: Insufficient documentation

## 2016-08-31 DIAGNOSIS — K219 Gastro-esophageal reflux disease without esophagitis: Secondary | ICD-10-CM | POA: Insufficient documentation

## 2016-08-31 DIAGNOSIS — F419 Anxiety disorder, unspecified: Secondary | ICD-10-CM | POA: Diagnosis not present

## 2016-08-31 DIAGNOSIS — E119 Type 2 diabetes mellitus without complications: Secondary | ICD-10-CM | POA: Diagnosis not present

## 2016-08-31 LAB — COMPREHENSIVE METABOLIC PANEL
ALT: 39 U/L (ref 14–54)
AST: 30 U/L (ref 15–41)
Albumin: 4.2 g/dL (ref 3.5–5.0)
Alkaline Phosphatase: 31 U/L — ABNORMAL LOW (ref 38–126)
Anion gap: 10 (ref 5–15)
BUN: 26 mg/dL — ABNORMAL HIGH (ref 6–20)
CO2: 24 mmol/L (ref 22–32)
Calcium: 9.5 mg/dL (ref 8.9–10.3)
Chloride: 102 mmol/L (ref 101–111)
Creatinine, Ser: 1.22 mg/dL — ABNORMAL HIGH (ref 0.44–1.00)
GFR calc Af Amer: 52 mL/min — ABNORMAL LOW (ref >60)
GFR calc non Af Amer: 45 mL/min — ABNORMAL LOW (ref >60)
Glucose, Bld: 179 mg/dL — ABNORMAL HIGH (ref 65–99)
Potassium: 4.3 mmol/L (ref 3.5–5.1)
Sodium: 136 mmol/L (ref 135–145)
Total Bilirubin: 0.7 mg/dL (ref 0.3–1.2)
Total Protein: 7.5 g/dL (ref 6.5–8.1)

## 2016-08-31 LAB — CBC WITH DIFFERENTIAL/PLATELET
Basophils Absolute: 0.1 10*3/uL (ref 0–0.1)
Basophils Relative: 1 %
Eosinophils Absolute: 0.1 10*3/uL (ref 0–0.7)
Eosinophils Relative: 2 %
HCT: 37.4 % (ref 35.0–47.0)
Hemoglobin: 12.7 g/dL (ref 12.0–16.0)
Lymphocytes Relative: 30 %
Lymphs Abs: 1.8 10*3/uL (ref 1.0–3.6)
MCH: 28.1 pg (ref 26.0–34.0)
MCHC: 34.1 g/dL (ref 32.0–36.0)
MCV: 82.3 fL (ref 80.0–100.0)
Monocytes Absolute: 0.5 10*3/uL (ref 0.2–0.9)
Monocytes Relative: 8 %
Neutro Abs: 3.6 10*3/uL (ref 1.4–6.5)
Neutrophils Relative %: 59 %
Platelets: 259 10*3/uL (ref 150–440)
RBC: 4.54 MIL/uL (ref 3.80–5.20)
RDW: 14.7 % — ABNORMAL HIGH (ref 11.5–14.5)
WBC: 6.1 10*3/uL (ref 3.6–11.0)

## 2016-10-07 ENCOUNTER — Ambulatory Visit
Admission: RE | Admit: 2016-10-07 | Discharge: 2016-10-07 | Disposition: A | Discharge status: Home / Self Care | Payer: Medicare Other | Source: Ambulatory Visit | Attending: Urgent Care | Admitting: Urgent Care

## 2016-10-07 ENCOUNTER — Telehealth: Payer: Self-pay | Admitting: *Deleted

## 2016-10-07 DIAGNOSIS — D0591 Unspecified type of carcinoma in situ of right breast: Secondary | ICD-10-CM | POA: Diagnosis present

## 2016-10-07 DIAGNOSIS — M8588 Other specified disorders of bone density and structure, other site: Secondary | ICD-10-CM | POA: Diagnosis not present

## 2016-10-07 DIAGNOSIS — M8580 Other specified disorders of bone density and structure, unspecified site: Secondary | ICD-10-CM

## 2016-10-07 NOTE — Telephone Encounter (Signed)
Called patient to inform her that her bone density revealed persistent osteopenia.  Per Honor Loh, NP patient should take Calcium 1200 mg daily and Vitamin D 800 mg daily.  Patient verbalized understanding.

## 2016-10-07 NOTE — Telephone Encounter (Signed)
-----   Message from Lequita Asal, MD sent at 10/07/2016  4:27 PM EDT ----- Regarding: Please call patient  Bone density reveals persistent osteopenia.  M  ----- Message ----- From: Karen Kitchens, NP Sent: 10/07/2016   3:01 PM To: Lequita Asal, MD    ----- Message ----- From: Interface, Rad Results In Sent: 10/07/2016   2:24 PM To: Karen Kitchens, NP

## 2017-02-15 ENCOUNTER — Encounter: Payer: Self-pay | Admitting: Radiation Oncology

## 2017-02-15 ENCOUNTER — Other Ambulatory Visit: Payer: Self-pay

## 2017-02-15 ENCOUNTER — Ambulatory Visit
Admission: RE | Admit: 2017-02-15 | Discharge: 2017-02-15 | Disposition: A | Discharge status: Home / Self Care | Payer: Medicare Other | Source: Ambulatory Visit | Attending: Radiation Oncology | Admitting: Radiation Oncology

## 2017-02-15 VITALS — BP 182/99 | HR 80 | Temp 97.2°F | Resp 20 | Wt 198.0 lb

## 2017-02-15 DIAGNOSIS — Z86000 Personal history of in-situ neoplasm of breast: Secondary | ICD-10-CM | POA: Insufficient documentation

## 2017-02-15 DIAGNOSIS — D0511 Intraductal carcinoma in situ of right breast: Secondary | ICD-10-CM

## 2017-02-15 DIAGNOSIS — Z923 Personal history of irradiation: Secondary | ICD-10-CM | POA: Diagnosis not present

## 2017-02-15 NOTE — Progress Notes (Signed)
Radiation Oncology Follow up Note  Name: Brittney Ray   Date:   02/15/2017 MRN:  [id number] DOB: 1950-08-02    This 67 y.o. 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  REFERRING PROVIDER: Dion Body, MD  HPI: Patient is a 67 year old female now out 4.5 years having completed whole breast radiation to her right breast were ER PR positive ductal carcinoma in situ.  She is seen today in routine follow-up is doing well.  She specifically denies breast tenderness cough or bone pain..  She has discontinued Femara.  This was based on the side effect profile.  She had mammograms back in April showing some benign dystrophic calcifications of the right breast lumpectomy site which are stable.  COMPLICATIONS OF TREATMENT: none  FOLLOW UP COMPLIANCE: keeps appointments   PHYSICAL EXAM:  BP (!) 182/99   Pulse 80   Temp (!) 97.2 F (36.2 C)   Resp 20   Wt 197 lb 15.6 oz (89.8 kg)   BMI 37.41 kg/m  Lungs are clear to A&P cardiac examination essentially unremarkable with regular rate and rhythm. No dominant mass or nodularity is noted in either breast in 2 positions examined. Incision is well-healed. No axillary or supraclavicular adenopathy is appreciated. Cosmetic result is excellent. Well-developed well-nourished patient in NAD. HEENT reveals PERLA, EOMI, discs not visualized.  Oral cavity is clear. No oral mucosal lesions are identified. Neck is clear without evidence of cervical or supraclavicular adenopathy. Lungs are clear to A&P. Cardiac examination is essentially unremarkable with regular rate and rhythm without murmur rub or thrill. Abdomen is benign with no organomegaly or masses noted. Motor sensory and DTR levels are equal and symmetric in the upper and lower extremities. Cranial nerves II through XII are grossly intact. Proprioception is intact. No peripheral  adenopathy or edema is identified. No motor or sensory levels are noted. Crude visual fields are within normal range.  RADIOLOGY RESULTS: Mammograms are reviewed and compatible with the above-stated findings  PLAN: Patient continues to do well with no evidence of disease.  I have discontinued follow-up care now that she is close to 5 years out.  I would be happy to reevaluate her at any time and she knows to call with any questions.  I would like to take this opportunity to thank you for allowing me to participate in the care of your patient.Noreene Filbert, MD

## 2017-02-24 ENCOUNTER — Telehealth: Payer: Self-pay | Admitting: Rheumatology

## 2017-02-24 NOTE — Telephone Encounter (Signed)
Patient advised we would be able to give her a 6 month handicap placard and if she continues to have pain then she will need to schedule a follow up appointment. Patient verbalized understanding. Handicap placard paper sent to patient.

## 2017-02-24 NOTE — Telephone Encounter (Signed)
Patient was last seen for an actual visit in 09/2015. Patient had her series of Hyalgan injection st the end of 2017 and beginning of 2018. Patient called stating she is doing okay and feels she doesn't need to be seen but would like to have her handicap placard renewed. Please advise.

## 2017-02-24 NOTE — Telephone Encounter (Signed)
Patient is calling regarding her handicap sticker renewal.  Patient is "feeling okay" and doesn't need an appointment but isn't sure if Dr. Estanislado Pandy will approve the paperwork without an office visit.

## 2017-02-24 NOTE — Telephone Encounter (Signed)
Ok to give patient a 81-month handicap tag.  If she continues to have worsening knee pain and/or swelling, please advise her to schedule a return office visit.

## 2017-03-03 NOTE — Progress Notes (Signed)
Office Visit Note  Patient: Brittney Ray             Date of Birth: 12-19-1950           MRN: [id number]             PCP: Dion Body, MD Referring: Dion Body, MD Visit Date: 03/04/2017 Occupation: @GUAROCC @    Subjective: Knee and hip pain.Marland Kitchen   History of Present Illness: Brittney Ray is a 67 y.o. female with history of osteoarthritis, disc disease.  She states she is doing much better after the Visco supplement injections to her knee joints in February 2018.  She would like to reschedule the Visco supplement injections now.  She continues to have pain and discomfort in her bilateral trochanteric bursa.  She had an adequate response to cortisone injections in the past.  She is also diabetic and would like to avoid cortisone injections.  She has a lot of difficulty walking long distance and would like to have a permanent handicap placard.  Lower back pain is better.  Activities of Daily Living:  Patient reports morning stiffness for 2 minutes.   Patient Reports nocturnal pain.  Difficulty dressing/grooming: Denies Difficulty climbing stairs: Reports Difficulty getting out of chair: Reports Difficulty using hands for taps, buttons, cutlery, and/or writing: Reports   Review of Systems  Constitutional: Negative for fatigue, night sweats, weight gain, weight loss and weakness.  HENT: Negative for mouth sores, trouble swallowing, trouble swallowing, mouth dryness and nose dryness.   Eyes: Negative for pain, redness, visual disturbance and dryness.  Respiratory: Negative for cough, shortness of breath and difficulty breathing.   Cardiovascular: Negative for chest pain, palpitations, hypertension, irregular heartbeat and swelling in legs/feet.  Gastrointestinal: Negative for blood in stool, constipation and diarrhea.  Endocrine: Negative for increased urination.  Genitourinary: Negative for vaginal dryness.  Musculoskeletal: Positive for arthralgias, joint  pain and morning stiffness. Negative for joint swelling, myalgias, muscle weakness, muscle tenderness and myalgias.  Skin: Negative for color change, rash, hair loss, skin tightness, ulcers and sensitivity to sunlight.  Allergic/Immunologic: Negative for susceptible to infections.  Neurological: Negative for dizziness, memory loss and night sweats.  Hematological: Negative for swollen glands.  Psychiatric/Behavioral: Negative for depressed mood and sleep disturbance. The patient is not nervous/anxious.     PMFS History:  Patient Active Problem List   Diagnosis Date Noted  . Osteopenia of neck of right femur 03/04/2017  . Primary osteoarthritis of both knees 01/10/2016  . Chronic pain of both knees 01/10/2016  . Obesity (BMI 30-39.9) 03/25/2015  . Multinodular goiter 03/04/2015  . Vaccine counseling 10/24/2014  . Postprocedural hypothyroidism 10/08/2014  . Type 2 diabetes mellitus without complication, without long-term current use of insulin ([place]) 10/08/2014  . Breast cancer in situ 08/30/2014  . Coronary artery disease 08/17/2013  . NSTEMI (non-ST elevated myocardial infarction) ([place]) 08/17/2013  . Acquired hypothyroidism 02/13/2013  . Diabetes mellitus type 2, uncomplicated ([place]) 81/44/8185  . Essential hypertension 02/13/2013  . Mixed hyperlipidemia 02/13/2013  . Personal history of breast cancer 02/13/2013    Past Medical History:  Diagnosis Date  . Anxiety    panic attacks; no curren med.  . Arthritis    knees  . Breast cancer ([place]) 2014   RT LUMPECTOMY  . Diabetes mellitus    NIDDM  . GERD (gastroesophageal reflux disease)   . H/O hiatal hernia   . Heart murmur    states never had any problems  . Hypertension  under control, has been on med. > 10 yrs.  . Hypothyroidism   . Medial meniscus tear 04/2011   right knee  . Nocturia    3-4 x/night  . Paget's bone disease    hips  . Personal history of radiation therapy 2014   BREAST CA  . PONV (postoperative  nausea and vomiting)     Family History  Problem Relation Age of Onset  . Breast cancer Sister 1  . Breast cancer Cousin    Past Surgical History:  Procedure Laterality Date  . ABDOMINAL HYSTERECTOMY  1978   partial  . BACK SURGERY  04/1990, 1996  . BREAST BIOPSY Right 2014   POS  . BREAST CYST ASPIRATION Left   . BREAST LUMPECTOMY Right   . [place] PROCEDURE  09/1989   with rectal repair  . CHOLECYSTECTOMY  11/2005  . FOOT SURGERY  1992   left  . HAND SURGERY  10/1999   nerve repair right thumb  . KNEE SURGERY  07/31/2009   right  . KNEE SURGERY  2009   left  . THYROID LOBECTOMY  03/1992   left   Social History   Social History Narrative  . Not on file     Objective: Vital Signs: BP (!) 159/81 (BP Location: Left Arm, Patient Position: Sitting, Cuff Size: Large)   Pulse 78   Resp 12   Wt 200 lb (90.7 kg)   BMI 37.79 kg/m    Physical Exam  Constitutional: She is oriented to person, place, and time. She appears well-developed and well-nourished.  HENT:  Head: Normocephalic and atraumatic.  Eyes: Conjunctivae and EOM are normal.  Neck: Normal range of motion.  Cardiovascular: Normal rate, regular rhythm, normal heart sounds and intact distal pulses.  Pulmonary/Chest: Effort normal and breath sounds normal.  Abdominal: Soft. Bowel sounds are normal.  Lymphadenopathy:    She has no cervical adenopathy.  Neurological: She is alert and oriented to person, place, and time.  Skin: Skin is warm and dry. Capillary refill takes less than 2 seconds.  Psychiatric: She has a normal mood and affect. Her behavior is normal.  Nursing note and vitals reviewed.    Musculoskeletal Exam: C-spine thoracic spine good range of motion.  She is some limitation of range of motion of the lumbar spine without discomfort.  She has good range of motion of shoulders elbows wrist joint MCPs PIPs DIPs.  She has tenderness over bilateral trochanteric bursa consistent with trochanteric bursitis.   She is crepitus with range of motion of bilateral knee joints without any warmth swelling or effusion.  CDAI Exam: No CDAI exam completed.    Investigation: No additional findings. CBC Latest Ref Rng & Units 03/04/2017 08/31/2016 03/02/2016  WBC 3.6 - 11.0 K/uL 6.9 6.1 8.2  Hemoglobin 12.0 - 16.0 g/dL 12.5 12.7 13.3  Hematocrit 35.0 - 47.0 % 38.0 37.4 39.2  Platelets 150 - 440 K/uL 242 259 238   CMP Latest Ref Rng & Units 03/04/2017 08/31/2016 02/28/2015  Glucose 65 - 99 mg/dL 151(H) 179(H) 126(H)  BUN 6 - 20 mg/dL 27(H) 26(H) 16  Creatinine 0.44 - 1.00 mg/dL 1.07(H) 1.22(H) 1.11(H)  Sodium 135 - 145 mmol/L 136 136 136  Potassium 3.5 - 5.1 mmol/L 4.3 4.3 3.8  Chloride 101 - 111 mmol/L 103 102 104  CO2 22 - 32 mmol/L 25 24 23   Calcium 8.9 - 10.3 mg/dL 9.1 9.5 8.8(L)  Total Protein 6.5 - 8.1 g/dL 7.2 7.5 7.3  Total Bilirubin  0.3 - 1.2 mg/dL 0.3 0.7 0.7  Alkaline Phos 38 - 126 U/L 34(L) 31(L) 63  AST 15 - 41 U/L 28 30 26   ALT 14 - 54 U/L 31 39 41    Imaging: Xr Knee 3 View Left  Result Date: 03/04/2017 Moderate medial compartment narrowing with intercondylar osteophytes was noted.  No chondrocalcinosis was noted.  Severe patellofemoral narrowing was noted. 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  Xr Knee 3 View Right  Result Date: 03/04/2017 Moderate medial compartment narrowing with intercondylar osteophytes was noted.  No chondrocalcinosis was noted.  Severe patellofemoral narrowing was noted. 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   Speciality Comments: No specialty comments available.    Procedures:  No procedures performed Allergies: Contrast media [iodinated diagnostic agents]; Morphine and related; Codeine; and Sulfa antibiotics   Assessment / Plan:     Visit Diagnoses: Primary osteoarthritis of both knees: She has some discomfort in her bilateral knee joints.  She had  good response to Visco supplement injections in the past.  I will obtain x-ray of her bilateral knee joints 2 views today.  X-rays reveal moderate osteoarthritis of bilateral knee joints and moderate to severe chondromalacia patella of bilateral knee joints.  Osteoarthritis is more severe in the right knee joint.  I would also schedule for repeat Visco supplement injections.  Weight loss diet and exercise was discussed.  Trochanteric bursitis of both hips: Chronic pain and discomfort.  She is requesting a permanent handicap placard due to ongoing pain in her knee joints and trochanteric area.  Which was filled and given to the patient today.  Primary osteoarthritis of both hands: Chronic pain and discomfort  DDD (degenerative disc disease), lumbar: Chronic pain  H/O Paget's disease of bone: Stable per patient  Other medical problems are listed as follows:  History of diabetes mellitus  History of hypothyroidism  History of hypertension  History of gastroesophageal reflux (GERD)  Personal history of breast cancer  History of coronary artery disease  History of hyperlipidemia  Chronic pain of both knees - Plan: XR KNEE 3 VIEW RIGHT, XR KNEE 3 VIEW LEFT    Orders: Orders Placed This Encounter  Procedures  . XR KNEE 3 VIEW RIGHT  . XR KNEE 3 VIEW LEFT   No orders of the defined types were placed in this encounter.   Face-to-face time spent with patient was 30 minutes. Greater than 50% of time was spent in counseling and coordination of care.  Follow-Up Instructions: Return in about 6 months (around 09/01/2017) for Osteoarthritis,DDD.   Bo Merino, MD  Note - This record has been created using Editor, commissioning.  Chart creation errors have been sought, but may not always  have been located. Such creation errors do not reflect on  the standard of medical care.

## 2017-03-04 ENCOUNTER — Inpatient Hospital Stay (HOSPITAL_BASED_OUTPATIENT_CLINIC_OR_DEPARTMENT_OTHER): Payer: Medicare Other | Admitting: Hematology and Oncology

## 2017-03-04 ENCOUNTER — Inpatient Hospital Stay: Payer: Medicare Other | Attending: Hematology and Oncology

## 2017-03-04 ENCOUNTER — Other Ambulatory Visit: Payer: No Typology Code available for payment source

## 2017-03-04 ENCOUNTER — Encounter: Payer: Self-pay | Admitting: Rheumatology

## 2017-03-04 ENCOUNTER — Encounter: Payer: Self-pay | Admitting: Hematology and Oncology

## 2017-03-04 ENCOUNTER — Ambulatory Visit (INDEPENDENT_AMBULATORY_CARE_PROVIDER_SITE_OTHER): Payer: Medicare Other

## 2017-03-04 ENCOUNTER — Ambulatory Visit (INDEPENDENT_AMBULATORY_CARE_PROVIDER_SITE_OTHER): Payer: Medicare Other | Admitting: Rheumatology

## 2017-03-04 ENCOUNTER — Ambulatory Visit: Payer: No Typology Code available for payment source | Admitting: Hematology and Oncology

## 2017-03-04 VITALS — BP 159/81 | HR 78 | Resp 12 | Wt 200.0 lb

## 2017-03-04 VITALS — BP 158/85 | HR 67 | Temp 97.8°F | Wt 198.2 lb

## 2017-03-04 DIAGNOSIS — M25562 Pain in left knee: Secondary | ICD-10-CM | POA: Diagnosis not present

## 2017-03-04 DIAGNOSIS — N644 Mastodynia: Secondary | ICD-10-CM | POA: Diagnosis not present

## 2017-03-04 DIAGNOSIS — M7062 Trochanteric bursitis, left hip: Secondary | ICD-10-CM | POA: Diagnosis not present

## 2017-03-04 DIAGNOSIS — Z853 Personal history of malignant neoplasm of breast: Secondary | ICD-10-CM

## 2017-03-04 DIAGNOSIS — I10 Essential (primary) hypertension: Secondary | ICD-10-CM | POA: Diagnosis not present

## 2017-03-04 DIAGNOSIS — Z170 Estrogen receptor positive status [ER+]: Secondary | ICD-10-CM | POA: Diagnosis not present

## 2017-03-04 DIAGNOSIS — M19041 Primary osteoarthritis, right hand: Secondary | ICD-10-CM

## 2017-03-04 DIAGNOSIS — G8929 Other chronic pain: Secondary | ICD-10-CM

## 2017-03-04 DIAGNOSIS — Z8639 Personal history of other endocrine, nutritional and metabolic disease: Secondary | ICD-10-CM | POA: Diagnosis not present

## 2017-03-04 DIAGNOSIS — M25561 Pain in right knee: Secondary | ICD-10-CM

## 2017-03-04 DIAGNOSIS — M19042 Primary osteoarthritis, left hand: Secondary | ICD-10-CM | POA: Diagnosis not present

## 2017-03-04 DIAGNOSIS — Z923 Personal history of irradiation: Secondary | ICD-10-CM | POA: Insufficient documentation

## 2017-03-04 DIAGNOSIS — Z8739 Personal history of other diseases of the musculoskeletal system and connective tissue: Secondary | ICD-10-CM

## 2017-03-04 DIAGNOSIS — M7061 Trochanteric bursitis, right hip: Secondary | ICD-10-CM | POA: Diagnosis not present

## 2017-03-04 DIAGNOSIS — Z79899 Other long term (current) drug therapy: Secondary | ICD-10-CM | POA: Insufficient documentation

## 2017-03-04 DIAGNOSIS — Z7982 Long term (current) use of aspirin: Secondary | ICD-10-CM | POA: Insufficient documentation

## 2017-03-04 DIAGNOSIS — K449 Diaphragmatic hernia without obstruction or gangrene: Secondary | ICD-10-CM | POA: Insufficient documentation

## 2017-03-04 DIAGNOSIS — Z7984 Long term (current) use of oral hypoglycemic drugs: Secondary | ICD-10-CM | POA: Diagnosis not present

## 2017-03-04 DIAGNOSIS — Z9011 Acquired absence of right breast and nipple: Secondary | ICD-10-CM | POA: Diagnosis not present

## 2017-03-04 DIAGNOSIS — M170 Bilateral primary osteoarthritis of knee: Secondary | ICD-10-CM | POA: Diagnosis not present

## 2017-03-04 DIAGNOSIS — K219 Gastro-esophageal reflux disease without esophagitis: Secondary | ICD-10-CM | POA: Insufficient documentation

## 2017-03-04 DIAGNOSIS — E119 Type 2 diabetes mellitus without complications: Secondary | ICD-10-CM | POA: Insufficient documentation

## 2017-03-04 DIAGNOSIS — M8580 Other specified disorders of bone density and structure, unspecified site: Secondary | ICD-10-CM

## 2017-03-04 DIAGNOSIS — Z803 Family history of malignant neoplasm of breast: Secondary | ICD-10-CM | POA: Insufficient documentation

## 2017-03-04 DIAGNOSIS — D0591 Unspecified type of carcinoma in situ of right breast: Secondary | ICD-10-CM

## 2017-03-04 DIAGNOSIS — M5136 Other intervertebral disc degeneration, lumbar region: Secondary | ICD-10-CM

## 2017-03-04 DIAGNOSIS — E039 Hypothyroidism, unspecified: Secondary | ICD-10-CM | POA: Insufficient documentation

## 2017-03-04 DIAGNOSIS — Z8719 Personal history of other diseases of the digestive system: Secondary | ICD-10-CM | POA: Diagnosis not present

## 2017-03-04 DIAGNOSIS — F419 Anxiety disorder, unspecified: Secondary | ICD-10-CM | POA: Diagnosis not present

## 2017-03-04 DIAGNOSIS — Z8679 Personal history of other diseases of the circulatory system: Secondary | ICD-10-CM | POA: Diagnosis not present

## 2017-03-04 DIAGNOSIS — M85851 Other specified disorders of bone density and structure, right thigh: Secondary | ICD-10-CM | POA: Insufficient documentation

## 2017-03-04 LAB — CBC WITH DIFFERENTIAL/PLATELET
Basophils Absolute: 0.1 10*3/uL (ref 0–0.1)
Basophils Relative: 1 %
Eosinophils Absolute: 0.5 10*3/uL (ref 0–0.7)
Eosinophils Relative: 7 %
HCT: 38 % (ref 35.0–47.0)
Hemoglobin: 12.5 g/dL (ref 12.0–16.0)
Lymphocytes Relative: 28 %
Lymphs Abs: 2 10*3/uL (ref 1.0–3.6)
MCH: 27.5 pg (ref 26.0–34.0)
MCHC: 32.9 g/dL (ref 32.0–36.0)
MCV: 83.6 fL (ref 80.0–100.0)
Monocytes Absolute: 0.4 10*3/uL (ref 0.2–0.9)
Monocytes Relative: 7 %
Neutro Abs: 4 10*3/uL (ref 1.4–6.5)
Neutrophils Relative %: 57 %
Platelets: 242 10*3/uL (ref 150–440)
RBC: 4.54 MIL/uL (ref 3.80–5.20)
RDW: 15.2 % — ABNORMAL HIGH (ref 11.5–14.5)
WBC: 6.9 10*3/uL (ref 3.6–11.0)

## 2017-03-04 LAB — COMPREHENSIVE METABOLIC PANEL
ALT: 31 U/L (ref 14–54)
AST: 28 U/L (ref 15–41)
Albumin: 4 g/dL (ref 3.5–5.0)
Alkaline Phosphatase: 34 U/L — ABNORMAL LOW (ref 38–126)
Anion gap: 8 (ref 5–15)
BUN: 27 mg/dL — ABNORMAL HIGH (ref 6–20)
CO2: 25 mmol/L (ref 22–32)
Calcium: 9.1 mg/dL (ref 8.9–10.3)
Chloride: 103 mmol/L (ref 101–111)
Creatinine, Ser: 1.07 mg/dL — ABNORMAL HIGH (ref 0.44–1.00)
GFR calc Af Amer: >60 mL/min (ref >60)
GFR calc non Af Amer: 52 mL/min — ABNORMAL LOW (ref >60)
Glucose, Bld: 151 mg/dL — ABNORMAL HIGH (ref 65–99)
Potassium: 4.3 mmol/L (ref 3.5–5.1)
Sodium: 136 mmol/L (ref 135–145)
Total Bilirubin: 0.3 mg/dL (ref 0.3–1.2)
Total Protein: 7.2 g/dL (ref 6.5–8.1)

## 2017-03-04 NOTE — Progress Notes (Signed)
Patient states she is not sleeping well.  Otherwise, no complaints. Patient states she is having night sweats.  Doesn't have problems during the day.

## 2017-03-04 NOTE — Progress Notes (Signed)
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  HPI:  The patient was last seen in the medical oncology clinic on 08/31/2016.  At that time, she was fatigued secondary to family related issues.  Her husband was going through lung cancer treatment.  She had lost her daughter.  Exam was stable. Labs were normal.   Bone density on 10/09/2016 revealed osteopenia with a T-score of -1.4 in the right femoral neck.  During the interim, patient is doing "ok". She denies any acute complaints. She verbalizes no breast concerns. Patient denies B symptoms and interval infections.   Patient is eating well. She has lost 2 pounds. Patient denies pain in the clinic today.    Past Medical History:  Diagnosis Date  . Anxiety    panic attacks; no curren med.  . Arthritis    knees  . Breast cancer ([place]) 2014   RT LUMPECTOMY  . Diabetes mellitus    NIDDM  . GERD (gastroesophageal reflux disease)   . H/O hiatal hernia   . Heart murmur    states never had any problems  . Hypertension    under control, has been on med. > 10 yrs.  . Hypothyroidism   . Medial meniscus tear 04/2011   right knee  . Nocturia    3-4 x/night  . Paget's bone disease    hips  . Personal history of radiation therapy 2014   BREAST CA  . PONV (postoperative nausea and vomiting)     Past Surgical History:  Procedure Laterality Date  . ABDOMINAL HYSTERECTOMY  1978   partial  . BACK SURGERY  04/1990, 1996  . BREAST BIOPSY Right 2014   POS  . BREAST CYST ASPIRATION Left   . BREAST LUMPECTOMY Right   . [place] PROCEDURE  09/1989   with rectal repair  . CHOLECYSTECTOMY  11/2005  . FOOT SURGERY  1992   left  . HAND SURGERY  10/1999   nerve repair right thumb  . KNEE SURGERY  07/31/2009   right  . KNEE SURGERY  2009   left  . THYROID LOBECTOMY  03/1992   left    Family History:  Sister  had breast cancer at age 65.  A first cousin had breast cancer.  An aunt had rectal cancer.  Social History:  reports that  has never smoked. she has never used smokeless tobacco. She reports that she does not drink alcohol or use drugs.  She livs in [place].  Her husband is receiving treatment for lung cancer at the [facility] in [place].  He is doing well.  The patient is alone today.  Allergies:  Allergies  Allergen Reactions  . Contrast Media [Iodinated Diagnostic Agents] Hives and Cough  . Morphine And Related Other (See Comments)    JITTERY  . Codeine Other (See Comments) and Anxiety    Panic Attacks PANIC ATTACKS  . Sulfa Antibiotics Other (See Comments)    STOMACH IRRITATION    Current Medications: Current Outpatient Medications  Medication Sig Dispense Refill  . albuterol (PROAIR HFA) 108 (90 BASE) MCG/ACT inhaler Inhale 2 Inhalers into the lungs as needed.    Marland Kitchen aspirin 325 MG tablet Take 1 tablet by mouth daily.    . Calcium Carb-Cholecalciferol 600-800 MG-UNIT TABS Take by mouth.    . calcium carbonate (OS-CAL) 600 MG TABS Take  600 mg by mouth 2 (two) times daily with a meal.    . cholecalciferol (VITAMIN D) 1000 UNITS tablet Take 1,000 Units by mouth daily.    . clopidogrel (PLAVIX) 75 MG tablet TAKE 1 TABLET ONE TIME DAILY    . Cyanocobalamin (RA VITAMIN B-12 TR) 1000 MCG TBCR Take by mouth.    . fenofibrate 160 MG tablet     . ferrous sulfate 325 (65 FE) MG tablet Take by mouth.    Marland Kitchen glimepiride (AMARYL) 2 MG tablet TAKE 1 TABLET TWICE DAILY    . Lancet Devices (RELION LANCING DEVICE) MISC Use as directed.     Marland Kitchen levothyroxine (SYNTHROID, LEVOTHROID) 50 MCG tablet Take by mouth.    Marland Kitchen lisinopril-hydrochlorothiazide (PRINZIDE,ZESTORETIC) 10-12.5 MG tablet Take by mouth.    . metFORMIN (GLUCOPHAGE) 500 MG tablet Take 1,000 mg by mouth 2 (two) times daily.     . metoprolol tartrate (LOPRESSOR) 25 MG tablet TAKE 1/2 TABLET  TWICE A DAY    . nitroGLYCERIN (NITROSTAT) 0.4  MG SL tablet Place under the tongue.    . pantoprazole (PROTONIX) 40 MG tablet TAKE 1 TABLET ONE TIME DAILY    . pravastatin (PRAVACHOL) 20 MG tablet TAKE 1 TABLET EVERY NIGHT     No current facility-administered medications for this visit.     Review of Systems:  GENERAL:  Feels "ok".  Tires out easily.  No fevers or sweats.  Weight down 2 pounds PERFORMANCE STATUS (ECOG):  0 HEENT:  No visual changes, runny nose, sore throat, mouth sores or tenderness. Lungs: No shortness of breath or cough.  No hemoptysis. Cardiac:  No chest pain, palpitations, orthopnea, or PND.  Stress test scheduled. Breast:  No breast concerns.  No masses. GI:  No nausea, vomiting, diarrhea, constipation, melena or hematochezia. GU:  No urgency, frequency, dysuria, or hematuria.  Nocturia. Musculoskeletal:  Paget's disease.  No back pain.  No muscle tenderness. Extremities:  No pain or swelling. Skin:  No rashes or skin changes. Neuro:  No headache, numbness or weakness, balance or coordination issues. Endocrine: Diabetes.  Thyroid disease on Synthroid.  No hot flashes or night sweats. Psych:  Stress.  No mood changes, depression or anxiety.  Poor sleep as wakes up to void. Pain:  No focal pain. Review of systems:  All other systems reviewed and found to be negative.  Physical Exam: Blood pressure (!) 158/85, pulse 67, temperature 97.8 F (36.6 C), temperature source Tympanic, weight 198 lb 3 oz (89.9 kg). GENERAL:  Well developed, well nourished, woman sitting comfortably in the exam room in no acute distress. MENTAL STATUS:  Alert and oriented to person, place and time. HEAD:  Ow[place]ark styled hair.  Normocephalic, atraumatic, face symmetric, no Cushingoid features. EYES:  Glasses.  Blue eyes.  Pupils equal round and reactive to light and accomodation.  No conjunctivitis or scleral icterus. ENT:  Oropharynx clear without lesion.  Tongue normal. Mucous membranes moist.  RESPIRATORY:  Clear to auscultation without  rales, wheezes or rhonchi. CARDIOVASCULAR:  Regular rate and rhythm without murmur, rub or gallop. BREAST:  Right breast without masses, skin changes or nipple discharge.  Tender lower quadrants secondary to mild post radiation edema.  Left breast without masses, skin changes or nipple discharge.  ABDOMEN:  Soft, non-tender, with active bowel sounds, and no appreciable hepatosplenomegaly.  No masses. SKIN:  No rashes, ulcers or lesions. EXTREMITIES: No edema, no skin discoloration or tenderness.  No palpable cords. LYMPH NODES: No palpable cervical, supraclavicular, axillary  or inguinal adenopathy  NEUROLOGICAL: Unremarkable. PSYCH:  Appropriate.   Appointment on 03/04/2017  Component Date Value Ref Range Status  . WBC 03/04/2017 6.9  3.6 - 11.0 K/uL Final  . RBC 03/04/2017 4.54  3.80 - 5.20 MIL/uL Final  . Hemoglobin 03/04/2017 12.5  12.0 - 16.0 g/dL Final  . HCT 03/04/2017 38.0  35.0 - 47.0 % Final  . MCV 03/04/2017 83.6  80.0 - 100.0 fL Final  . MCH 03/04/2017 27.5  26.0 - 34.0 pg Final  . MCHC 03/04/2017 32.9  32.0 - 36.0 g/dL Final  . RDW 03/04/2017 15.2* 11.5 - 14.5 % Final  . Platelets 03/04/2017 242  150 - 440 K/uL Final  . Neutrophils Relative % 03/04/2017 57  % Final  . Neutro Abs 03/04/2017 4.0  1.4 - 6.5 K/uL Final  . Lymphocytes Relative 03/04/2017 28  % Final  . Lymphs Abs 03/04/2017 2.0  1.0 - 3.6 K/uL Final  . Monocytes Relative 03/04/2017 7  % Final  . Monocytes Absolute 03/04/2017 0.4  0.2 - 0.9 K/uL Final  . Eosinophils Relative 03/04/2017 7  % Final  . Eosinophils Absolute 03/04/2017 0.5  0 - 0.7 K/uL Final  . Basophils Relative 03/04/2017 1  % Final  . Basophils Absolute 03/04/2017 0.1  0 - 0.1 K/uL Final   Performed at [facility], [address], [place], [place] [address]  . Sodium 03/04/2017 136  135 - 145 mmol/L Final  . Potassium 03/04/2017 4.3  3.5 - 5.1 mmol/L Final  . Chloride 03/04/2017 103  101 - 111 mmol/L Final  . CO2 03/04/2017 25  22 -  32 mmol/L Final  . Glucose, Bld 03/04/2017 151* 65 - 99 mg/dL Final  . BUN 03/04/2017 27* 6 - 20 mg/dL Final  . Creatinine, Ser 03/04/2017 1.07* 0.44 - 1.00 mg/dL Final  . Calcium 03/04/2017 9.1  8.9 - 10.3 mg/dL Final  . Total Protein 03/04/2017 7.2  6.5 - 8.1 g/dL Final  . Albumin 03/04/2017 4.0  3.5 - 5.0 g/dL Final  . AST 03/04/2017 28  15 - 41 U/L Final  . ALT 03/04/2017 31  14 - 54 U/L Final  . Alkaline Phosphatase 03/04/2017 34* 38 - 126 U/L Final  . Total Bilirubin 03/04/2017 0.3  0.3 - 1.2 mg/dL Final  . GFR calc non Af Amer 03/04/2017 52* >60 mL/min Final  . GFR calc Af Amer 03/04/2017 >60  >60 mL/min Final   Comment: (NOTE) The eGFR has been calculated using the CKD EPI equation. This calculation has not been validated in all clinical situations. eGFR's persistently <60 mL/min signify possible Chronic Kidney Disease.   Georgiann Hahn gap 03/04/2017 8  5 - 15 Final   Performed at [facility], [address] [place], [place] [address]    Assessment:  Brittney Ray is a 67 y.o. female with a history of right breast DCIS.  She underwent right partial mastectomy for 2 separate foci of DCIS on 03/14/2012.  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  She completed radiation in 11/2012. She was on tamoxifen for one year , then switched to Femara secondary to severe hot flashes.  She stopped taking Femara in 05/2013.  She declines further hormonal therapy.  Bilateral mammogram on 05/07/2015 revealed a a few developing coarse calcifications within the scar in the right breast.  Bilateral mammogram on 05/07/2016 revealed stable probably benign dystrophic calcifications at the right breast lumpectomy site.  The right breast lumpectomy site was .  There was no mammographic evidence of malignancy in the bilateral breasts.   Bone density study on 10/01/2014 revealed mild osteopenia with a T score of -1.1 in the right femur. Bone density on  10/09/2016 revealed osteopenia with a T-score of -1.4 in the right femoral neck.  She has a family history of breast cancer (sister age 43 and 1st cousin).  She has not had genetic testing secondary to cost issues.  Symptomatically, she feels "ok".  Her husband is going through lung cancer treatment, but he is doing "better" overall.  Exam reveals tenderness and post radiation changes in her RIGHT breast. Labs today are normal.   Plan: 1.  Labs today:  CBC with diff, CMP. 2.  Discuss interval bone density- osteopenia. 3.  Mammogram on 05/07/2017. 4.  Continue calcium and vitamin D. 5.  Discuss transferring care back to Dr. Netty Starring. Patient wants PCP to follow. 6.  RTC PRN.    Honor Loh, NP 03/04/2017, 10:38 AM   I saw and evaluated the patient, participating in the key portions of the service and reviewing pertinent diagnostic studies and records.  I reviewed the nurse practitioner's note and agree with the findings and the plan.  The assessment and plan were discussed with the patient.  A few questions were asked by the patient and answered.   Nolon Stalls, MD 03/04/2017,10:38 AM

## 2017-03-12 ENCOUNTER — Ambulatory Visit: Payer: Medicare Other | Admitting: Physician Assistant

## 2017-03-12 NOTE — Progress Notes (Deleted)
Office Visit Note  Patient: Brittney Ray             Date of Birth: 1950-06-30           MRN: [id number]             PCP: Dion Body, MD Referring: Dion Body, MD Visit Date: 03/26/2017 Occupation: @GUAROCC @    Subjective:  No chief complaint on file.   History of Present Illness: Brittney Ray is a 67 y.o. female ***   Activities of Daily Living:  Patient reports morning stiffness for *** {minute/hour:19697}.   Patient {ACTIONS;DENIES/REPORTS:21021675::"Denies"} nocturnal pain.  Difficulty dressing/grooming: {ACTIONS;DENIES/REPORTS:21021675::"Denies"} Difficulty climbing stairs: {ACTIONS;DENIES/REPORTS:21021675::"Denies"} Difficulty getting out of chair: {ACTIONS;DENIES/REPORTS:21021675::"Denies"} Difficulty using hands for taps, buttons, cutlery, and/or writing: {ACTIONS;DENIES/REPORTS:21021675::"Denies"}   No Rheumatology ROS completed.   PMFS History:  Patient Active Problem List   Diagnosis Date Noted  . Osteopenia of neck of right femur 03/04/2017  . Primary osteoarthritis of both knees 01/10/2016  . Chronic pain of both knees 01/10/2016  . Obesity (BMI 30-39.9) 03/25/2015  . Multinodular goiter 03/04/2015  . Vaccine counseling 10/24/2014  . Postprocedural hypothyroidism 10/08/2014  . Type 2 diabetes mellitus without complication, without long-term current use of insulin ([place]) 10/08/2014  . Breast cancer in situ 08/30/2014  . Coronary artery disease 08/17/2013  . NSTEMI (non-ST elevated myocardial infarction) ([place]) 08/17/2013  . Acquired hypothyroidism 02/13/2013  . Diabetes mellitus type 2, uncomplicated ([place]) 84/13/2440  . Essential hypertension 02/13/2013  . Mixed hyperlipidemia 02/13/2013  . Personal history of breast cancer 02/13/2013    Past Medical History:  Diagnosis Date  . Anxiety    panic attacks; no curren med.  . Arthritis    knees  . Breast cancer ([place]) 2014   RT LUMPECTOMY  . Diabetes mellitus    NIDDM  .  GERD (gastroesophageal reflux disease)   . H/O hiatal hernia   . Heart murmur    states never had any problems  . Hypertension    under control, has been on med. > 10 yrs.  . Hypothyroidism   . Medial meniscus tear 04/2011   right knee  . Nocturia    3-4 x/night  . Paget's bone disease    hips  . Personal history of radiation therapy 2014   BREAST CA  . PONV (postoperative nausea and vomiting)     Family History  Problem Relation Age of Onset  . Breast cancer Sister 54  . Breast cancer Cousin    Past Surgical History:  Procedure Laterality Date  . ABDOMINAL HYSTERECTOMY  1978   partial  . BACK SURGERY  04/1990, 1996  . BREAST BIOPSY Right 2014   POS  . BREAST CYST ASPIRATION Left   . BREAST LUMPECTOMY Right   . [place] PROCEDURE  09/1989   with rectal repair  . CHOLECYSTECTOMY  11/2005  . FOOT SURGERY  1992   left  . HAND SURGERY  10/1999   nerve repair right thumb  . KNEE SURGERY  07/31/2009   right  . KNEE SURGERY  2009   left  . THYROID LOBECTOMY  03/1992   left   Social History   Social History Narrative  . Not on file     Objective: Vital Signs: There were no vitals taken for this visit.   Physical Exam   Musculoskeletal Exam: ***  CDAI Exam: No CDAI exam completed.    Investigation: No additional findings. CBC Latest Ref Rng & Units 03/04/2017 08/31/2016 03/02/2016  WBC 3.6 - 11.0 K/uL 6.9 6.1 8.2  Hemoglobin 12.0 - 16.0 g/dL 12.5 12.7 13.3  Hematocrit 35.0 - 47.0 % 38.0 37.4 39.2  Platelets 150 - 440 K/uL 242 259 238   CMP Latest Ref Rng & Units 03/04/2017 08/31/2016 02/28/2015  Glucose 65 - 99 mg/dL 151(H) 179(H) 126(H)  BUN 6 - 20 mg/dL 27(H) 26(H) 16  Creatinine 0.44 - 1.00 mg/dL 1.07(H) 1.22(H) 1.11(H)  Sodium 135 - 145 mmol/L 136 136 136  Potassium 3.5 - 5.1 mmol/L 4.3 4.3 3.8  Chloride 101 - 111 mmol/L 103 102 104  CO2 22 - 32 mmol/L 25 24 23   Calcium 8.9 - 10.3 mg/dL 9.1 9.5 8.8(L)  Total Protein 6.5 - 8.1 g/dL 7.2 7.5 7.3  Total  Bilirubin 0.3 - 1.2 mg/dL 0.3 0.7 0.7  Alkaline Phos 38 - 126 U/L 34(L) 31(L) 63  AST 15 - 41 U/L 28 30 26   ALT 14 - 54 U/L 31 39 41    Imaging: Xr Knee 3 View Left  Result Date: 03/04/2017 Moderate medial compartment narrowing with intercondylar osteophytes was noted.  No chondrocalcinosis was noted.  Severe patellofemoral narrowing was noted. 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  Xr Knee 3 View Right  Result Date: 03/04/2017 Moderate medial compartment narrowing with intercondylar osteophytes was noted.  No chondrocalcinosis was noted.  Severe patellofemoral narrowing was noted. 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   Speciality Comments: No specialty comments available.    Procedures:  No procedures performed Allergies: Contrast media [iodinated diagnostic agents]; Morphine and related; Codeine; and Sulfa antibiotics   Assessment / Plan:     Visit Diagnoses: Primary osteoarthritis of both knees - Severe chondromalacia patella  Trochanteric bursitis of both hips  Primary osteoarthritis of both hands  DDD (degenerative disc disease), lumbar  H/O Paget's disease of bone  History of diabetes mellitus  History of hypothyroidism  History of hypertension  History of gastroesophageal reflux (GERD)  History of breast cancer  History of coronary artery disease  History of hyperlipidemia    Orders: No orders of the defined types were placed in this encounter.  No orders of the defined types were placed in this encounter.   Face-to-face time spent with patient was *** minutes. 50% of time was spent in counseling and coordination of care.  Follow-Up Instructions: No Follow-up on file.   Ofilia Neas, PA-C  Note - This record has been created using Dragon software.  Chart creation errors have been sought, but may not always  have been located. Such creation  errors do not reflect on  the standard of medical care.

## 2017-03-26 ENCOUNTER — Ambulatory Visit (INDEPENDENT_AMBULATORY_CARE_PROVIDER_SITE_OTHER): Payer: Medicare Other | Admitting: Physician Assistant

## 2017-03-26 DIAGNOSIS — M7062 Trochanteric bursitis, left hip: Secondary | ICD-10-CM

## 2017-03-26 DIAGNOSIS — Z8719 Personal history of other diseases of the digestive system: Secondary | ICD-10-CM

## 2017-03-26 DIAGNOSIS — Z8679 Personal history of other diseases of the circulatory system: Secondary | ICD-10-CM

## 2017-03-26 DIAGNOSIS — M19042 Primary osteoarthritis, left hand: Secondary | ICD-10-CM

## 2017-03-26 DIAGNOSIS — M170 Bilateral primary osteoarthritis of knee: Secondary | ICD-10-CM | POA: Diagnosis not present

## 2017-03-26 DIAGNOSIS — Z8639 Personal history of other endocrine, nutritional and metabolic disease: Secondary | ICD-10-CM

## 2017-03-26 DIAGNOSIS — M5136 Other intervertebral disc degeneration, lumbar region: Secondary | ICD-10-CM

## 2017-03-26 DIAGNOSIS — Z8739 Personal history of other diseases of the musculoskeletal system and connective tissue: Secondary | ICD-10-CM

## 2017-03-26 DIAGNOSIS — M7061 Trochanteric bursitis, right hip: Secondary | ICD-10-CM

## 2017-03-26 DIAGNOSIS — Z853 Personal history of malignant neoplasm of breast: Secondary | ICD-10-CM

## 2017-03-26 DIAGNOSIS — M19041 Primary osteoarthritis, right hand: Secondary | ICD-10-CM

## 2017-03-26 MED ORDER — LIDOCAINE HCL 1 % IJ SOLN
1.5000 mL | INTRAMUSCULAR | Status: AC | PRN
  Administered 2017-03-26: 1.5 mL

## 2017-03-26 MED ORDER — SODIUM HYALURONATE (VISCOSUP) 20 MG/2ML IX SOSY
20.0000 mg | PREFILLED_SYRINGE | INTRA_ARTICULAR | Status: AC | PRN
  Administered 2017-03-26: 20 mg via INTRA_ARTICULAR

## 2017-03-26 NOTE — Progress Notes (Signed)
   Procedure Note  Patient: Brittney Ray             Date of Birth: [birth year]           MRN: [id number]             Visit Date: 03/26/2017  Procedures: Visit Diagnoses: Primary osteoarthritis of both knees - Severe chondromalacia patella   Large Joint Inj: bilateral knee on 03/26/2017 11:16 AM Indications: pain Details: 27 G 1.5 in needle, medial approach  Arthrogram: No  Medications (Right): 1.5 mL lidocaine 1 %; 20 mg Sodium Hyaluronate 20 MG/2ML Aspirate (Right): 0 mL Medications (Left): 1.5 mL lidocaine 1 %; 20 mg Sodium Hyaluronate 20 MG/2ML Aspirate (Left): 0 mL Outcome: tolerated well, no immediate complications Procedure, treatment alternatives, risks and benefits explained, specific risks discussed. Consent was given by the patient. Immediately prior to procedure a time out was called to verify the correct patient, procedure, equipment, support staff and site/side marked as required. Patient was prepped and draped in the usual sterile fashion.      Patient tolerated the procedure well.    Hazel Sams, PA-C

## 2017-04-02 ENCOUNTER — Ambulatory Visit (INDEPENDENT_AMBULATORY_CARE_PROVIDER_SITE_OTHER): Payer: Medicare Other | Admitting: Physician Assistant

## 2017-04-02 DIAGNOSIS — M170 Bilateral primary osteoarthritis of knee: Secondary | ICD-10-CM | POA: Diagnosis not present

## 2017-04-02 MED ORDER — SODIUM HYALURONATE (VISCOSUP) 20 MG/2ML IX SOSY
20.0000 mg | PREFILLED_SYRINGE | INTRA_ARTICULAR | Status: AC | PRN
Start: 2017-04-02 — End: 2017-04-02
  Administered 2017-04-02: 20 mg via INTRA_ARTICULAR

## 2017-04-02 MED ORDER — LIDOCAINE HCL 1 % IJ SOLN
1.5000 mL | INTRAMUSCULAR | Status: AC | PRN
  Administered 2017-04-02: 1.5 mL

## 2017-04-02 MED ORDER — SODIUM HYALURONATE (VISCOSUP) 20 MG/2ML IX SOSY
20.0000 mg | PREFILLED_SYRINGE | INTRA_ARTICULAR | Status: AC | PRN
  Administered 2017-04-02: 20 mg via INTRA_ARTICULAR

## 2017-04-02 MED ORDER — LIDOCAINE HCL 1 % IJ SOLN
1.5000 mL | INTRAMUSCULAR | Status: AC | PRN
Start: 2017-04-02 — End: 2017-04-02
  Administered 2017-04-02: 1.5 mL

## 2017-04-02 NOTE — Progress Notes (Signed)
   Procedure Note  Patient: WREATHA STURGEON             Date of Birth: 1950-01-28           MRN: [id number]             Visit Date: 04/02/2017  Procedures: Visit Diagnoses: Primary osteoarthritis of both knees Hyalgan #2 bilateral B/B Large Joint Inj: bilateral knee on 04/02/2017 10:38 AM Indications: pain Details: 27 G 1.5 in needle, medial approach  Arthrogram: No  Medications (Right): 20 mg Sodium Hyaluronate 20 MG/2ML; 1.5 mL lidocaine 1 % Aspirate (Right): 0 mL Medications (Left): 20 mg Sodium Hyaluronate 20 MG/2ML; 1.5 mL lidocaine 1 % Aspirate (Left): 0 mL Outcome: tolerated well, no immediate complications Procedure, treatment alternatives, risks and benefits explained, specific risks discussed. Consent was given by the patient. Immediately prior to procedure a time out was called to verify the correct patient, procedure, equipment, support staff and site/side marked as required. Patient was prepped and draped in the usual sterile fashion.     Hazel Sams, PA-C

## 2017-04-09 ENCOUNTER — Ambulatory Visit (INDEPENDENT_AMBULATORY_CARE_PROVIDER_SITE_OTHER): Payer: Medicare Other | Admitting: Physician Assistant

## 2017-04-09 DIAGNOSIS — M170 Bilateral primary osteoarthritis of knee: Secondary | ICD-10-CM | POA: Diagnosis not present

## 2017-04-09 MED ORDER — LIDOCAINE HCL 1 % IJ SOLN
1.5000 mL | INTRAMUSCULAR | Status: AC | PRN
  Administered 2017-04-09: 1.5 mL

## 2017-04-09 MED ORDER — SODIUM HYALURONATE (VISCOSUP) 20 MG/2ML IX SOSY
20.0000 mg | PREFILLED_SYRINGE | INTRA_ARTICULAR | Status: AC | PRN
  Administered 2017-04-09: 20 mg via INTRA_ARTICULAR

## 2017-04-09 NOTE — Progress Notes (Signed)
   Procedure Note  Patient: Brittney Ray             Date of Birth: November 29, 1950           MRN: [id number]             Visit Date: 04/09/2017  Procedures: Visit Diagnoses: Primary osteoarthritis of both knees Hyalgan #3 bilateral B/B Large Joint Inj: bilateral knee on 04/09/2017 10:33 AM Indications: pain Details: 27 G 1.5 in needle, medial approach  Arthrogram: No  Medications (Right): 20 mg Sodium Hyaluronate 20 MG/2ML; 1.5 mL lidocaine 1 % Aspirate (Right): 0 mL Medications (Left): 20 mg Sodium Hyaluronate 20 MG/2ML; 1.5 mL lidocaine 1 % Aspirate (Left): 0 mL Outcome: tolerated well, no immediate complications Procedure, treatment alternatives, risks and benefits explained, specific risks discussed. Consent was given by the patient. Immediately prior to procedure a time out was called to verify the correct patient, procedure, equipment, support staff and site/side marked as required. Patient was prepped and draped in the usual sterile fashion.    Patient tolerated the procedure well.   Hazel Sams, PA-C

## 2017-04-16 ENCOUNTER — Ambulatory Visit (INDEPENDENT_AMBULATORY_CARE_PROVIDER_SITE_OTHER): Payer: Medicare Other | Admitting: Physician Assistant

## 2017-04-16 DIAGNOSIS — M170 Bilateral primary osteoarthritis of knee: Secondary | ICD-10-CM

## 2017-04-16 MED ORDER — LIDOCAINE HCL 1 % IJ SOLN
1.5000 mL | INTRAMUSCULAR | Status: AC | PRN
  Administered 2017-04-16: 1.5 mL

## 2017-04-16 MED ORDER — SODIUM HYALURONATE (VISCOSUP) 20 MG/2ML IX SOSY
20.0000 mg | PREFILLED_SYRINGE | INTRA_ARTICULAR | Status: AC | PRN
  Administered 2017-04-16: 20 mg via INTRA_ARTICULAR

## 2017-04-16 MED ORDER — SODIUM HYALURONATE (VISCOSUP) 20 MG/2ML IX SOSY
20.0000 mg | PREFILLED_SYRINGE | INTRA_ARTICULAR | Status: AC | PRN
Start: 2017-04-16 — End: 2017-04-16
  Administered 2017-04-16: 20 mg via INTRA_ARTICULAR

## 2017-04-16 NOTE — Progress Notes (Signed)
   Procedure Note  Patient: Brittney Ray             Date of Birth: 07-Nov-1950           MRN: [id number]             Visit Date: 04/16/2017  Procedures: Visit Diagnoses: Primary osteoarthritis of both knees Hyalgan #4 bilateral B/B Large Joint Inj: bilateral knee on 04/16/2017 10:58 AM Indications: pain Details: 27 G 1.5 in needle, medial approach  Arthrogram: No  Medications (Right): 20 mg Sodium Hyaluronate 20 MG/2ML; 1.5 mL lidocaine 1 % Aspirate (Right): 0 mL Medications (Left): 20 mg Sodium Hyaluronate 20 MG/2ML; 1.5 mL lidocaine 1 % Aspirate (Left): 0 mL Outcome: tolerated well, no immediate complications Procedure, treatment alternatives, risks and benefits explained, specific risks discussed. Consent was given by the patient. Immediately prior to procedure a time out was called to verify the correct patient, procedure, equipment, support staff and site/side marked as required. Patient was prepped and draped in the usual sterile fashion.     Patient tolerated procedure well.    Hazel Sams, PA-C

## 2017-04-23 ENCOUNTER — Ambulatory Visit (INDEPENDENT_AMBULATORY_CARE_PROVIDER_SITE_OTHER): Payer: Medicare Other | Admitting: Physician Assistant

## 2017-04-23 DIAGNOSIS — M170 Bilateral primary osteoarthritis of knee: Secondary | ICD-10-CM

## 2017-04-23 MED ORDER — LIDOCAINE HCL 1 % IJ SOLN
1.5000 mL | INTRAMUSCULAR | Status: AC | PRN
  Administered 2017-04-23: 1.5 mL

## 2017-04-23 MED ORDER — SODIUM HYALURONATE (VISCOSUP) 20 MG/2ML IX SOSY
20.0000 mg | PREFILLED_SYRINGE | INTRA_ARTICULAR | Status: AC | PRN
  Administered 2017-04-23: 20 mg via INTRA_ARTICULAR

## 2017-04-23 NOTE — Progress Notes (Signed)
   Procedure Note  Patient: Brittney Ray             Date of Birth: 1951/01/10           MRN: [id number]             Visit Date: 04/23/2017  Procedures: Visit Diagnoses: Primary osteoarthritis of both knees Hyalgan #5 bilateral B/B Large Joint Inj: bilateral knee on 04/23/2017 10:26 AM Indications: pain Details: 27 G 1.5 in needle, medial approach  Arthrogram: No  Medications (Right): 20 mg Sodium Hyaluronate 20 MG/2ML; 1.5 mL lidocaine 1 % Aspirate (Right): 0 mL Medications (Left): 20 mg Sodium Hyaluronate 20 MG/2ML; 1.5 mL lidocaine 1 % Aspirate (Left): 0 mL Outcome: tolerated well, no immediate complications Procedure, treatment alternatives, risks and benefits explained, specific risks discussed. Consent was given by the patient. Immediately prior to procedure a time out was called to verify the correct patient, procedure, equipment, support staff and site/side marked as required. Patient was prepped and draped in the usual sterile fashion.     Patient tolerated the procedure well.   Hazel Sams, PA-C

## 2017-05-10 ENCOUNTER — Ambulatory Visit
Admission: RE | Admit: 2017-05-10 | Discharge: 2017-05-10 | Disposition: A | Discharge status: Home / Self Care | Payer: Medicare Other | Source: Ambulatory Visit | Attending: Urgent Care | Admitting: Urgent Care

## 2017-05-10 DIAGNOSIS — Z853 Personal history of malignant neoplasm of breast: Secondary | ICD-10-CM | POA: Diagnosis not present

## 2017-05-10 DIAGNOSIS — Z08 Encounter for follow-up examination after completed treatment for malignant neoplasm: Secondary | ICD-10-CM | POA: Insufficient documentation

## 2017-05-10 DIAGNOSIS — D0591 Unspecified type of carcinoma in situ of right breast: Secondary | ICD-10-CM | POA: Diagnosis present

## 2017-08-27 NOTE — Progress Notes (Signed)
Office Visit Note  Patient: Brittney Ray             Date of Birth: 07-19-1950           MRN: [id number]             PCP: Dion Body, MD Referring: Dion Body, MD Visit Date: 08/30/2017 Occupation: @GUAROCC @  Subjective:  Pain in both hips  History of Present Illness: Brittney Ray is a 67 y.o. female with history of osteoarthritis and DDD.  Osteoarthritis.  She states she has been having pain and discomfort in her bilateral hips.  She has difficulty walking long distance.  The joints are doing well.  She is a stiff in the morning but her hands do not bother during the daytime.  Is not having much discomfort in the lower back.  Activities of Daily Living:  Patient reports morning stiffness for2  minutes.   Patient Denies nocturnal pain.  Difficulty dressing/grooming: Denies Difficulty climbing stairs: Reports Difficulty getting out of chair: Reports Difficulty using hands for taps, buttons, cutlery, and/or writing: Denies  Review of Systems  Constitutional: Positive for fatigue. Negative for night sweats, weight gain and weight loss.  HENT: Positive for mouth dryness. Negative for mouth sores, trouble swallowing, trouble swallowing and nose dryness.   Eyes: Negative for pain, redness, visual disturbance and dryness.  Respiratory: Negative for cough, hemoptysis, shortness of breath and difficulty breathing.   Cardiovascular: Positive for hypertension. Negative for chest pain, palpitations, irregular heartbeat and swelling in legs/feet.  Gastrointestinal: Negative for blood in stool, constipation and diarrhea.  Endocrine: Negative for increased urination.  Genitourinary: Positive for nocturia. Negative for painful urination and vaginal dryness.  Musculoskeletal: Positive for arthralgias, joint pain and morning stiffness. Negative for joint swelling, myalgias, muscle weakness, muscle tenderness and myalgias.  Skin: Negative for color change, pallor,  rash, hair loss, nodules/bumps, skin tightness, ulcers and sensitivity to sunlight.  Allergic/Immunologic: Negative for susceptible to infections.  Neurological: Negative for dizziness, numbness, headaches, memory loss, night sweats and weakness.  Hematological: Negative for swollen glands.  Psychiatric/Behavioral: Positive for sleep disturbance. Negative for depressed mood. The patient is not nervous/anxious.     PMFS History:  Patient Active Problem List   Diagnosis Date Noted  . Primary osteoarthritis of both hands 08/30/2017  . Trochanteric bursitis of both hips 08/30/2017  . DDD (degenerative disc disease), lumbar 08/30/2017  . H/O Paget's disease of bone 08/30/2017  . Osteopenia of neck of right femur 03/04/2017  . Primary osteoarthritis of both knees 01/10/2016  . Chronic pain of both knees 01/10/2016  . Obesity (BMI 30-39.9) 03/25/2015  . Multinodular goiter 03/04/2015  . Vaccine counseling 10/24/2014  . Postprocedural hypothyroidism 10/08/2014  . Type 2 diabetes mellitus without complication, without long-term current use of insulin ([place]) 10/08/2014  . Breast cancer in situ 08/30/2014  . Coronary artery disease 08/17/2013  . NSTEMI (non-ST elevated myocardial infarction) ([place]) 08/17/2013  . Acquired hypothyroidism 02/13/2013  . Diabetes mellitus type 2, uncomplicated ([place]) 89/38/1017  . Essential hypertension 02/13/2013  . Mixed hyperlipidemia 02/13/2013  . Personal history of breast cancer 02/13/2013    Past Medical History:  Diagnosis Date  . Anxiety    panic attacks; no curren med.  . Arthritis    knees  . Breast cancer ([place]) 2014   RT LUMPECTOMY  . Diabetes mellitus    NIDDM  . GERD (gastroesophageal reflux disease)   . H/O hiatal hernia   . Heart murmur  states never had any problems  . Hypertension    under control, has been on med. > 10 yrs.  . Hypothyroidism   . Medial meniscus tear 04/2011   right knee  . Nocturia    3-4 x/night  . Paget's bone  disease    hips  . Personal history of radiation therapy 2014   right BREAST CA  . PONV (postoperative nausea and vomiting)     Family History  Problem Relation Age of Onset  . Breast cancer Sister 43  . Breast cancer Cousin    Past Surgical History:  Procedure Laterality Date  . ABDOMINAL HYSTERECTOMY  1978   partial  . BACK SURGERY  04/1990, 1996  . BREAST BIOPSY Right 03/14/2012   2 areas UOQ of DCIS  . BREAST CYST ASPIRATION Left   . BREAST LUMPECTOMY Right 03/29/2012   right breast DCIS of two areas. Clear margins  . [place] PROCEDURE  09/1989   with rectal repair  . CHOLECYSTECTOMY  11/2005  . FOOT SURGERY  1992   left  . HAND SURGERY  10/1999   nerve repair right thumb  . KNEE SURGERY  07/31/2009   right  . KNEE SURGERY  2009   left  . THYROID LOBECTOMY  03/1992   left   Social History   Social History Narrative  . Not on file    Objective: Vital Signs: BP (!) 156/86 (BP Location: Left Arm, Patient Position: Sitting, Cuff Size: Normal)   Pulse 77   Resp 14   Ht 5\' 1"  (1.549 m)   Wt 192 lb 9.6 oz (87.4 kg)   BMI 36.39 kg/m    Physical Exam  Constitutional: She is oriented to person, place, and time. She appears well-developed and well-nourished.  HENT:  Head: Normocephalic and atraumatic.  Eyes: Conjunctivae and EOM are normal.  Neck: Normal range of motion.  Cardiovascular: Normal rate, regular rhythm, normal heart sounds and intact distal pulses.  Pulmonary/Chest: Effort normal and breath sounds normal.  Abdominal: Soft. Bowel sounds are normal.  Lymphadenopathy:    She has no cervical adenopathy.  Neurological: She is alert and oriented to person, place, and time.  Skin: Skin is warm and dry. Capillary refill takes less than 2 seconds.  Psychiatric: She has a normal mood and affect. Her behavior is normal.  Nursing note and vitals reviewed.    Musculoskeletal Exam: Spine thoracic lumbar spine limited range of motion without much discomfort.   Shoulder joints elbow joints wrist joints were in good range of motion.  She has DIP PIP thickening in her hands consistent with osteoarthritis.  She had limited painful range of motion of bilateral hip joints with some tenderness over trochanteric area.  She is crepitus in her knee joints without any warmth swelling of effusion.  CDAI Exam: CDAI Score: Not documented Patient Global Assessment: Not documented; Provider Global Assessment: Not documented Swollen: Not documented; Tender: Not documented Joint Exam   Not documented   There is currently no information documented on the homunculus. Go to the Rheumatology activity and complete the homunculus joint exam.  Investigation: No additional findings.  Imaging: Xr Hip Unilat W Or W/o Pelvis 2-3 Views Left  Result Date: 08/30/2017 Significant cortical thickening in the femoral head, neck and cortex was noted.  More prominent in the left femur compared to right femur.  No significant hip joint narrowing was noted.  Some spurring was noted.  Xr Hip Unilat W Or W/o Pelvis 2-3 Views Right  Result Date: 08/30/2017 No significant hip joint narrowing was noted.  Cortical thickening of the femoral neck, femoral head and the cortex and the shaft was noted. Impression: No hip joint narrowing was noted.  Cortical thickening was consistent with Paget's disease.   Recent Labs: Lab Results  Component Value Date   WBC 6.9 03/04/2017   HGB 12.5 03/04/2017   PLT 242 03/04/2017   NA 136 03/04/2017   K 4.3 03/04/2017   CL 103 03/04/2017   CO2 25 03/04/2017   GLUCOSE 151 (H) 03/04/2017   BUN 27 (H) 03/04/2017   CREATININE 1.07 (H) 03/04/2017   BILITOT 0.3 03/04/2017   ALKPHOS 34 (L) 03/04/2017   AST 28 03/04/2017   ALT 31 03/04/2017   PROT 7.2 03/04/2017   ALBUMIN 4.0 03/04/2017   CALCIUM 9.1 03/04/2017   GFRAA >60 03/04/2017    Speciality Comments: No specialty comments available.  Procedures:  No procedures performed Allergies:  Contrast media [iodinated diagnostic agents]; Morphine and related; Codeine; and Sulfa antibiotics   Assessment / Plan:     Visit Diagnoses: Chronic pain of both hips -she has been experiencing pain in her bilateral hips.  She had painful range of motion of her hip joints today.  She states she has difficulty walking long distance.  Plan: XR HIP UNILAT W OR W/O PELVIS 2-3 VIEWS RIGHT, XR HIP UNILAT W OR W/O PELVIS 2-3 VIEWS LEFT.  Significant hip joint narrowing was noted.  Some spurring around the hip joint was noted.  Handout on hip exercise was given.  Primary osteoarthritis of both knees-she continues to have some discomfort in her knee joints but is tolerable.  Primary osteoarthritis of both hands-she has DIP and PIP thickening.  Joint protection muscle strengthening was discussed.   Trochanteric bursitis of both hips-she had mild tenderness over trochanteric bursa.  Have given her a handout on IT band exercises.  DDD (degenerative disc disease), lumbar-the lower back pain is tolerable . Other medical problems are listed as follows.:  H/O Paget's disease of bone-she has significant Paget's disease involving bilateral femur bone.  More prominent in the left femur.  I will schedule MRI of the left hip joint to evaluate this further.  History of hyperlipidemia  History of diabetes mellitus  History of coronary artery disease  History of gastroesophageal reflux (GERD)  History of hypertension  History of hypothyroidism  History of breast cancer   Orders: Orders Placed This Encounter  Procedures  . XR HIP UNILAT W OR W/O PELVIS 2-3 VIEWS RIGHT  . XR HIP UNILAT W OR W/O PELVIS 2-3 VIEWS LEFT  . CT HIP LEFT W CONTRAST  . Ambulatory referral to Physical Therapy   No orders of the defined types were placed in this encounter.   Face-to-face time spent with patient was 30 minutes. Greater than 50% of time was spent in counseling and coordination of care.  Follow-Up  Instructions: Return in about 6 months (around 03/02/2018) for Osteoarthritis, DDD.   Bo Merino, MD  Note - This record has been created using Editor, commissioning.  Chart creation errors have been sought, but may not always  have been located. Such creation errors do not reflect on  the standard of medical care.

## 2017-08-30 ENCOUNTER — Encounter: Payer: Self-pay | Admitting: Rheumatology

## 2017-08-30 ENCOUNTER — Ambulatory Visit (INDEPENDENT_AMBULATORY_CARE_PROVIDER_SITE_OTHER): Payer: Self-pay

## 2017-08-30 ENCOUNTER — Encounter (INDEPENDENT_AMBULATORY_CARE_PROVIDER_SITE_OTHER): Payer: Self-pay

## 2017-08-30 ENCOUNTER — Ambulatory Visit (INDEPENDENT_AMBULATORY_CARE_PROVIDER_SITE_OTHER): Payer: Medicare Other | Admitting: Rheumatology

## 2017-08-30 ENCOUNTER — Ambulatory Visit (INDEPENDENT_AMBULATORY_CARE_PROVIDER_SITE_OTHER): Payer: Medicare Other

## 2017-08-30 VITALS — BP 156/86 | HR 77 | Resp 14 | Ht 61.0 in | Wt 192.6 lb

## 2017-08-30 DIAGNOSIS — M25551 Pain in right hip: Secondary | ICD-10-CM

## 2017-08-30 DIAGNOSIS — Z8739 Personal history of other diseases of the musculoskeletal system and connective tissue: Secondary | ICD-10-CM

## 2017-08-30 DIAGNOSIS — Z8639 Personal history of other endocrine, nutritional and metabolic disease: Secondary | ICD-10-CM

## 2017-08-30 DIAGNOSIS — M170 Bilateral primary osteoarthritis of knee: Secondary | ICD-10-CM

## 2017-08-30 DIAGNOSIS — M51369 Other intervertebral disc degeneration, lumbar region without mention of lumbar back pain or lower extremity pain: Secondary | ICD-10-CM

## 2017-08-30 DIAGNOSIS — M19041 Primary osteoarthritis, right hand: Secondary | ICD-10-CM | POA: Diagnosis not present

## 2017-08-30 DIAGNOSIS — M25552 Pain in left hip: Secondary | ICD-10-CM | POA: Diagnosis not present

## 2017-08-30 DIAGNOSIS — M7062 Trochanteric bursitis, left hip: Secondary | ICD-10-CM

## 2017-08-30 DIAGNOSIS — M7061 Trochanteric bursitis, right hip: Secondary | ICD-10-CM

## 2017-08-30 DIAGNOSIS — M5136 Other intervertebral disc degeneration, lumbar region: Secondary | ICD-10-CM

## 2017-08-30 DIAGNOSIS — G8929 Other chronic pain: Secondary | ICD-10-CM | POA: Diagnosis not present

## 2017-08-30 DIAGNOSIS — M19042 Primary osteoarthritis, left hand: Secondary | ICD-10-CM

## 2017-08-30 DIAGNOSIS — Z853 Personal history of malignant neoplasm of breast: Secondary | ICD-10-CM

## 2017-08-30 DIAGNOSIS — Z8679 Personal history of other diseases of the circulatory system: Secondary | ICD-10-CM

## 2017-08-30 DIAGNOSIS — Z8719 Personal history of other diseases of the digestive system: Secondary | ICD-10-CM

## 2017-08-30 NOTE — Patient Instructions (Addendum)
Hip Exercises Ask your health care provider which exercises are safe for you. Do exercises exactly as told by your health care provider and adjust them as directed. It is normal to feel mild stretching, pulling, tightness, or discomfort as you do these exercises, but you should stop right away if you feel sudden pain or your pain gets worse.Do not begin these exercises until told by your health care provider. STRETCHING AND RANGE OF MOTION EXERCISES These exercises warm up your muscles and joints and improve the movement and flexibility of your hip. These exercises also help to relieve pain, numbness, and tingling. Exercise A: Hamstrings, Supine  1. Lie on your back. 2. Loop a belt or towel over the ball of your left / rightfoot. The ball of your foot is on the walking surface, right under your toes. 3. Straighten your left / rightknee and slowly pull on the belt to raise your leg. Do not let your left / right knee ben Iliotibial Band Syndrome Rehab Ask your health care provider which exercises are safe for you. Do exercises exactly as told by your health care provider and adjust them as directed. It is normal to feel mild stretching, pulling, tightness, or discomfort as you do these exercises, but you should stop right away if you feel sudden pain or your pain gets worse.Do not begin these exercises until told by your health care provider. Stretching and range of motion exercises These exercises warm up your muscles and joints and improve the movement and flexibility of your hip and pelvis. Exercise A: Quadriceps, prone  1. Lie on your abdomen on a firm surface, such as a bed or padded floor. 2. Bend your left / right knee and hold your ankle. If you cannot reach your ankle or pant leg, loop a belt around your foot and grab the belt instead. 3. Gently pull your heel toward your buttocks. Your knee should not slide out to the side. You should feel a stretch in the front of your thigh and  knee. 4. Hold this position for __________ seconds. Repeat __________ times. Complete this stretch __________ times a day. Exercise B: Iliotibial band  1. Lie on your side with your left / right leg in the top position. 2. Bend both of your knees and grab your left / right ankle. Stretch out your bottom arm to help you balance. 3. Slowly bring your top knee back so your thigh goes behind your trunk. 4. Slowly lower your top leg toward the floor until you feel a gentle stretch on the outside of your left / right hip and thigh. If you do not feel a stretch and your knee will not fall farther, place the heel of your other foot on top of your knee and pull your knee down toward the floor with your foot. 5. Hold this position for __________ seconds. Repeat __________ times. Complete this stretch __________ times a day. Strengthening exercises These exercises build strength and endurance in your hip and pelvis. Endurance is the ability to use your muscles for a long time, even after they get tired. Exercise C: Straight leg raises ( hip abductors) 1. Lie on your side with your left / right leg in the top position. Lie so your head, shoulder, knee, and hip line up. You may bend your bottom knee to help you balance. 2. Roll your hips slightly forward so your hips are stacked directly over each other and your left / right knee is facing forward. 3. Tense  the muscles in your outer thigh and lift your top leg 4-6 inches (10-15 cm). 4. Hold this position for __________ seconds. 5. Slowly return to the starting position. Let your muscles relax completely before doing another repetition. Repeat __________ times. Complete this exercise __________ times a day. Exercise D: Straight leg raises ( hip extensors) 1. Lie on your abdomen on your bed or a firm surface. You can put a pillow under your hips if that is more comfortable. 2. Bend your left / right knee so your foot is straight up in the air. 3. Squeeze  your buttock muscles and lift your left / right thigh off the bed. Do not let your back arch. 4. Tense this muscle as hard as you can without increasing any knee pain. 5. Hold this position for __________ seconds. 6. Slowly lower your leg to the starting position and allow it to relax completely. Repeat __________ times. Complete this exercise __________ times a day. Exercise E: Hip hike 1. Stand sideways on a bottom step. Stand on your left / right leg with your other foot unsupported next to the step. You can hold onto the railing or wall if needed for balance. 2. Keep your knees straight and your torso square. Then, lift your left / right hip up toward the ceiling. 3. Slowly let your left / right hip lower toward the floor, past the starting position. Your foot should get closer to the floor. Do not lean or bend your knees. Repeat __________ times. Complete this exercise __________ times a day. This information is not intended to replace advice given to you by your health care provider. Make sure you discuss any questions you have with your health care provider. Document Released: 12/29/2004 Document Revised: 09/03/2015 Document Reviewed: 11/30/2014 Elsevier Interactive Patient Education  [employer]. ? d while you do this. ? Keep your other leg flat on the floor. ? Raise the left / right leg until you feel a gentle stretch behind your left / right knee or thigh. 4. Hold this position for __________ seconds. 5. Slowly return your leg to the starting position. Repeat __________ times. Complete this stretch __________ times a day. Exercise B: Hip Rotators  1. Lie on your back on a firm surface. 2. Hold your left / right knee with your left / right hand. Hold your ankle with your other hand. 3. Gently pull your left / right knee and rotate your lower leg toward your other shoulder. ? Pull until you feel a stretch in your buttocks. ? Keep your hips and shoulders firmly planted while  you do this stretch. 4. Hold this position for __________ seconds. Repeat __________ times. Complete this stretch __________ times a day. Exercise C: V-Sit (Hamstrings and Adductors)  6. Sit on the floor with your legs extended in a large "V" shape. Keep your knees straight during this exercise. 7. Start with your head and chest upright, then bend at your waist to reach for your left foot (position A). You should feel a stretch in your right inner thigh. 8. Hold this position for __________ seconds. Then slowly return to the upright position. 9. Bend at your waist to reach forward (position B). You should feel a stretch behind both of your thighs and knees. 10. Hold this position for __________ seconds. Then slowly return to the upright position. 11. Bend at your waist to reach for your right foot (position C). You should feel a stretch in your left inner thigh. 12. Hold this  position for __________ seconds. Then slowly return to the upright position. Repeat __________ times. Complete this stretch __________ times a day. Exercise D: Lunge (Hip Flexors)  7. Place your left / right knee on the floor and bend your other knee so that is directly over your ankle. You should be half-kneeling. 8. Keep good posture with your head over your shoulders. 9. Tighten your buttocks to point your tailbone downward. This helps your back to keep from arching too much. 10. You should feel a gentle stretch in the front of your left / right thigh and hip. If you do not feel any resistance, slightly slide your other foot forward and then slowly lunge forward so your knee once again lines up over your ankle. 11. Make sure your tailbone continues to point downward. 12. Hold this position for __________ seconds. Repeat __________ times. Complete this stretch __________ times a day. STRENGTHENING EXERCISES These exercises build strength and endurance in your hip. Endurance is the ability to use your muscles for a long  time, even after they get tired. Exercise E: Bridge (Hip Extensors)  1. Lie on your back on a firm surface with your knees bent and your feet flat on the floor. 2. Tighten your buttocks muscles and lift your bottom off the floor until the trunk of your body is level with your thighs. ? Do not arch your back. ? You should feel the muscles working in your buttocks and the back of your thighs. If you do not feel these muscles, slide your feet 1-2 inches (2.5-5 cm) farther away from your buttocks. 3. Hold this position for __________ seconds. 4. Slowly lower your hips to the starting position. 5. Let your muscles relax completely between repetitions. 6. If this exercise is too easy, try doing it with your arms crossed over your chest. Repeat __________ times. Complete this exercise __________ times a day. Exercise F: Straight Leg Raises - Hip Abductors  1. Lie on your side with your left / right leg in the top position. Lie so your head, shoulder, knee, and hip line up with each other. You may bend your bottom knee to help you balance. 2. Roll your hips slightly forward, so your hips are stacked directly over each other and your left / right knee is facing forward. 3. Leading with your heel, lift your top leg 4-6 inches (10-15 cm). You should feel the muscles in your outer hip lifting. ? Do not let your foot drift forward. ? Do not let your knee roll toward the ceiling. 4. Hold this position for __________ seconds. 5. Slowly return to the starting position. 6. Let your muscles relax completely between repetitions. Repeat __________ times. Complete this exercise __________ times a day. Exercise G: Straight Leg Raises - Hip Adductors  1. Lie on your side with your left / right leg in the bottom position. Lie so your head, shoulder, knee, and hip line up. You may place your upper foot in front to help you balance. 2. Roll your hips slightly forward, so your hips are stacked directly over each  other and your left / right knee is facing forward. 3. Tense the muscles in your inner thigh and lift your bottom leg 4-6 inches (10-15 cm). 4. Hold this position for __________ seconds. 5. Slowly return to the starting position. 6. Let your muscles relax completely between repetitions. Repeat __________ times. Complete this exercise __________ times a day. Exercise H: Straight Leg Raises - Quadriceps  1. Lie on your  back with your left / right leg extended and your other knee bent. 2. Tense the muscles in the front of your left / right thigh. When you do this, you should see your kneecap slide up or see increased dimpling just above your knee. 3. Tighten these muscles even more and raise your leg 4-6 inches (10-15 cm) off the floor. 4. Hold this position for __________ seconds. 5. Keep these muscles tense as you lower your leg. 6. Relax the muscles slowly and completely between repetitions. Repeat __________ times. Complete this exercise __________ times a day. Exercise I: Hip Abductors, Standing 1. Tie one end of a rubber exercise band or tubing to a secure surface, such as a table or pole. 2. Loop the other end of the band or tubing around your left / right ankle. 3. Keeping your ankle with the band or tubing directly opposite of the secured end, step away until there is tension in the tubing or band. Hold onto a chair as needed for balance. 4. Lift your left / right leg out to your side. While you do this: ? Keep your back upright. ? Keep your shoulders over your hips. ? Keep your toes pointing forward. ? Make sure to use your hip muscles to lift your leg. Do not "throw" your leg or tip your body to lift your leg. 5. Hold this position for __________ seconds. 6. Slowly return to the starting position. Repeat __________ times. Complete this exercise __________ times a day. Exercise J: Squats (Quadriceps) 1. Stand in a door frame so your feet and knees are in line with the frame. You  may place your hands on the frame for balance. 2. Slowly bend your knees and lower your hips like you are going to sit in a chair. ? Keep your lower legs in a straight-up-and-down position. ? Do not let your hips go lower than your knees. ? Do not bend your knees lower than told by your health care provider. ? If your hip pain increases, do not bend as low. 3. Hold this position for ___________ seconds. 4. Slowly push with your legs to return to standing. Do not use your hands to pull yourself to standing. Repeat __________ times. Complete this exercise __________ times a day. This information is not intended to replace advice given to you by your health care provider. Make sure you discuss any questions you have with your health care provider. Document Released: 01/16/2005 Document Revised: 09/23/2015 Document Reviewed: 12/24/2014 Elsevier Interactive Patient Education  [employer].

## 2017-08-31 ENCOUNTER — Telehealth (INDEPENDENT_AMBULATORY_CARE_PROVIDER_SITE_OTHER): Payer: Self-pay | Admitting: *Deleted

## 2017-08-31 DIAGNOSIS — M25552 Pain in left hip: Secondary | ICD-10-CM

## 2017-08-31 DIAGNOSIS — Z8739 Personal history of other diseases of the musculoskeletal system and connective tissue: Secondary | ICD-10-CM

## 2017-08-31 NOTE — Telephone Encounter (Signed)
Left message on machine to advise Brittney Ray that the new order has been placed for an MRI without contrast. Order for CT has been canceled.

## 2017-08-31 NOTE — Telephone Encounter (Signed)
Received vm from Brittney Ray central scheduling stating pt has allergy to contrast media for CT Hip. Wants to know if you want ok to change it to without contrast. Also, pt thought she was suppose to get MRI instead of CT. Please advise.   CB# [phone] to [facility] with schedulling

## 2017-09-15 ENCOUNTER — Telehealth: Payer: Self-pay | Admitting: Rheumatology

## 2017-09-15 NOTE — Telephone Encounter (Signed)
I left a voicemail for the patient to return my call at the office. Pt has a history of Paget's disease, and we would like to further evaluate with a MRI of the left hip since she is having increased pain.  Pt's with paget's disease are at higher risk of fractures and bone cancers.

## 2017-09-15 NOTE — Telephone Encounter (Signed)
Patient called stating she is not sure why Dr. Estanislado Pandy wants her to have an MRI of her hip.  Patient states she has bone deterioration so she doesn't think she would be a candidate for surgery.  Patient requested a return call.

## 2017-09-16 MED ORDER — DIAZEPAM 5 MG PO TABS: ORAL_TABLET | ORAL | 0 refills | Status: DC

## 2017-09-16 NOTE — Telephone Encounter (Signed)
Please call in Valium 5 mg p.o. 1 tablet prior to procedure.

## 2017-09-16 NOTE — Telephone Encounter (Signed)
Called patient and advised her we would send in prescription for valium 5mg . Patient verbalized understanding.

## 2017-09-16 NOTE — Telephone Encounter (Signed)
Explained to the patient why the MRI was ordered and she verbalized understanding. Patient states she does not do well in MRI machines and is requesting something to calm her anxiety prior to the scan. Please advise.

## 2017-09-16 NOTE — Addendum Note (Signed)
Addended by: Earnestine Mealing on: 09/16/2017 02:45 PM   Modules accepted: Orders

## 2017-09-21 ENCOUNTER — Ambulatory Visit
Admission: RE | Admit: 2017-09-21 | Discharge: 2017-09-21 | Disposition: A | Discharge status: Home / Self Care | Payer: Medicare Other | Source: Ambulatory Visit | Attending: Rheumatology | Admitting: Rheumatology

## 2017-09-21 DIAGNOSIS — M5137 Other intervertebral disc degeneration, lumbosacral region: Secondary | ICD-10-CM | POA: Insufficient documentation

## 2017-09-21 DIAGNOSIS — M25552 Pain in left hip: Secondary | ICD-10-CM | POA: Insufficient documentation

## 2017-09-21 DIAGNOSIS — M88852 Osteitis deformans of left thigh: Secondary | ICD-10-CM | POA: Insufficient documentation

## 2017-09-21 DIAGNOSIS — Z8739 Personal history of other diseases of the musculoskeletal system and connective tissue: Secondary | ICD-10-CM | POA: Insufficient documentation

## 2017-09-21 NOTE — Progress Notes (Signed)
I notified patient of MRI results

## 2017-09-22 ENCOUNTER — Encounter: Payer: Self-pay | Admitting: Physical Therapy

## 2017-09-22 ENCOUNTER — Ambulatory Visit: Payer: Medicare Other | Attending: Rheumatology | Admitting: Physical Therapy

## 2017-09-22 ENCOUNTER — Ambulatory Visit: Payer: Medicare Other | Admitting: Physical Therapy

## 2017-09-22 DIAGNOSIS — M25561 Pain in right knee: Secondary | ICD-10-CM | POA: Insufficient documentation

## 2017-09-22 DIAGNOSIS — G8929 Other chronic pain: Secondary | ICD-10-CM

## 2017-09-22 NOTE — Therapy (Signed)
[facility] [address], [place], [address] Phone: [phone]   Fax:  [phone]  Physical Therapy Evaluation  Patient Details  Name: Brittney Ray MRN: [id number] Date of Birth: 04-23-1950 No data recorded  Encounter Date: 09/22/2017    Past Medical History:  Diagnosis Date  . Anxiety    panic attacks; no curren med.  . Arthritis    knees  . Breast cancer ([place]) 2014   RT LUMPECTOMY  . Diabetes mellitus    NIDDM  . GERD (gastroesophageal reflux disease)   . H/O hiatal hernia   . Heart murmur    states never had any problems  . Hypertension    under control, has been on med. > 10 yrs.  . Hypothyroidism   . Medial meniscus tear 04/2011   right knee  . Nocturia    3-4 x/night  . Paget's bone disease    hips  . Personal history of radiation therapy 2014   right BREAST CA  . PONV (postoperative nausea and vomiting)     Past Surgical History:  Procedure Laterality Date  . ABDOMINAL HYSTERECTOMY  1978   partial  . BACK SURGERY  04/1990, 1996  . BREAST BIOPSY Right 03/14/2012   2 areas UOQ of DCIS  . BREAST CYST ASPIRATION Left   . BREAST LUMPECTOMY Right 03/29/2012   right breast DCIS of two areas. Clear margins  . [place] PROCEDURE  09/1989   with rectal repair  . CHOLECYSTECTOMY  11/2005  . FOOT SURGERY  1992   left  . HAND SURGERY  10/1999   nerve repair right thumb  . KNEE SURGERY  07/31/2009   right  . KNEE SURGERY  2009   left  . THYROID LOBECTOMY  03/1992   left    There were no vitals filed for this visit.   Subjective Assessment - 09/22/17 1648    Subjective  Patient arrives for evaluation and reports she does not understand why she was referred to PT as she has no pain, and "does not want to waste her time, PT's time, or insurances time". Patient reports no pain other than "a throbbing sensation in her hips when she walks and she has to sit down" . PT explained PT role  to reduce pain with walking and strengthening and increasing LE endurance so that patient will be able to walk further before "throbbing sensation" occurs. Patient was uninterested and decided she does not need PT at this time.                     Objective measurements completed on examination: See above findings.                             Patient will benefit from skilled therapeutic intervention in order to improve the following deficits and impairments:     Visit Diagnosis: Chronic pain of right knee - Plan: PT plan of care cert/re-cert     Problem List Patient Active Problem List   Diagnosis Date Noted  . Primary osteoarthritis of both hands 08/30/2017  . Trochanteric bursitis of both hips 08/30/2017  . DDD (degenerative disc disease), lumbar 08/30/2017  . H/O Paget's disease of bone 08/30/2017  . Osteopenia of neck of right femur 03/04/2017  . Primary osteoarthritis of both knees 01/10/2016  . Chronic pain of both knees 01/10/2016  . Obesity (BMI 30-39.9)  03/25/2015  . Multinodular goiter 03/04/2015  . Vaccine counseling 10/24/2014  . Postprocedural hypothyroidism 10/08/2014  . Type 2 diabetes mellitus without complication, without long-term current use of insulin ([place]) 10/08/2014  . Breast cancer in situ 08/30/2014  . Coronary artery disease 08/17/2013  . NSTEMI (non-ST elevated myocardial infarction) ([place]) 08/17/2013  . Acquired hypothyroidism 02/13/2013  . Diabetes mellitus type 2, uncomplicated ([place]) 87/21/5872  . Essential hypertension 02/13/2013  . Mixed hyperlipidemia 02/13/2013  . Personal history of breast cancer 02/13/2013   Shelton Silvas PT, DPT Shelton Silvas 09/22/2017, 4:49 PM  [facility] [address], [place], [address] Phone: [phone]   Fax:  [phone]  Name: Brittney Ray MRN: [id number] Date of Birth: 15-May-1950

## 2017-09-22 NOTE — Therapy (Signed)
[facility] [address], [place], [address] Phone: [phone]   Fax:  [phone]  Physical Therapy Treatment  Patient Details  Name: Brittney Ray MRN: [id number] Date of Birth: 01/15/1950 No data recorded  Encounter Date: 09/22/2017    Past Medical History:  Diagnosis Date  . Anxiety    panic attacks; no curren med.  . Arthritis    knees  . Breast cancer ([place]) 2014   RT LUMPECTOMY  . Diabetes mellitus    NIDDM  . GERD (gastroesophageal reflux disease)   . H/O hiatal hernia   . Heart murmur    states never had any problems  . Hypertension    under control, has been on med. > 10 yrs.  . Hypothyroidism   . Medial meniscus tear 04/2011   right knee  . Nocturia    3-4 x/night  . Paget's bone disease    hips  . Personal history of radiation therapy 2014   right BREAST CA  . PONV (postoperative nausea and vomiting)     Past Surgical History:  Procedure Laterality Date  . ABDOMINAL HYSTERECTOMY  1978   partial  . BACK SURGERY  04/1990, 1996  . BREAST BIOPSY Right 03/14/2012   2 areas UOQ of DCIS  . BREAST CYST ASPIRATION Left   . BREAST LUMPECTOMY Right 03/29/2012   right breast DCIS of two areas. Clear margins  . [place] PROCEDURE  09/1989   with rectal repair  . CHOLECYSTECTOMY  11/2005  . FOOT SURGERY  1992   left  . HAND SURGERY  10/1999   nerve repair right thumb  . KNEE SURGERY  07/31/2009   right  . KNEE SURGERY  2009   left  . THYROID LOBECTOMY  03/1992   left    There were no vitals filed for this visit.  Subjective Assessment - 09/22/17 1433    Subjective  Patient arrives for evaluation and reports she does not understand why she was referred to PT as she has no pain, and "does not want to waste her time, PT's time, or insurances time". Patient reports no pain other than "a throbbing sensation in her hips when she walks and she has to sit down" . PT explained PT role to  reduce pain with walking and strengthening and increasing LE endurance so that patient will be able to walk further before "throbbing sensation" occurs. Patient was uninterested and decided she does not need PT at this time.                                              Patient will benefit from skilled therapeutic intervention in order to improve the following deficits and impairments:     Visit Diagnosis: Chronic pain of both knees     Problem List Patient Active Problem List   Diagnosis Date Noted  . Primary osteoarthritis of both hands 08/30/2017  . Trochanteric bursitis of both hips 08/30/2017  . DDD (degenerative disc disease), lumbar 08/30/2017  . H/O Paget's disease of bone 08/30/2017  . Osteopenia of neck of right femur 03/04/2017  . Primary osteoarthritis of both knees 01/10/2016  . Chronic pain of both knees 01/10/2016  . Obesity (BMI 30-39.9) 03/25/2015  . Multinodular goiter 03/04/2015  . Vaccine counseling 10/24/2014  . Postprocedural hypothyroidism 10/08/2014  . Type  2 diabetes mellitus without complication, without long-term current use of insulin ([place]) 10/08/2014  . Breast cancer in situ 08/30/2014  . Coronary artery disease 08/17/2013  . NSTEMI (non-ST elevated myocardial infarction) ([place]) 08/17/2013  . Acquired hypothyroidism 02/13/2013  . Diabetes mellitus type 2, uncomplicated ([place]) 80/06/3492  . Essential hypertension 02/13/2013  . Mixed hyperlipidemia 02/13/2013  . Personal history of breast cancer 02/13/2013   Shelton Silvas PT, DPT Shelton Silvas 09/22/2017, 2:47 PM  [facility] [address] [place], [address] Phone: [phone]   Fax:  [phone]  Name: Brittney Ray MRN: [id number] Date of Birth: April 13, 1950

## 2017-09-24 ENCOUNTER — Ambulatory Visit: Payer: Medicare Other | Admitting: Physical Therapy

## 2017-09-27 ENCOUNTER — Ambulatory Visit: Payer: Medicare Other | Admitting: Physical Therapy

## 2017-09-29 ENCOUNTER — Ambulatory Visit: Payer: Medicare Other | Admitting: Physical Therapy

## 2017-10-04 ENCOUNTER — Encounter: Payer: No Typology Code available for payment source | Admitting: Physical Therapy

## 2017-10-06 ENCOUNTER — Encounter: Payer: No Typology Code available for payment source | Admitting: Physical Therapy

## 2017-12-03 ENCOUNTER — Observation Stay
Admission: EM | Admit: 2017-12-03 | Discharge: 2017-12-04 | Disposition: A | Discharge status: Home / Self Care | Payer: Medicare Other | Attending: Family Medicine | Admitting: Family Medicine

## 2017-12-03 ENCOUNTER — Emergency Department: Payer: Medicare Other

## 2017-12-03 ENCOUNTER — Other Ambulatory Visit: Payer: Self-pay

## 2017-12-03 ENCOUNTER — Observation Stay: Payer: Medicare Other

## 2017-12-03 ENCOUNTER — Encounter: Payer: Self-pay | Admitting: Emergency Medicine

## 2017-12-03 DIAGNOSIS — R29702 NIHSS score 2: Secondary | ICD-10-CM | POA: Insufficient documentation

## 2017-12-03 DIAGNOSIS — E890 Postprocedural hypothyroidism: Secondary | ICD-10-CM | POA: Insufficient documentation

## 2017-12-03 DIAGNOSIS — I2510 Atherosclerotic heart disease of native coronary artery without angina pectoris: Secondary | ICD-10-CM | POA: Diagnosis not present

## 2017-12-03 DIAGNOSIS — E669 Obesity, unspecified: Secondary | ICD-10-CM | POA: Insufficient documentation

## 2017-12-03 DIAGNOSIS — Z6834 Body mass index (BMI) 34.0-34.9, adult: Secondary | ICD-10-CM | POA: Diagnosis not present

## 2017-12-03 DIAGNOSIS — H538 Other visual disturbances: Secondary | ICD-10-CM | POA: Insufficient documentation

## 2017-12-03 DIAGNOSIS — Z79899 Other long term (current) drug therapy: Secondary | ICD-10-CM | POA: Diagnosis not present

## 2017-12-03 DIAGNOSIS — R200 Anesthesia of skin: Secondary | ICD-10-CM | POA: Diagnosis not present

## 2017-12-03 DIAGNOSIS — Z853 Personal history of malignant neoplasm of breast: Secondary | ICD-10-CM | POA: Diagnosis not present

## 2017-12-03 DIAGNOSIS — Z923 Personal history of irradiation: Secondary | ICD-10-CM | POA: Diagnosis not present

## 2017-12-03 DIAGNOSIS — G43909 Migraine, unspecified, not intractable, without status migrainosus: Secondary | ICD-10-CM | POA: Diagnosis present

## 2017-12-03 DIAGNOSIS — D0591 Unspecified type of carcinoma in situ of right breast: Secondary | ICD-10-CM

## 2017-12-03 DIAGNOSIS — I252 Old myocardial infarction: Secondary | ICD-10-CM | POA: Insufficient documentation

## 2017-12-03 DIAGNOSIS — M8580 Other specified disorders of bone density and structure, unspecified site: Secondary | ICD-10-CM

## 2017-12-03 DIAGNOSIS — Z7982 Long term (current) use of aspirin: Secondary | ICD-10-CM | POA: Diagnosis not present

## 2017-12-03 DIAGNOSIS — R4781 Slurred speech: Secondary | ICD-10-CM | POA: Insufficient documentation

## 2017-12-03 DIAGNOSIS — E119 Type 2 diabetes mellitus without complications: Secondary | ICD-10-CM | POA: Insufficient documentation

## 2017-12-03 DIAGNOSIS — K219 Gastro-esophageal reflux disease without esophagitis: Secondary | ICD-10-CM | POA: Diagnosis not present

## 2017-12-03 DIAGNOSIS — Z7902 Long term (current) use of antithrombotics/antiplatelets: Secondary | ICD-10-CM | POA: Insufficient documentation

## 2017-12-03 DIAGNOSIS — G459 Transient cerebral ischemic attack, unspecified: Secondary | ICD-10-CM | POA: Diagnosis not present

## 2017-12-03 DIAGNOSIS — E782 Mixed hyperlipidemia: Secondary | ICD-10-CM | POA: Diagnosis not present

## 2017-12-03 DIAGNOSIS — I10 Essential (primary) hypertension: Secondary | ICD-10-CM | POA: Insufficient documentation

## 2017-12-03 DIAGNOSIS — Z7984 Long term (current) use of oral hypoglycemic drugs: Secondary | ICD-10-CM | POA: Diagnosis not present

## 2017-12-03 DIAGNOSIS — Z79890 Hormone replacement therapy (postmenopausal): Secondary | ICD-10-CM | POA: Insufficient documentation

## 2017-12-03 LAB — CREATININE, SERUM
Creatinine, Ser: 1.13 mg/dL — ABNORMAL HIGH (ref 0.44–1.00)
GFR calc Af Amer: 57 mL/min — ABNORMAL LOW (ref >60)
GFR, EST NON AFRICAN AMERICAN: 49 mL/min — AB (ref >60)

## 2017-12-03 LAB — BASIC METABOLIC PANEL
ANION GAP: 13 (ref 5–15)
BUN: 18 mg/dL (ref 8–23)
CHLORIDE: 101 mmol/L (ref 98–111)
CO2: 24 mmol/L (ref 22–32)
Calcium: 9.1 mg/dL (ref 8.9–10.3)
Creatinine, Ser: 1.22 mg/dL — ABNORMAL HIGH (ref 0.44–1.00)
GFR, EST AFRICAN AMERICAN: 52 mL/min — AB (ref >60)
GFR, EST NON AFRICAN AMERICAN: 44 mL/min — AB (ref >60)
Glucose, Bld: 118 mg/dL — ABNORMAL HIGH (ref 70–99)
POTASSIUM: 3.4 mmol/L — AB (ref 3.5–5.1)
SODIUM: 138 mmol/L (ref 135–145)

## 2017-12-03 LAB — CBC
HCT: 44.9 % (ref 36.0–46.0)
HEMATOCRIT: 39.5 % (ref 36.0–46.0)
HEMOGLOBIN: 14.9 g/dL (ref 12.0–15.0)
Hemoglobin: 13.2 g/dL (ref 12.0–15.0)
MCH: 27.6 pg (ref 26.0–34.0)
MCH: 27.7 pg (ref 26.0–34.0)
MCHC: 33.2 g/dL (ref 30.0–36.0)
MCHC: 33.4 g/dL (ref 30.0–36.0)
MCV: 83.1 fL (ref 80.0–100.0)
MCV: 82.8 fL (ref 80.0–100.0)
NRBC: 0 % (ref 0.0–0.2)
PLATELETS: 267 10*3/uL (ref 150–400)
Platelets: 229 10*3/uL (ref 150–400)
RBC: 5.4 MIL/uL — AB (ref 3.87–5.11)
RBC: 4.77 MIL/uL (ref 3.87–5.11)
RDW: 13.6 % (ref 11.5–15.5)
RDW: 13.5 % (ref 11.5–15.5)
WBC: 13.2 10*3/uL — ABNORMAL HIGH (ref 4.0–10.5)
WBC: 11 10*3/uL — AB (ref 4.0–10.5)
nRBC: 0 % (ref 0.0–0.2)

## 2017-12-03 LAB — SEDIMENTATION RATE: SED RATE: 6 mm/h (ref 0–30)

## 2017-12-03 MED ORDER — DIPHENHYDRAMINE HCL 50 MG/ML IJ SOLN: 50.0000 mg | Freq: Once | INTRAMUSCULAR | Status: DC

## 2017-12-03 MED ORDER — LORAZEPAM 2 MG/ML IJ SOLN
INTRAMUSCULAR | Status: AC
  Filled 2017-12-03: qty 1

## 2017-12-03 MED ORDER — ASPIRIN 300 MG RE SUPP: 300.0000 mg | Freq: Every day | RECTAL | Status: DC

## 2017-12-03 MED ORDER — FERROUS SULFATE 325 (65 FE) MG PO TABS
325.0000 mg | ORAL_TABLET | Freq: Every day | ORAL | Status: DC
  Administered 2017-12-04: 09:00:00 325 mg via ORAL
  Filled 2017-12-03: qty 1

## 2017-12-03 MED ORDER — ACETAMINOPHEN 160 MG/5ML PO SOLN
650.0000 mg | ORAL | Status: DC | PRN
  Filled 2017-12-03: qty 20.3

## 2017-12-03 MED ORDER — ACETAMINOPHEN 650 MG RE SUPP: 650.0000 mg | RECTAL | Status: DC | PRN

## 2017-12-03 MED ORDER — ACETAMINOPHEN 325 MG PO TABS
650.0000 mg | ORAL_TABLET | Freq: Once | ORAL | Status: AC
  Administered 2017-12-03: 650 mg via ORAL
  Filled 2017-12-03: qty 2

## 2017-12-03 MED ORDER — ONDANSETRON HCL 4 MG/2ML IJ SOLN
4.0000 mg | Freq: Once | INTRAMUSCULAR | Status: AC
  Administered 2017-12-03: 4 mg via INTRAVENOUS
  Filled 2017-12-03: qty 2

## 2017-12-03 MED ORDER — LORAZEPAM 2 MG/ML IJ SOLN
1.0000 mg | Freq: Once | INTRAMUSCULAR | Status: AC
  Administered 2017-12-03: 1 mg via INTRAVENOUS

## 2017-12-03 MED ORDER — PANTOPRAZOLE SODIUM 40 MG PO TBEC
40.0000 mg | DELAYED_RELEASE_TABLET | Freq: Every day | ORAL | Status: DC
  Administered 2017-12-04: 40 mg via ORAL
  Filled 2017-12-03: qty 1

## 2017-12-03 MED ORDER — GADOBUTROL 1 MMOL/ML IV SOLN
9.0000 mL | Freq: Once | INTRAVENOUS | Status: AC | PRN
  Administered 2017-12-03: 9 mL via INTRAVENOUS

## 2017-12-03 MED ORDER — DIPHENHYDRAMINE HCL 25 MG PO CAPS: 50.0000 mg | ORAL_CAPSULE | Freq: Once | ORAL | Status: DC

## 2017-12-03 MED ORDER — STROKE: EARLY STAGES OF RECOVERY BOOK
Freq: Once | Status: AC
  Administered 2017-12-03: 1

## 2017-12-03 MED ORDER — ASPIRIN EC 325 MG PO TBEC
325.0000 mg | DELAYED_RELEASE_TABLET | Freq: Every day | ORAL | Status: DC
  Administered 2017-12-03 – 2017-12-04 (×2): 325 mg via ORAL
  Filled 2017-12-03 (×2): qty 1

## 2017-12-03 MED ORDER — ACETAMINOPHEN 325 MG PO TABS
650.0000 mg | ORAL_TABLET | ORAL | Status: DC | PRN
  Administered 2017-12-04: 650 mg via ORAL
  Filled 2017-12-03: qty 2

## 2017-12-03 MED ORDER — FENOFIBRATE 160 MG PO TABS
160.0000 mg | ORAL_TABLET | Freq: Every day | ORAL | Status: DC
  Administered 2017-12-04: 160 mg via ORAL
  Filled 2017-12-03 (×2): qty 1

## 2017-12-03 MED ORDER — CLOPIDOGREL BISULFATE 75 MG PO TABS
75.0000 mg | ORAL_TABLET | Freq: Every day | ORAL | Status: DC
  Administered 2017-12-04: 75 mg via ORAL
  Filled 2017-12-03: qty 1

## 2017-12-03 MED ORDER — HYDROCORTISONE NA SUCCINATE PF 250 MG IJ SOLR
200.0000 mg | Freq: Once | INTRAMUSCULAR | Status: DC
  Filled 2017-12-03: qty 200

## 2017-12-03 MED ORDER — ENOXAPARIN SODIUM 40 MG/0.4ML ~~LOC~~ SOLN: 40.0000 mg | SUBCUTANEOUS | Status: DC

## 2017-12-03 MED ORDER — LEVOTHYROXINE SODIUM 50 MCG PO TABS
50.0000 ug | ORAL_TABLET | Freq: Every day | ORAL | Status: DC
  Administered 2017-12-04: 50 ug via ORAL
  Filled 2017-12-03: qty 1

## 2017-12-03 MED ORDER — PRAVASTATIN SODIUM 20 MG PO TABS
20.0000 mg | ORAL_TABLET | Freq: Every day | ORAL | Status: DC
  Administered 2017-12-04: 09:00:00 20 mg via ORAL
  Filled 2017-12-03: qty 1

## 2017-12-03 NOTE — ED Notes (Signed)
Pt to MRI

## 2017-12-03 NOTE — ED Notes (Signed)
Pt to desk reporting a headache and general malaise x 4 days. Pt coughing. RN placed pt in mask.

## 2017-12-03 NOTE — ED Provider Notes (Signed)
[facility] Emergency Department Provider Note   ____________________________________________   First MD Initiated Contact with Patient 12/03/17 1716     (approximate)  I have reviewed the triage vital signs and the nursing notes.   HISTORY  Chief Complaint Migraine    HPI Brittney Ray is a 67 y.o. female reports gradual onset of a left-sided headache 4 days ago it comes and goes seems to be worse at night.  She also reports about 3 days of loss of left vision..  She is also nauseated.  Patient's past medical history noted below.  Headache is mild to moderately severe depending on the time course of the headache is waxing or waning.  Seems to be deep and boring nothing seems to make it better or worse.Marland Kitchen   Past Medical History:  Diagnosis Date  . Anxiety    panic attacks; no curren med.  . Arthritis    knees  . Breast cancer ([place]) 2014   RT LUMPECTOMY  . Diabetes mellitus    NIDDM  . GERD (gastroesophageal reflux disease)   . H/O hiatal hernia   . Heart murmur    states never had any problems  . Hypertension    under control, has been on med. > 10 yrs.  . Hypothyroidism   . Medial meniscus tear 04/2011   right knee  . Nocturia    3-4 x/night  . Paget's bone disease    hips  . Personal history of radiation therapy 2014   right BREAST CA  . PONV (postoperative nausea and vomiting)     Patient Active Problem List   Diagnosis Date Noted  . TIA (transient ischemic attack) 12/03/2017  . Primary osteoarthritis of both hands 08/30/2017  . Trochanteric bursitis of both hips 08/30/2017  . DDD (degenerative disc disease), lumbar 08/30/2017  . H/O Paget's disease of bone 08/30/2017  . Osteopenia of neck of right femur 03/04/2017  . Primary osteoarthritis of both knees 01/10/2016  . Chronic pain of both knees 01/10/2016  . Obesity (BMI 30-39.9) 03/25/2015  . Multinodular goiter 03/04/2015  . Vaccine counseling 10/24/2014  .  Postprocedural hypothyroidism 10/08/2014  . Type 2 diabetes mellitus without complication, without long-term current use of insulin ([place]) 10/08/2014  . Breast cancer in situ 08/30/2014  . Coronary artery disease 08/17/2013  . NSTEMI (non-ST elevated myocardial infarction) ([place]) 08/17/2013  . Acquired hypothyroidism 02/13/2013  . Diabetes mellitus type 2, uncomplicated ([place]) 60/63/0160  . Essential hypertension 02/13/2013  . Mixed hyperlipidemia 02/13/2013  . Personal history of breast cancer 02/13/2013    Past Surgical History:  Procedure Laterality Date  . ABDOMINAL HYSTERECTOMY  1978   partial  . BACK SURGERY  04/1990, 1996  . BREAST BIOPSY Right 03/14/2012   2 areas UOQ of DCIS  . BREAST CYST ASPIRATION Left   . BREAST LUMPECTOMY Right 03/29/2012   right breast DCIS of two areas. Clear margins  . [place] PROCEDURE  09/1989   with rectal repair  . CHOLECYSTECTOMY  11/2005  . FOOT SURGERY  1992   left  . HAND SURGERY  10/1999   nerve repair right thumb  . KNEE SURGERY  07/31/2009   right  . KNEE SURGERY  2009   left  . THYROID LOBECTOMY  03/1992   left    Prior to Admission medications   Medication Sig Start Date End Date Taking? Authorizing Provider  aspirin 325 MG tablet Take 325 mg by mouth daily.    Yes [provider]  Calcium Carb-Cholecalciferol (CALCIUM 600/VITAMIN D3) 600-800 MG-UNIT TABS Take 2 tablets by mouth daily with breakfast.    Yes [provider]  cholecalciferol (VITAMIN D3) 25 MCG (1000 UT) tablet Take 1,000 Units by mouth daily.   Yes [provider]  clopidogrel (PLAVIX) 75 MG tablet Take 75 mg by mouth daily.    Yes [provider]  ferrous sulfate 325 (65 FE) MG tablet Take 325 mg by mouth daily.    Yes [provider]  glimepiride (AMARYL) 1 MG tablet Take 1 mg by mouth at bedtime.    Yes [provider]  levothyroxine (SYNTHROID, LEVOTHROID) 50 MCG tablet Take 50 mcg by mouth daily.    Yes  [provider]  lisinopril-hydrochlorothiazide (PRINZIDE,ZESTORETIC) 10-12.5 MG tablet Take 1 tablet by mouth daily.    Yes [provider]  metFORMIN (GLUCOPHAGE) 500 MG tablet Take 1,000 mg by mouth 2 (two) times daily.    Yes [provider]  metoprolol tartrate (LOPRESSOR) 25 MG tablet Take 12.5 mg by mouth 2 (two) times daily.    Yes [provider]  nitroGLYCERIN (NITROSTAT) 0.4 MG SL tablet Place 0.4 mg under the tongue every 5 (five) minutes as needed for chest pain (up to 3 doses).    Yes [provider]  pantoprazole (PROTONIX) 40 MG tablet Take 40 mg by mouth daily.    Yes [provider]  pravastatin (PRAVACHOL) 20 MG tablet Take 20 mg by mouth daily.    Yes [provider]  diazepam (VALIUM) 5 MG tablet Take 1 tablet by mouth prior to procedure. Patient not taking: Reported on 12/03/2017 09/16/17   Bo Merino, MD    Allergies Contrast media [iodinated diagnostic agents]; Codeine; Morphine and related; and Sulfa antibiotics  Family History  Problem Relation Age of Onset  . Breast cancer Sister 20  . Breast cancer Cousin     Social History Social History   Tobacco Use  . Smoking status: Never Smoker  . Smokeless tobacco: Never Used  Substance Use Topics  . Alcohol use: No  . Drug use: Never    Review of Systems  Constitutional: No fever/chills Eyes: No visual changes. ENT: No sore throat. Cardiovascular: Denies chest pain. Respiratory: Denies shortness of breath. Gastrointestinal: No abdominal pain.   nausea,  vomiting.  No diarrhea.  No constipation. Genitourinary: Negative for dysuria. Musculoskeletal: Negative for back pain. Skin: Negative for rash. Neurological: Negative for focal weakness   ____________________________________________   PHYSICAL EXAM:  VITAL SIGNS: ED Triage Vitals  Enc Vitals Group     BP 12/03/17 1634 (!) 147/82     Pulse Rate 12/03/17 1634 95     Resp 12/03/17  1634 18     Temp 12/03/17 1634 97.7 F (36.5 C)     Temp Source 12/03/17 1634 Oral     SpO2 12/03/17 1634 97 %     Weight 12/03/17 1638 195 lb (88.5 kg)     Height 12/03/17 1638 5\' 2"  (1.575 m)     Head Circumference --      Peak Flow --      Pain Score 12/03/17 1635 10     Pain Loc --      Pain Edu? --      Excl. in [place]? --    Constitutional: Alert and oriented.  Wincing in pain from her headache Eyes: Conjunctivae are normal. PERRL. EOMI. Head: Atraumatic. Nose: No congestion/rhinnorhea. Mouth/Throat: Mucous membranes are moist.  Oropharynx non-erythematous.  Neck: No stridor.   Cardiovascular: Normal rate, regular rhythm. Grossly normal heart sounds.  Good peripheral circulation. Respiratory: Normal respiratory effort.  No retractions. Lungs CTAB. Gastrointestinal: Soft and nontender. No distention. No abdominal bruits. No CVA tenderness. Musculoskeletal: No lower extremity tenderness nor edema.  No joint effusions. Neurologic:  Normal speech and language.  No numbness or weakness but patient reports loss of left visual field. Skin:  Skin is warm, dry and intact. No rash noted. Psychiatric: Mood and affect are normal. Speech and behavior are normal.  ____________________________________________   LABS (all labs ordered are listed, but only abnormal results are displayed)  Labs Reviewed  CBC - Abnormal; Notable for the following components:      Result Value   WBC 13.2 (*)    RBC 5.40 (*)    All other components within normal limits  BASIC METABOLIC PANEL - Abnormal; Notable for the following components:   Potassium 3.4 (*)    Glucose, Bld 118 (*)    Creatinine, Ser 1.22 (*)    GFR calc non Af Amer 44 (*)    GFR calc Af Amer 52 (*)    All other components within normal limits  CBC - Abnormal; Notable for the following components:   WBC 11.0 (*)    All other components within normal limits  CREATININE, SERUM - Abnormal; Notable for the following components:    Creatinine, Ser 1.13 (*)    GFR calc non Af Amer 49 (*)    GFR calc Af Amer 57 (*)    All other components within normal limits  SEDIMENTATION RATE  HIV ANTIBODY (ROUTINE TESTING W REFLEX)  LIPID PANEL   ____________________________________________  EKG   ____________________________________________  RADIOLOGY  ED MD interpretation: Computer read as no acute changes.  There are left basilar infarcts.  Official radiology report(s): Dg Chest 2 View  Result Date: 12/03/2017 CLINICAL DATA:  Dry cough and headache with weakness x4 days. EXAM: CHEST - 2 VIEW COMPARISON:  06/23/2011 FINDINGS: The heart size and mediastinal contours are within normal limits. Both lungs are clear. The visualized skeletal structures are unremarkable. IMPRESSION: No active cardiopulmonary disease. Electronically Signed   By: Ashley Royalty M.D.   On: 12/03/2017 18:18   Ct Head Wo Contrast  Result Date: 12/03/2017 CLINICAL DATA:  Acute severe headache EXAM: CT HEAD WITHOUT CONTRAST TECHNIQUE: Contiguous axial images were obtained from the base of the skull through the vertex without intravenous contrast. COMPARISON:  01/26/2008 FINDINGS: BRAIN: The ventricles and sulci are normal. Chronic minimal small vessel ischemic disease of periventricular white matter. No intraparenchymal hemorrhage, mass effect nor midline shift. No acute large vascular territory infarcts. Grey-white matter distinction is maintained. Tiny chronic left basal ganglial lacunar infarcts. No abnormal extra-axial fluid collections. Basal cisterns are not effaced and midline. The brainstem and cerebellar hemispheres are without acute abnormalities. VASCULAR: Unremarkable. SKULL/SOFT TISSUES: No skull fracture. No significant soft tissue swelling. ORBITS/SINUSES: The included ocular globes and orbital contents are normal.The mastoid air cells are clear. The included paranasal sinuses are well-aerated. OTHER: None. IMPRESSION: Chronic minimal small vessel  ischemic disease and left basal ganglial lacunar infarcts. No acute intracranial abnormality. Electronically Signed   By: Ashley Royalty M.D.   On: 12/03/2017 17:12   Mr Jodene Nam Head Wo Contrast  Result Date: 12/03/2017 CLINICAL DATA:  Initial evaluation for acute left-sided visual loss. EXAM: MRI HEAD WITHOUT CONTRAST MRA HEAD WITHOUT CONTRAST MRA NECK WITHOUT AND WITH CONTRAST TECHNIQUE: Multiplanar, multiecho pulse sequences of the brain  and surrounding structures were obtained without intravenous contrast. Angiographic images of the Circle of Willis were obtained using MRA technique without intravenous contrast. Angiographic images of the neck were obtained using MRA technique without and with intravenous contrast. Carotid stenosis measurements (when applicable) are obtained utilizing NASCET criteria, using the distal internal carotid diameter as the denominator. CONTRAST:  9 cc of Gadavist. COMPARISON:  Prior CT from earlier the same day. FINDINGS: MRI HEAD FINDINGS Cerebral volume within normal limits for age. Mild chronic microvascular ischemic changes present within the periventricular and deep white matter both cerebral hemispheres. No abnormal foci of restricted diffusion to suggest acute or subacute ischemia. No encephalomalacia to suggest chronic infarction. No foci of susceptibility artifact to suggest acute or chronic intracranial hemorrhage. No mass lesion, midline shift or mass effect. No hydrocephalus. No extra-axial fluid collection. Normal pituitary gland. Major intracranial vascular flow voids are maintained. Craniocervical junction within normal limits. Bone marrow signal intensity normal. Hyperostosis frontalis interna noted. Scalp soft tissues unremarkable. Globes and orbital soft tissues within normal limits. Paranasal sinuses are clear. No mastoid effusion. Inner ear structures normal. MRA HEAD FINDINGS ANTERIOR CIRCULATION: Distal cervical segments of the internal carotid arteries are widely  patent with symmetric antegrade flow. Petrous segments patent bilaterally. Diffuse atheromatous irregularity throughout the cavernous/supraclinoid ICAs without high-grade stenosis. ICA termini widely patent. A1 segments mildly irregular but patent bilaterally. Anterior cerebral arteries patent to their distal aspects without stenosis. M1 segments widely patent without hemodynamically significant stenosis. Normal MCA bifurcations. No proximal M2 occlusion. Distal MCA branches well perfused and symmetric. Distal small vessel atheromatous irregularity noted within the MCA branches bilaterally. POSTERIOR CIRCULATION: Dominant left vertebral artery patent to the vertebrobasilar junction without stenosis. Short-segment severe distal right V4 stenosis just beyond the takeoff of the right PICA (series 1050, image 12). Posterior inferior cerebral arteries patent proximally. Basilar patent to its distal aspect without hemodynamically significant stenosis. Superior cerebral arteries patent bilaterally. Left PCA supplied largely via the basilar, although a small left posterior communicating artery noted as well. Hypoplastic right P1 segment with predominant fetal type origin of the right PCA. Short-segment severe distal right P2 stenosis (series 1050, image 4). Distal small vessel atheromatous irregularity seen within the PCAs bilaterally. No intracranial aneurysm. MRA NECK FINDINGS Visualized aortic arch of normal caliber with normal branch pattern. No flow-limiting stenosis seen about the origin of the great vessels. Visualized subclavian arteries widely patent. Both common carotid arteries widely patent from their origins to the bifurcations without stenosis. No hemodynamically significant stenosis or significant atheromatous irregularity seen about the carotid bifurcations bilaterally. Internal carotid arteries widely patent from the bifurcations to the skull base without stenosis or occlusion. Both vertebral arteries arise  from the subclavian arteries. Left vertebral artery dominant. Vertebral arteries widely patent within the neck without stenosis or occlusion. IMPRESSION: MRI HEAD IMPRESSION: 1. No acute intracranial infarct or other abnormality identified. 2. Age-related cerebral atrophy with mild chronic small vessel ischemic disease. MRA HEAD IMPRESSION: 1. Negative intracranial MRA for large vessel occlusion. 2. Short-segment severe distal right P2 stenosis. 3. Short-segment severe distal right V4 stenosis. Dominant left vertebral artery widely patent to the vertebrobasilar junction. No other hemodynamically significant or correctable stenosis identified within the intracranial circulation. 4. Distal small vessel atheromatous irregularity. MRA NECK IMPRESSION: 1. Negative MRA of the neck. No hemodynamically significant stenosis seen involving either carotid artery system. 2. Vertebral arteries widely patent within the neck. Left vertebral artery dominant. Electronically Signed   By: Pincus Badder.D.  On: 12/03/2017 22:16   Mr Angiogram Neck W Or Wo Contrast  Result Date: 12/03/2017 CLINICAL DATA:  Initial evaluation for acute left-sided visual loss. EXAM: MRI HEAD WITHOUT CONTRAST MRA HEAD WITHOUT CONTRAST MRA NECK WITHOUT AND WITH CONTRAST TECHNIQUE: Multiplanar, multiecho pulse sequences of the brain and surrounding structures were obtained without intravenous contrast. Angiographic images of the Circle of Willis were obtained using MRA technique without intravenous contrast. Angiographic images of the neck were obtained using MRA technique without and with intravenous contrast. Carotid stenosis measurements (when applicable) are obtained utilizing NASCET criteria, using the distal internal carotid diameter as the denominator. CONTRAST:  9 cc of Gadavist. COMPARISON:  Prior CT from earlier the same day. FINDINGS: MRI HEAD FINDINGS Cerebral volume within normal limits for age. Mild chronic microvascular ischemic  changes present within the periventricular and deep white matter both cerebral hemispheres. No abnormal foci of restricted diffusion to suggest acute or subacute ischemia. No encephalomalacia to suggest chronic infarction. No foci of susceptibility artifact to suggest acute or chronic intracranial hemorrhage. No mass lesion, midline shift or mass effect. No hydrocephalus. No extra-axial fluid collection. Normal pituitary gland. Major intracranial vascular flow voids are maintained. Craniocervical junction within normal limits. Bone marrow signal intensity normal. Hyperostosis frontalis interna noted. Scalp soft tissues unremarkable. Globes and orbital soft tissues within normal limits. Paranasal sinuses are clear. No mastoid effusion. Inner ear structures normal. MRA HEAD FINDINGS ANTERIOR CIRCULATION: Distal cervical segments of the internal carotid arteries are widely patent with symmetric antegrade flow. Petrous segments patent bilaterally. Diffuse atheromatous irregularity throughout the cavernous/supraclinoid ICAs without high-grade stenosis. ICA termini widely patent. A1 segments mildly irregular but patent bilaterally. Anterior cerebral arteries patent to their distal aspects without stenosis. M1 segments widely patent without hemodynamically significant stenosis. Normal MCA bifurcations. No proximal M2 occlusion. Distal MCA branches well perfused and symmetric. Distal small vessel atheromatous irregularity noted within the MCA branches bilaterally. POSTERIOR CIRCULATION: Dominant left vertebral artery patent to the vertebrobasilar junction without stenosis. Short-segment severe distal right V4 stenosis just beyond the takeoff of the right PICA (series 1050, image 12). Posterior inferior cerebral arteries patent proximally. Basilar patent to its distal aspect without hemodynamically significant stenosis. Superior cerebral arteries patent bilaterally. Left PCA supplied largely via the basilar, although a small  left posterior communicating artery noted as well. Hypoplastic right P1 segment with predominant fetal type origin of the right PCA. Short-segment severe distal right P2 stenosis (series 1050, image 4). Distal small vessel atheromatous irregularity seen within the PCAs bilaterally. No intracranial aneurysm. MRA NECK FINDINGS Visualized aortic arch of normal caliber with normal branch pattern. No flow-limiting stenosis seen about the origin of the great vessels. Visualized subclavian arteries widely patent. Both common carotid arteries widely patent from their origins to the bifurcations without stenosis. No hemodynamically significant stenosis or significant atheromatous irregularity seen about the carotid bifurcations bilaterally. Internal carotid arteries widely patent from the bifurcations to the skull base without stenosis or occlusion. Both vertebral arteries arise from the subclavian arteries. Left vertebral artery dominant. Vertebral arteries widely patent within the neck without stenosis or occlusion. IMPRESSION: MRI HEAD IMPRESSION: 1. No acute intracranial infarct or other abnormality identified. 2. Age-related cerebral atrophy with mild chronic small vessel ischemic disease. MRA HEAD IMPRESSION: 1. Negative intracranial MRA for large vessel occlusion. 2. Short-segment severe distal right P2 stenosis. 3. Short-segment severe distal right V4 stenosis. Dominant left vertebral artery widely patent to the vertebrobasilar junction. No other hemodynamically significant or correctable stenosis identified within the intracranial  circulation. 4. Distal small vessel atheromatous irregularity. MRA NECK IMPRESSION: 1. Negative MRA of the neck. No hemodynamically significant stenosis seen involving either carotid artery system. 2. Vertebral arteries widely patent within the neck. Left vertebral artery dominant. Electronically Signed   By: Jeannine Boga M.D.   On: 12/03/2017 22:16   Mr Brain Wo  Contrast  Result Date: 12/03/2017 CLINICAL DATA:  Initial evaluation for acute left-sided visual loss. EXAM: MRI HEAD WITHOUT CONTRAST MRA HEAD WITHOUT CONTRAST MRA NECK WITHOUT AND WITH CONTRAST TECHNIQUE: Multiplanar, multiecho pulse sequences of the brain and surrounding structures were obtained without intravenous contrast. Angiographic images of the Circle of Willis were obtained using MRA technique without intravenous contrast. Angiographic images of the neck were obtained using MRA technique without and with intravenous contrast. Carotid stenosis measurements (when applicable) are obtained utilizing NASCET criteria, using the distal internal carotid diameter as the denominator. CONTRAST:  9 cc of Gadavist. COMPARISON:  Prior CT from earlier the same day. FINDINGS: MRI HEAD FINDINGS Cerebral volume within normal limits for age. Mild chronic microvascular ischemic changes present within the periventricular and deep white matter both cerebral hemispheres. No abnormal foci of restricted diffusion to suggest acute or subacute ischemia. No encephalomalacia to suggest chronic infarction. No foci of susceptibility artifact to suggest acute or chronic intracranial hemorrhage. No mass lesion, midline shift or mass effect. No hydrocephalus. No extra-axial fluid collection. Normal pituitary gland. Major intracranial vascular flow voids are maintained. Craniocervical junction within normal limits. Bone marrow signal intensity normal. Hyperostosis frontalis interna noted. Scalp soft tissues unremarkable. Globes and orbital soft tissues within normal limits. Paranasal sinuses are clear. No mastoid effusion. Inner ear structures normal. MRA HEAD FINDINGS ANTERIOR CIRCULATION: Distal cervical segments of the internal carotid arteries are widely patent with symmetric antegrade flow. Petrous segments patent bilaterally. Diffuse atheromatous irregularity throughout the cavernous/supraclinoid ICAs without high-grade stenosis.  ICA termini widely patent. A1 segments mildly irregular but patent bilaterally. Anterior cerebral arteries patent to their distal aspects without stenosis. M1 segments widely patent without hemodynamically significant stenosis. Normal MCA bifurcations. No proximal M2 occlusion. Distal MCA branches well perfused and symmetric. Distal small vessel atheromatous irregularity noted within the MCA branches bilaterally. POSTERIOR CIRCULATION: Dominant left vertebral artery patent to the vertebrobasilar junction without stenosis. Short-segment severe distal right V4 stenosis just beyond the takeoff of the right PICA (series 1050, image 12). Posterior inferior cerebral arteries patent proximally. Basilar patent to its distal aspect without hemodynamically significant stenosis. Superior cerebral arteries patent bilaterally. Left PCA supplied largely via the basilar, although a small left posterior communicating artery noted as well. Hypoplastic right P1 segment with predominant fetal type origin of the right PCA. Short-segment severe distal right P2 stenosis (series 1050, image 4). Distal small vessel atheromatous irregularity seen within the PCAs bilaterally. No intracranial aneurysm. MRA NECK FINDINGS Visualized aortic arch of normal caliber with normal branch pattern. No flow-limiting stenosis seen about the origin of the great vessels. Visualized subclavian arteries widely patent. Both common carotid arteries widely patent from their origins to the bifurcations without stenosis. No hemodynamically significant stenosis or significant atheromatous irregularity seen about the carotid bifurcations bilaterally. Internal carotid arteries widely patent from the bifurcations to the skull base without stenosis or occlusion. Both vertebral arteries arise from the subclavian arteries. Left vertebral artery dominant. Vertebral arteries widely patent within the neck without stenosis or occlusion. IMPRESSION: MRI HEAD IMPRESSION: 1. No  acute intracranial infarct or other abnormality identified. 2. Age-related cerebral atrophy with mild chronic small vessel ischemic  disease. MRA HEAD IMPRESSION: 1. Negative intracranial MRA for large vessel occlusion. 2. Short-segment severe distal right P2 stenosis. 3. Short-segment severe distal right V4 stenosis. Dominant left vertebral artery widely patent to the vertebrobasilar junction. No other hemodynamically significant or correctable stenosis identified within the intracranial circulation. 4. Distal small vessel atheromatous irregularity. MRA NECK IMPRESSION: 1. Negative MRA of the neck. No hemodynamically significant stenosis seen involving either carotid artery system. 2. Vertebral arteries widely patent within the neck. Left vertebral artery dominant. Electronically Signed   By: Jeannine Boga M.D.   On: 12/03/2017 22:16    ____________________________________________   PROCEDURES  Procedure(s) performed:   Procedures  Critical Care performed:   ____________________________________________   INITIAL IMPRESSION / ASSESSMENT AND PLAN / ED COURSE  Patient with a sed rate of 6 this makes temporal arteritis unlikely.  However she is having intermittent loss of vision in her left eye.  I will admit her for TIA work-up.  We will watch her carefully we have ordered an MR a of the head and neck as she has an allergy to contrast dye.  MRA should be much quicker than doing the prep for the contrast allergy and safer to.         ____________________________________________   FINAL CLINICAL IMPRESSION(S) / ED DIAGNOSES  Final diagnoses:  TIA (transient ischemic attack)     ED Discharge Orders    None       Note:  This document was prepared using Dragon voice recognition software and may include unintentional dictation errors.    Nena Polio, MD 12/03/17 2322

## 2017-12-03 NOTE — ED Triage Notes (Signed)
Pt in via POV with complaints headache x four days w/ associated N/V and photophobia.  Vitals WDL at this time.

## 2017-12-03 NOTE — ED Notes (Signed)
Pt presentation discussed with EDP, Archie Balboa.  See new orders.

## 2017-12-03 NOTE — H&P (Signed)
[facility] NAME: Brittney Ray    MR#:  [id number]  DATE OF BIRTH:  1950/01/15  DATE OF ADMISSION:  12/03/2017  PRIMARY CARE PHYSICIAN: Dion Body, MD   REQUESTING/REFERRING PHYSICIAN:   CHIEF COMPLAINT:   Chief Complaint  Patient presents with  . Migraine    HISTORY OF PRESENT ILLNESS: Brittney Ray  is a 67 y.o. female with a known history of diabetes mellitus type 2, breast cancer, hypothyroidism, hypertension, Paget's disease of bone, basal ganglia infarcts presented to the emergency room headache as well as blurry vision.  Patient also complains of numbness in the hands and both upper extremities as well as had some slurred speech.  Symptoms started around 10 AM this morning.  Patient was worked up with CT head which showed old basal ganglia infarcts but no acute abnormality.  Patient is due for MRI and MRA brain.  Hospitalist service was consulted for further care.  Patient is on aspirin Plavix at home.  PAST MEDICAL HISTORY:   Past Medical History:  Diagnosis Date  . Anxiety    panic attacks; no curren med.  . Arthritis    knees  . Breast cancer ([place]) 2014   RT LUMPECTOMY  . Diabetes mellitus    NIDDM  . GERD (gastroesophageal reflux disease)   . H/O hiatal hernia   . Heart murmur    states never had any problems  . Hypertension    under control, has been on med. > 10 yrs.  . Hypothyroidism   . Medial meniscus tear 04/2011   right knee  . Nocturia    3-4 x/night  . Paget's bone disease    hips  . Personal history of radiation therapy 2014   right BREAST CA  . PONV (postoperative nausea and vomiting)     PAST SURGICAL HISTORY:  Past Surgical History:  Procedure Laterality Date  . ABDOMINAL HYSTERECTOMY  1978   partial  . BACK SURGERY  04/1990, 1996  . BREAST BIOPSY Right 03/14/2012   2 areas UOQ of DCIS  . BREAST CYST ASPIRATION Left   . BREAST LUMPECTOMY Right 03/29/2012    right breast DCIS of two areas. Clear margins  . [place] PROCEDURE  09/1989   with rectal repair  . CHOLECYSTECTOMY  11/2005  . FOOT SURGERY  1992   left  . HAND SURGERY  10/1999   nerve repair right thumb  . KNEE SURGERY  07/31/2009   right  . KNEE SURGERY  2009   left  . THYROID LOBECTOMY  03/1992   left    SOCIAL HISTORY:  Social History   Tobacco Use  . Smoking status: Never Smoker  . Smokeless tobacco: Never Used  Substance Use Topics  . Alcohol use: No    FAMILY HISTORY:  Family History  Problem Relation Age of Onset  . Breast cancer Sister 80  . Breast cancer Cousin     DRUG ALLERGIES:  Allergies  Allergen Reactions  . Contrast Media [Iodinated Diagnostic Agents] Hives and Cough  . Codeine Anxiety  . Morphine And Related Other (See Comments)    Tremors  . Sulfa Antibiotics Nausea And Vomiting    REVIEW OF SYSTEMS:   CONSTITUTIONAL: No fever, fatigue or weakness.  EYES: Has blurry vision vision.  EARS, NOSE, AND THROAT: No tinnitus or ear pain.  RESPIRATORY: No cough, shortness of breath, wheezing or hemoptysis.  CARDIOVASCULAR: No chest pain, orthopnea, edema.  GASTROINTESTINAL: No nausea,  vomiting, diarrhea or abdominal pain.  GENITOURINARY: No dysuria, hematuria.  ENDOCRINE: No polyuria, nocturia,  HEMATOLOGY: No anemia, easy bruising or bleeding SKIN: No rash or lesion. MUSCULOSKELETAL: No joint pain or arthritis.   NEUROLOGIC: No tingling,has numbness, no weakness.  Had some slurred speech PSYCHIATRY: No anxiety or depression.   MEDICATIONS AT HOME:  Prior to Admission medications   Medication Sig Start Date End Date Taking? Authorizing Provider  aspirin 325 MG tablet Take 325 mg by mouth daily.    Yes [provider]  Calcium Carb-Cholecalciferol (CALCIUM 600/VITAMIN D3) 600-800 MG-UNIT TABS Take 2 tablets by mouth daily with breakfast.    Yes [provider]  clopidogrel (PLAVIX) 75 MG tablet Take 75 mg by mouth daily.    Yes  [provider]  ferrous sulfate 325 (65 FE) MG tablet Take 325 mg by mouth daily.    Yes [provider]  glimepiride (AMARYL) 1 MG tablet Take 1 mg by mouth at bedtime.    Yes [provider]  levothyroxine (SYNTHROID, LEVOTHROID) 50 MCG tablet Take 50 mcg by mouth daily.    Yes [provider]  lisinopril-hydrochlorothiazide (PRINZIDE,ZESTORETIC) 10-12.5 MG tablet Take 1 tablet by mouth daily.    Yes [provider]  metFORMIN (GLUCOPHAGE) 500 MG tablet Take 1,000 mg by mouth 2 (two) times daily.    Yes [provider]  metoprolol tartrate (LOPRESSOR) 25 MG tablet TAKE 1/2 TABLET  TWICE A DAY 08/14/15  Yes [provider]  pantoprazole (PROTONIX) 40 MG tablet TAKE 1 TABLET ONE TIME DAILY 06/11/15  Yes [provider]  pravastatin (PRAVACHOL) 20 MG tablet TAKE 1 TABLET EVERY NIGHT 12/26/14  Yes [provider]  VITAMIN D PO Take 1,600 Units by mouth daily.    Yes [provider]  diazepam (VALIUM) 5 MG tablet Take 1 tablet by mouth prior to procedure. Patient not taking: Reported on 12/03/2017 09/16/17   Bo Merino, MD  nitroGLYCERIN (NITROSTAT) 0.4 MG SL tablet Place under the tongue.    [provider]      PHYSICAL EXAMINATION:   VITAL SIGNS: Blood pressure 140/66, pulse 90, temperature 97.7 F (36.5 C), temperature source Oral, resp. rate 18, height 5\' 2"  (1.575 m), weight 88.5 kg, SpO2 98 %.  GENERAL:  67 y.o.-year-old patient lying in the bed with no acute distress.  EYES: Pupils equal, round, reactive to light and accommodation. No scleral icterus. Extraocular muscles intact.  HEENT: Head atraumatic, normocephalic. Oropharynx and nasopharynx clear.  NECK:  Supple, no jugular venous distention. No thyroid enlargement, no tenderness.  LUNGS: Normal breath sounds bilaterally, no wheezing, rales,rhonchi or crepitation. No use of accessory muscles of respiration.  CARDIOVASCULAR: S1, S2  normal. No murmurs, rubs, or gallops.  ABDOMEN: Soft, nontender, nondistended. Bowel sounds present. No organomegaly or mass.  EXTREMITIES: No pedal edema, cyanosis, or clubbing.  NEUROLOGIC: Cranial nerves II through XII are intact. Muscle strength 5/5 in all extremities. Sensation intact. Gait not checked.  PSYCHIATRIC: The patient is alert and oriented x 3.  SKIN: No obvious rash, lesion, or ulcer.   LABORATORY PANEL:   CBC Recent Labs  Lab 12/03/17 1647  WBC 13.2*  HGB 14.9  HCT 44.9  PLT 267  MCV 83.1  MCH 27.6  MCHC 33.2  RDW 13.6   ------------------------------------------------------------------------------------------------------------------  Chemistries  Recent Labs  Lab 12/03/17 1647  NA 138  K 3.4*  CL 101  CO2 24  GLUCOSE 118*  BUN 18  CREATININE 1.22*  CALCIUM 9.1   ------------------------------------------------------------------------------------------------------------------ estimated creatinine clearance is 45.6 mL/min (A) (by C-G formula based on SCr of 1.22 mg/dL (H)). ------------------------------------------------------------------------------------------------------------------ No results for input(s): TSH, T4TOTAL, T3FREE, THYROIDAB in the last 72 hours.  Invalid input(s): FREET3   Coagulation profile No results for input(s): INR, PROTIME in the last 168 hours. ------------------------------------------------------------------------------------------------------------------- No results for input(s): DDIMER in the last 72 hours. -------------------------------------------------------------------------------------------------------------------  Cardiac Enzymes No results for input(s): CKMB, TROPONINI, MYOGLOBIN in the last 168 hours.  Invalid input(s): CK ------------------------------------------------------------------------------------------------------------------ Invalid input(s):  POCBNP  ---------------------------------------------------------------------------------------------------------------  Urinalysis    Component Value Date/Time   COLORURINE Yellow 06/23/2011 1516   APPEARANCEUR Hazy 06/23/2011 1516   LABSPEC 1.013 06/23/2011 1516   PHURINE 5.0 06/23/2011 1516   GLUCOSEU Negative 06/23/2011 1516   HGBUR Negative 06/23/2011 1516   BILIRUBINUR Negative 06/23/2011 1516   KETONESUR Negative 06/23/2011 1516   PROTEINUR Negative 06/23/2011 1516   NITRITE Negative 06/23/2011 1516   LEUKOCYTESUR Negative 06/23/2011 1516     RADIOLOGY: Dg Chest 2 View  Result Date: 12/03/2017 CLINICAL DATA:  Dry cough and headache with weakness x4 days. EXAM: CHEST - 2 VIEW COMPARISON:  06/23/2011 FINDINGS: The heart size and mediastinal contours are within normal limits. Both lungs are clear. The visualized skeletal structures are unremarkable. IMPRESSION: No active cardiopulmonary disease. Electronically Signed   By: Ashley Royalty M.D.   On: 12/03/2017 18:18   Ct Head Wo Contrast  Result Date: 12/03/2017 CLINICAL DATA:  Acute severe headache EXAM: CT HEAD WITHOUT CONTRAST TECHNIQUE: Contiguous axial images were obtained from the base of the skull through the vertex without intravenous contrast. COMPARISON:  01/26/2008 FINDINGS: BRAIN: The ventricles and sulci are normal. Chronic minimal small vessel ischemic disease of periventricular white matter. No intraparenchymal hemorrhage, mass effect nor midline shift. No acute large vascular territory infarcts. Grey-white matter distinction is maintained. Tiny chronic left basal ganglial lacunar infarcts. No abnormal extra-axial fluid collections. Basal cisterns are not effaced and midline. The brainstem and cerebellar hemispheres are without acute abnormalities. VASCULAR: Unremarkable. SKULL/SOFT TISSUES: No skull fracture. No significant soft tissue swelling. ORBITS/SINUSES: The included ocular globes and orbital contents are  normal.The mastoid air cells are clear. The included paranasal sinuses are well-aerated. OTHER: None. IMPRESSION: Chronic minimal small vessel ischemic disease and left basal ganglial lacunar infarcts. No acute intracranial abnormality. Electronically Signed   By: Ashley Royalty M.D.   On: 12/03/2017 17:12    EKG: Orders placed or performed in visit on 10/19/14  . EKG    IMPRESSION AND PLAN:  67 year old female patient with history of hypertension, type 2 diabetes mellitus, Paget's disease of bone, breast cancer, basal ganglia infarct presented to the emergency room for blurry vision and numbness in the hands  -Transient ischemic attack Admit patient to medical floor Stroke work-up with MRI and MRA brain Carotid ultrasound and echocardiogram Continue aspirin and Plavix Neurology consultation Neurochecks  -Type 2 diabetes mellitus Once patient passes swallow study diabetic diet with sliding scale coverage with insulin  -Hypertension We will hold off on ACE inhibitor for now as blood pressure is stable and to allow improved blood circulation to the brain.  -DVT prophylaxis subcu Lovenox daily  -History of breast cancer Supportive care  All the records are reviewed and case discussed with ED provider. Management plans discussed with the patient, family and they are in agreement.  CODE STATUS:Full code    TOTAL TIME TAKING CARE OF THIS PATIENT: 52 minutes.    Saundra Shelling M.D on 12/03/2017 at  7:30 PM  Between 7am to 6pm - Pager - [phone]  After 6pm go to [email or web address] - password [email or web address] [facility]  Office  [phone]  CC: Primary care physician; Dion Body, MD

## 2017-12-03 NOTE — Progress Notes (Signed)
Advanced care plan.  Purpose of the Encounter: CODE STATUS  Parties in Attendance: Patient and family  Patient's Decision Capacity: Good  Subjective/Patient's story: Presented to the emergency room for difficulty in vision   Objective/Medical story Patient presented to the emergency room for some headache as well as trouble in vision Her vision has improved after coming to the emergency room Has some slurred speech and numbness in the upper extremities Needs work-up for stroke   Goals of care determination:  Advanced care directives and goals of care discussed Patient and family want everything done which include CPR, intubation and ventilator if the need arises   CODE STATUS: Full code   Time spent discussing advanced care planning: 16 minutes

## 2017-12-03 NOTE — ED Notes (Signed)
Patient transported to X-ray 

## 2017-12-03 NOTE — ED Notes (Signed)
.  This RN Verlene Mayer received verbal orders to administer: 1mg  of Ativan for MRI when pt is in transit to MRI. If pt still needs then give 1mg  Ativan for the MRI.   From [facility] EDP RN will continue to monitor. As well will handoff to next RN

## 2017-12-03 NOTE — ED Notes (Signed)
ED TO INPATIENT HANDOFF REPORT  Name/Age/Gender Brittney Ray 67 y.o. female  Code Status   Home/SNF/Other Home  Chief Complaint Headache x4 Days  Level of Care/Admitting Diagnosis ED Disposition    ED Disposition Condition [place]: [place] [[id number]]  Level of Care: Med-Surg [16]  Diagnosis: TIA (transient ischemic attack) [045409]  Admitting Physician: Saundra Shelling [[id number]]  Attending Physician: Saundra Shelling [[id number]]  PT Class (Do Not Modify): Observation [104]  PT Acc Code (Do Not Modify): Observation [10022]       Medical History Past Medical History:  Diagnosis Date  . Anxiety    panic attacks; no curren med.  . Arthritis    knees  . Breast cancer ([place]) 2014   RT LUMPECTOMY  . Diabetes mellitus    NIDDM  . GERD (gastroesophageal reflux disease)   . H/O hiatal hernia   . Heart murmur    states never had any problems  . Hypertension    under control, has been on med. > 10 yrs.  . Hypothyroidism   . Medial meniscus tear 04/2011   right knee  . Nocturia    3-4 x/night  . Paget's bone disease    hips  . Personal history of radiation therapy 2014   right BREAST CA  . PONV (postoperative nausea and vomiting)     Allergies Allergies  Allergen Reactions  . Contrast Media [Iodinated Diagnostic Agents] Hives and Cough  . Codeine Anxiety  . Morphine And Related Other (See Comments)    Tremors  . Sulfa Antibiotics Nausea And Vomiting    IV Location/Drains/Wounds Patient Lines/Drains/Airways Status   Active Line/Drains/Airways    Name:   Placement date:   Placement time:   Site:   Days:   Peripheral IV 12/03/17 Left Antecubital   12/03/17    1803    Antecubital   less than 1   Incision 04/23/11 Other (Comment) Right   04/23/11    1432     2416          Labs/Imaging Results for orders placed or performed during the hospital encounter of 12/03/17 (from the past 48 hour(s))  CBC     Status:  Abnormal   Collection Time: 12/03/17  4:47 PM  Result Value Ref Range   WBC 13.2 (H) 4.0 - 10.5 K/uL   RBC 5.40 (H) 3.87 - 5.11 MIL/uL   Hemoglobin 14.9 12.0 - 15.0 g/dL   HCT 44.9 36.0 - 46.0 %   MCV 83.1 80.0 - 100.0 fL   MCH 27.6 26.0 - 34.0 pg   MCHC 33.2 30.0 - 36.0 g/dL   RDW 13.6 11.5 - 15.5 %   Platelets 267 150 - 400 K/uL   nRBC 0.0 0.0 - 0.2 %    Comment: Performed at [facility], [address] [place], [place] [address]  Basic metabolic panel     Status: Abnormal   Collection Time: 12/03/17  4:47 PM  Result Value Ref Range   Sodium 138 135 - 145 mmol/L   Potassium 3.4 (L) 3.5 - 5.1 mmol/L   Chloride 101 98 - 111 mmol/L   CO2 24 22 - 32 mmol/L   Glucose, Bld 118 (H) 70 - 99 mg/dL   BUN 18 8 - 23 mg/dL   Creatinine, Ser 1.22 (H) 0.44 - 1.00 mg/dL   Calcium 9.1 8.9 - 10.3 mg/dL   GFR calc non Af Amer 44 (L) >60 mL/min  GFR calc Af Amer 52 (L) >60 mL/min    Comment: (NOTE) The eGFR has been calculated using the CKD EPI equation. This calculation has not been validated in all clinical situations. eGFR's persistently <60 mL/min signify possible Chronic Kidney Disease.    Anion gap 13 5 - 15    Comment: Performed at [facility] [address] [place], [place] [address]  Sedimentation rate     Status: None   Collection Time: 12/03/17  5:24 PM  Result Value Ref Range   Sed Rate 6 0 - 30 mm/hr    Comment: Performed at [facility], [address] [place], [place] [address]   Dg Chest 2 View  Result Date: 12/03/2017 CLINICAL DATA:  Dry cough and headache with weakness x4 days. EXAM: CHEST - 2 VIEW COMPARISON:  06/23/2011 FINDINGS: The heart size and mediastinal contours are within normal limits. Both lungs are clear. The visualized skeletal structures are unremarkable. IMPRESSION: No active cardiopulmonary disease. Electronically Signed   By: Ashley Royalty M.D.   On: 12/03/2017 18:18   Ct Head Wo Contrast  Result Date:  12/03/2017 CLINICAL DATA:  Acute severe headache EXAM: CT HEAD WITHOUT CONTRAST TECHNIQUE: Contiguous axial images were obtained from the base of the skull through the vertex without intravenous contrast. COMPARISON:  01/26/2008 FINDINGS: BRAIN: The ventricles and sulci are normal. Chronic minimal small vessel ischemic disease of periventricular white matter. No intraparenchymal hemorrhage, mass effect nor midline shift. No acute large vascular territory infarcts. Grey-white matter distinction is maintained. Tiny chronic left basal ganglial lacunar infarcts. No abnormal extra-axial fluid collections. Basal cisterns are not effaced and midline. The brainstem and cerebellar hemispheres are without acute abnormalities. VASCULAR: Unremarkable. SKULL/SOFT TISSUES: No skull fracture. No significant soft tissue swelling. ORBITS/SINUSES: The included ocular globes and orbital contents are normal.The mastoid air cells are clear. The included paranasal sinuses are well-aerated. OTHER: None. IMPRESSION: Chronic minimal small vessel ischemic disease and left basal ganglial lacunar infarcts. No acute intracranial abnormality. Electronically Signed   By: Ashley Royalty M.D.   On: 12/03/2017 17:12    Pending Labs [employer] (From admission, onward)    Start     Ordered   Signed and Held  HIV antibody (Routine Testing)  Once,   R     Signed and Held   Signed and Held  Lipid panel  Tomorrow morning,   R    Comments:  Fasting    Signed and Held   Signed and Held  CBC  (enoxaparin (LOVENOX)    CrCl >/= 30 ml/min)  Once,   R    Comments:  Baseline for enoxaparin therapy IF NOT ALREADY DRAWN.  Notify MD if PLT < 100 K.    Signed and Held   Signed and Held  Creatinine, serum  (enoxaparin (LOVENOX)    CrCl >/= 30 ml/min)  Once,   R    Comments:  Baseline for enoxaparin therapy IF NOT ALREADY DRAWN.    Signed and Held   Signed and Held  Creatinine, serum  (enoxaparin (LOVENOX)    CrCl >/= 30 ml/min)  Weekly,   R     Comments:  while on enoxaparin therapy    Signed and Held          Vitals/Pain Today's Vitals   12/03/17 1733 12/03/17 1803 12/03/17 1845 12/03/17 1917  BP:    140/66  Pulse:   87 90  Resp:      Temp:  TempSrc:      SpO2:   96% 98%  Weight:      Height:      PainSc: 8  8       Isolation Precautions No active isolations  Medications Medications  ondansetron (ZOFRAN) injection 4 mg (4 mg Intravenous Given 12/03/17 1802)  acetaminophen (TYLENOL) tablet 650 mg (650 mg Oral Given 12/03/17 1801)    Mobility walks

## 2017-12-03 NOTE — ED Notes (Signed)
Previous nurse spoke with MRI and they verified a 22 gauge IV will be sufficient.

## 2017-12-04 ENCOUNTER — Observation Stay (HOSPITAL_BASED_OUTPATIENT_CLINIC_OR_DEPARTMENT_OTHER)
Admit: 2017-12-04 | Discharge: 2017-12-04 | Disposition: A | Discharge status: Home / Self Care | Payer: Medicare Other | Attending: Internal Medicine | Admitting: Internal Medicine

## 2017-12-04 DIAGNOSIS — R51 Headache: Secondary | ICD-10-CM | POA: Diagnosis not present

## 2017-12-04 DIAGNOSIS — G459 Transient cerebral ischemic attack, unspecified: Secondary | ICD-10-CM | POA: Diagnosis not present

## 2017-12-04 LAB — ECHOCARDIOGRAM COMPLETE
Height: 62 in
Weight: 3056 oz

## 2017-12-04 LAB — LIPID PANEL
Cholesterol: 164 mg/dL (ref 0–200)
HDL: 46 mg/dL (ref >40)
LDL CALC: 75 mg/dL (ref 0–99)
Total CHOL/HDL Ratio: 3.6 RATIO
Triglycerides: 215 mg/dL — ABNORMAL HIGH (ref <150)
VLDL: 43 mg/dL — AB (ref 0–40)

## 2017-12-04 MED ORDER — OXYCODONE HCL 5 MG PO TABS
5.0000 mg | ORAL_TABLET | ORAL | Status: DC | PRN
  Filled 2017-12-04: qty 1

## 2017-12-04 MED ORDER — FENOFIBRATE 160 MG PO TABS: 160.0000 mg | ORAL_TABLET | Freq: Every day | ORAL | 0 refills | Status: DC

## 2017-12-04 NOTE — Evaluation (Signed)
Physical Therapy Evaluation Patient Details Name: Brittney Ray MRN: [id number] DOB: June 15, 1950 Today's Date: 12/04/2017   History of Present Illness  67 yo female with onset of suspected TIA and has dizziness and light headed feelings, was referred to PT for mobility ck.  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  Has no equpment at home so are requesting RW and HHPT for follow up.  Will increase her intervention for acute therapy to work on the strength and balance prior to dc as long as needed.    Follow Up Recommendations Home health PT;Supervision for mobility/OOB    Equipment Recommendations  Rolling walker with 5" wheels    Recommendations for Other Services       Precautions / Restrictions Precautions Precautions: Fall(telemetry) Restrictions Weight Bearing Restrictions: No      Mobility  Bed Mobility Overal bed mobility: Needs Assistance Bed Mobility: Supine to Sit     Supine to sit: Min assist     General bed mobility comments: minor support to trunk to lift OOB  Transfers Overall transfer level: Needs assistance Equipment used: Rolling walker (2 wheeled);1 person hand held assist Transfers: Sit to/from Stand Sit to Stand: Min guard         General transfer comment: min guard for safety  Ambulation/Gait Ambulation/Gait assistance: Min guard Gait Distance (Feet): 80 Feet Assistive device: Rolling walker (2 wheeled);1 person hand held assist Gait Pattern/deviations: Step-through pattern;Decreased stride length;Wide base of support Gait velocity: reduced Gait velocity interpretation: <1.8 ft/sec, indicate of risk for recurrent falls General Gait Details: pt is moving carefully as her head is dizzy and feels she is turning to the L when she is still  Tree surgeon [place] (Stroke Patients Only)       Balance Overall balance assessment: Needs assistance Sitting-balance support: Feet supported Sitting balance-Leahy Scale: Good     Standing balance support: Bilateral upper extremity supported;During functional activity Standing balance-Leahy Scale: Fair                               Pertinent Vitals/Pain Pain Assessment: No/denies pain    Home Living Family/patient expects to be discharged to:: Private residence Living Arrangements: Spouse/significant other Available Help at Discharge: Family;Available 24 hours/day Type of Home: House Home Access: Stairs to enter Entrance Stairs-Rails: Psychiatric nurse of Steps: 2 Home Layout: One level Home Equipment: None      Prior Function Level of Independence: Independent               Hand Dominance        Extremity/Trunk Assessment   Upper Extremity Assessment Upper Extremity Assessment: Overall WFL for tasks assessed    Lower Extremity Assessment Lower Extremity Assessment: Generalized weakness(B hips are 4- abd, and 4 adduction)    Cervical / Trunk Assessment Cervical / Trunk Assessment: Normal  Communication   Communication: No difficulties  Cognition Arousal/Alertness: Awake/alert Behavior During Therapy: WFL for tasks assessed/performed Overall Cognitive Status: Within Functional Limits for tasks assessed  General Comments      Exercises     Assessment/Plan    PT Assessment Patient needs continued PT services  PT Problem List Decreased strength;Decreased range of motion;Decreased activity tolerance;Decreased balance;Decreased mobility;Decreased coordination;Decreased knowledge of use of DME;Decreased safety awareness;Obesity       PT Treatment Interventions DME instruction;Gait training;Stair training;Functional mobility training;Therapeutic activities;Therapeutic  exercise;Balance training;Neuromuscular re-education;Patient/family education    PT Goals (Current goals can be found in the Care Plan section)  Acute Rehab PT Goals Patient Stated Goal: to get home with assist from husband PT Goal Formulation: With patient Time For Goal Achievement: 12/11/17 Potential to Achieve Goals: Good    Frequency Min 2X/week   Barriers to discharge   has only 2 steps to house and will need RW    Co-evaluation               AM-PAC PT "6 Clicks" Mobility  Outcome Measure Help needed turning from your back to your side while in a flat bed without using bedrails?: A Little Help needed moving from lying on your back to sitting on the side of a flat bed without using bedrails?: A Little Help needed moving to and from a bed to a chair (including a wheelchair)?: A Little Help needed standing up from a chair using your arms (e.g., wheelchair or bedside chair)?: A Little Help needed to walk in hospital room?: A Little Help needed climbing 3-5 steps with a railing? : A Little 6 Click Score: 18    End of Session Equipment Utilized During Treatment: Gait belt Activity Tolerance: Patient tolerated treatment well;Treatment limited secondary to medical complications (Comment)(dizziness) Patient left: in bed;with call bell/phone within reach;with bed alarm set Nurse Communication: Mobility status PT Visit Diagnosis: Unsteadiness on feet (R26.81);Muscle weakness (generalized) (M62.81)    Time: 3893-7342 PT Time Calculation (min) (ACUTE ONLY): 27 min   Charges:   PT Evaluation $PT Eval Moderate Complexity: 1 Mod PT Treatments $Gait Training: 8-22 mins       Brittney Ray 12/04/2017, 12:28 PM   Brittney Ray, PT MS Acute Rehab Dept. Number: [place] [facility] [place]

## 2017-12-04 NOTE — Care Management Note (Signed)
Case Management Note  Patient Details  Name: Brittney Ray MRN: [id number] Date of Birth: 10-17-1950  Subjective/Objective:   Patient to be discharged per MD order. Orders in place for home health services. Patient agreeable to home health. Referral to [facility],  [place] has accepted the patient for PT care. Patient requires no DME. Family to transport. No further RNCM needs                  Action/Plan:   Expected Discharge Date:  12/04/17               Expected Discharge Plan:  [place]  In-House Referral:     Discharge planning Services  CM Consult  Post Acute Care Choice:  Home Health Choice offered to:  Patient  DME Arranged:    DME Agency:     HH Arranged:  PT HH Agency:  [facility]  Status of Service:  Completed, signed off  If discussed at [place] of Stay Meetings, dates discussed:    Additional Comments:  Latanya Maudlin, RN 12/04/2017, 3:42 PM

## 2017-12-04 NOTE — Discharge Summary (Signed)
[facility] [place] NAME: Brittney Ray    MR#:  [id number]  DATE OF BIRTH:  Jul 09, 1950  DATE OF ADMISSION:  12/03/2017 ADMITTING PHYSICIAN: Saundra Shelling, MD  DATE OF DISCHARGE: No discharge date for patient encounter.  PRIMARY CARE PHYSICIAN: Dion Body, MD    ADMISSION DIAGNOSIS:  Headache x4 Days  DISCHARGE DIAGNOSIS:  Active Problems:   TIA (transient ischemic attack)   SECONDARY DIAGNOSIS:   Past Medical History:  Diagnosis Date  . Anxiety    panic attacks; no curren med.  . Arthritis    knees  . Breast cancer ([facility]) 2014   RT LUMPECTOMY  . Diabetes mellitus    NIDDM  . GERD (gastroesophageal reflux disease)   . H/O hiatal hernia   . Heart murmur    states never had any problems  . Hypertension    under control, has been on med. > 10 yrs.  . Hypothyroidism   . Medial meniscus tear 04/2011   right knee  . Nocturia    3-4 x/night  . Paget's bone disease    hips  . Personal history of radiation therapy 2014   right BREAST CA  . PONV (postoperative nausea and vomiting)     HOSPITAL COURSE:   67 year old female patient with history of hypertension, type 2 diabetes mellitus, Paget's disease of bone, breast cancer, basal ganglia infarct presented to the emergency room for blurry vision and numbness in the hands  -Transient ischemic attack Resolved Admitted to regular nursing for bed on our stroke protocol, MRI of the brain was negative for any acute process, patient treated with continued dual antiplatelet therapy of aspirin/Plavix Patient follow-up with neurology status post discharge for continued care/reevaluation in 1 to 2 weeks  -Type 2 diabetes mellitus Controlled on current regiment  -Hypertension Controlled on current regiment  DISCHARGE CONDITIONS:   stable  CONSULTS OBTAINED:  Treatment Team:  Alexis Goodell, MD  DRUG ALLERGIES:   Allergies  Allergen Reactions  .  Contrast Media [Iodinated Diagnostic Agents] Hives and Cough  . Codeine Anxiety  . Morphine And Related Other (See Comments)    Tremors  . Sulfa Antibiotics Nausea And Vomiting    DISCHARGE MEDICATIONS:   Allergies as of 12/04/2017      Reactions   Contrast Media [iodinated Diagnostic Agents] Hives, Cough   Codeine Anxiety   Morphine And Related Other (See Comments)   Tremors   Sulfa Antibiotics Nausea And Vomiting      Medication List    TAKE these medications   aspirin 325 MG tablet Take 325 mg by mouth daily.   CALCIUM 600/VITAMIN D3 600-800 MG-UNIT Tabs Generic drug:  Calcium Carb-Cholecalciferol Take 2 tablets by mouth daily with breakfast.   cholecalciferol 25 MCG (1000 UT) tablet Commonly known as:  VITAMIN D3 Take 1,000 Units by mouth daily.   clopidogrel 75 MG tablet Commonly known as:  PLAVIX Take 75 mg by mouth daily.   diazepam 5 MG tablet Commonly known as:  VALIUM Take 1 tablet by mouth prior to procedure.   fenofibrate 160 MG tablet Take 1 tablet (160 mg total) by mouth daily. Start taking on:  12/05/2017 What changed:    how much to take  how to take this  when to take this   ferrous sulfate 325 (65 FE) MG tablet Take 325 mg by mouth daily.   glimepiride 1 MG tablet Commonly known as:  AMARYL Take 1 mg by mouth at  bedtime.   levothyroxine 50 MCG tablet Commonly known as:  SYNTHROID, LEVOTHROID Take 50 mcg by mouth daily.   lisinopril-hydrochlorothiazide 10-12.5 MG tablet Commonly known as:  PRINZIDE,ZESTORETIC Take 1 tablet by mouth daily.   metFORMIN 500 MG tablet Commonly known as:  GLUCOPHAGE Take 1,000 mg by mouth 2 (two) times daily.   metoprolol tartrate 25 MG tablet Commonly known as:  LOPRESSOR Take 12.5 mg by mouth 2 (two) times daily.   nitroGLYCERIN 0.4 MG SL tablet Commonly known as:  NITROSTAT Place 0.4 mg under the tongue every 5 (five) minutes as needed for chest pain (up to 3 doses).   pantoprazole 40 MG  tablet Commonly known as:  PROTONIX Take 40 mg by mouth daily.   pravastatin 20 MG tablet Commonly known as:  PRAVACHOL Take 20 mg by mouth daily.        DISCHARGE INSTRUCTIONS:      If you experience worsening of your admission symptoms, develop shortness of breath, life threatening emergency, suicidal or homicidal thoughts you must seek medical attention immediately by calling 911 or calling your MD immediately  if symptoms less severe.  You Must read complete instructions/literature along with all the possible adverse reactions/side effects for all the Medicines you take and that have been prescribed to you. Take any new Medicines after you have completely understood and accept all the possible adverse reactions/side effects.   Please note  You were cared for by a hospitalist during your hospital stay. If you have any questions about your discharge medications or the care you received while you were in the hospital after you are discharged, you can call the unit and asked to speak with the hospitalist on call if the hospitalist that took care of you is not available. Once you are discharged, your primary care physician will handle any further medical issues. Please note that NO REFILLS for any discharge medications will be authorized once you are discharged, as it is imperative that you return to your primary care physician (or establish a relationship with a primary care physician if you do not have one) for your aftercare needs so that they can reassess your need for medications and monitor your lab values.    Today   CHIEF COMPLAINT:   Chief Complaint  Patient presents with  . Migraine    HISTORY OF PRESENT ILLNESS:  66 y.o. female with a known history of diabetes mellitus type 2, breast cancer, hypothyroidism, hypertension, Paget's disease of bone, basal ganglia infarcts presented to the emergency room headache as well as blurry vision.  Patient also complains of numbness  in the hands and both upper extremities as well as had some slurred speech.  Symptoms started around 10 AM this morning.  Patient was worked up with CT head which showed old basal ganglia infarcts but no acute abnormality.  Patient is due for MRI and MRA brain.  Hospitalist service was consulted for further care.  Patient is on aspirin Plavix at home.   VITAL SIGNS:  Blood pressure (!) 154/76, pulse 77, temperature 98.1 F (36.7 C), temperature source Oral, resp. rate (!) 24, height 5\' 2"  (1.575 m), weight 86.6 kg, SpO2 98 %.  I/O:    Intake/Output Summary (Last 24 hours) at 12/04/2017 1025 Last data filed at 12/04/2017 1004 Gross per 24 hour  Intake 360 ml  Output -  Net 360 ml    PHYSICAL EXAMINATION:  GENERAL:  67 y.o.-year-old patient lying in the bed with no acute distress.  EYES: Pupils equal, round, reactive to light and accommodation. No scleral icterus. Extraocular muscles intact.  HEENT: Head atraumatic, normocephalic. Oropharynx and nasopharynx clear.  NECK:  Supple, no jugular venous distention. No thyroid enlargement, no tenderness.  LUNGS: Normal breath sounds bilaterally, no wheezing, rales,rhonchi or crepitation. No use of accessory muscles of respiration.  CARDIOVASCULAR: S1, S2 normal. No murmurs, rubs, or gallops.  ABDOMEN: Soft, non-tender, non-distended. Bowel sounds present. No organomegaly or mass.  EXTREMITIES: No pedal edema, cyanosis, or clubbing.  NEUROLOGIC: Cranial nerves II through XII are intact. Muscle strength 5/5 in all extremities. Sensation intact. Gait not checked.  PSYCHIATRIC: The patient is alert and oriented x 3.  SKIN: No obvious rash, lesion, or ulcer.   DATA REVIEW:   CBC Recent Labs  Lab 12/03/17 2210  WBC 11.0*  HGB 13.2  HCT 39.5  PLT 229    Chemistries  Recent Labs  Lab 12/03/17 1647 12/03/17 2210  NA 138  --   K 3.4*  --   CL 101  --   CO2 24  --   GLUCOSE 118*  --   BUN 18  --   CREATININE 1.22* 1.13*  CALCIUM  9.1  --     Cardiac Enzymes No results for input(s): TROPONINI in the last 168 hours.  Microbiology Results  No results found for this or any previous visit.  RADIOLOGY:  Dg Chest 2 View  Result Date: 12/03/2017 CLINICAL DATA:  Dry cough and headache with weakness x4 days. EXAM: CHEST - 2 VIEW COMPARISON:  06/23/2011 FINDINGS: The heart size and mediastinal contours are within normal limits. Both lungs are clear. The visualized skeletal structures are unremarkable. IMPRESSION: No active cardiopulmonary disease. Electronically Signed   By: Ashley Royalty M.D.   On: 12/03/2017 18:18   Ct Head Wo Contrast  Result Date: 12/03/2017 CLINICAL DATA:  Acute severe headache EXAM: CT HEAD WITHOUT CONTRAST TECHNIQUE: Contiguous axial images were obtained from the base of the skull through the vertex without intravenous contrast. COMPARISON:  01/26/2008 FINDINGS: BRAIN: The ventricles and sulci are normal. Chronic minimal small vessel ischemic disease of periventricular white matter. No intraparenchymal hemorrhage, mass effect nor midline shift. No acute large vascular territory infarcts. Grey-white matter distinction is maintained. Tiny chronic left basal ganglial lacunar infarcts. No abnormal extra-axial fluid collections. Basal cisterns are not effaced and midline. The brainstem and cerebellar hemispheres are without acute abnormalities. VASCULAR: Unremarkable. SKULL/SOFT TISSUES: No skull fracture. No significant soft tissue swelling. ORBITS/SINUSES: The included ocular globes and orbital contents are normal.The mastoid air cells are clear. The included paranasal sinuses are well-aerated. OTHER: None. IMPRESSION: Chronic minimal small vessel ischemic disease and left basal ganglial lacunar infarcts. No acute intracranial abnormality. Electronically Signed   By: Ashley Royalty M.D.   On: 12/03/2017 17:12   Mr Jodene Nam Head Wo Contrast  Result Date: 12/03/2017 CLINICAL DATA:  Initial evaluation for acute left-sided  visual loss. EXAM: MRI HEAD WITHOUT CONTRAST MRA HEAD WITHOUT CONTRAST MRA NECK WITHOUT AND WITH CONTRAST TECHNIQUE: Multiplanar, multiecho pulse sequences of the brain and surrounding structures were obtained without intravenous contrast. Angiographic images of the Circle of Willis were obtained using MRA technique without intravenous contrast. Angiographic images of the neck were obtained using MRA technique without and with intravenous contrast. Carotid stenosis measurements (when applicable) are obtained utilizing NASCET criteria, using the distal internal carotid diameter as the denominator. CONTRAST:  9 cc of Gadavist. COMPARISON:  Prior CT from earlier the same day. FINDINGS: MRI HEAD FINDINGS  Cerebral volume within normal limits for age. Mild chronic microvascular ischemic changes present within the periventricular and deep white matter both cerebral hemispheres. No abnormal foci of restricted diffusion to suggest acute or subacute ischemia. No encephalomalacia to suggest chronic infarction. No foci of susceptibility artifact to suggest acute or chronic intracranial hemorrhage. No mass lesion, midline shift or mass effect. No hydrocephalus. No extra-axial fluid collection. Normal pituitary gland. Major intracranial vascular flow voids are maintained. Craniocervical junction within normal limits. Bone marrow signal intensity normal. Hyperostosis frontalis interna noted. Scalp soft tissues unremarkable. Globes and orbital soft tissues within normal limits. Paranasal sinuses are clear. No mastoid effusion. Inner ear structures normal. MRA HEAD FINDINGS ANTERIOR CIRCULATION: Distal cervical segments of the internal carotid arteries are widely patent with symmetric antegrade flow. Petrous segments patent bilaterally. Diffuse atheromatous irregularity throughout the cavernous/supraclinoid ICAs without high-grade stenosis. ICA termini widely patent. A1 segments mildly irregular but patent bilaterally. Anterior  cerebral arteries patent to their distal aspects without stenosis. M1 segments widely patent without hemodynamically significant stenosis. Normal MCA bifurcations. No proximal M2 occlusion. Distal MCA branches well perfused and symmetric. Distal small vessel atheromatous irregularity noted within the MCA branches bilaterally. POSTERIOR CIRCULATION: Dominant left vertebral artery patent to the vertebrobasilar junction without stenosis. Short-segment severe distal right V4 stenosis just beyond the takeoff of the right PICA (series 1050, image 12). Posterior inferior cerebral arteries patent proximally. Basilar patent to its distal aspect without hemodynamically significant stenosis. Superior cerebral arteries patent bilaterally. Left PCA supplied largely via the basilar, although a small left posterior communicating artery noted as well. Hypoplastic right P1 segment with predominant fetal type origin of the right PCA. Short-segment severe distal right P2 stenosis (series 1050, image 4). Distal small vessel atheromatous irregularity seen within the PCAs bilaterally. No intracranial aneurysm. MRA NECK FINDINGS Visualized aortic arch of normal caliber with normal branch pattern. No flow-limiting stenosis seen about the origin of the great vessels. Visualized subclavian arteries widely patent. Both common carotid arteries widely patent from their origins to the bifurcations without stenosis. No hemodynamically significant stenosis or significant atheromatous irregularity seen about the carotid bifurcations bilaterally. Internal carotid arteries widely patent from the bifurcations to the skull base without stenosis or occlusion. Both vertebral arteries arise from the subclavian arteries. Left vertebral artery dominant. Vertebral arteries widely patent within the neck without stenosis or occlusion. IMPRESSION: MRI HEAD IMPRESSION: 1. No acute intracranial infarct or other abnormality identified. 2. Age-related cerebral  atrophy with mild chronic small vessel ischemic disease. MRA HEAD IMPRESSION: 1. Negative intracranial MRA for large vessel occlusion. 2. Short-segment severe distal right P2 stenosis. 3. Short-segment severe distal right V4 stenosis. Dominant left vertebral artery widely patent to the vertebrobasilar junction. No other hemodynamically significant or correctable stenosis identified within the intracranial circulation. 4. Distal small vessel atheromatous irregularity. MRA NECK IMPRESSION: 1. Negative MRA of the neck. No hemodynamically significant stenosis seen involving either carotid artery system. 2. Vertebral arteries widely patent within the neck. Left vertebral artery dominant. Electronically Signed   By: Jeannine Boga M.D.   On: 12/03/2017 22:16   Mr Angiogram Neck W Or Wo Contrast  Result Date: 12/03/2017 CLINICAL DATA:  Initial evaluation for acute left-sided visual loss. EXAM: MRI HEAD WITHOUT CONTRAST MRA HEAD WITHOUT CONTRAST MRA NECK WITHOUT AND WITH CONTRAST TECHNIQUE: Multiplanar, multiecho pulse sequences of the brain and surrounding structures were obtained without intravenous contrast. Angiographic images of the Circle of Willis were obtained using MRA technique without intravenous contrast. Angiographic images of the  neck were obtained using MRA technique without and with intravenous contrast. Carotid stenosis measurements (when applicable) are obtained utilizing NASCET criteria, using the distal internal carotid diameter as the denominator. CONTRAST:  9 cc of Gadavist. COMPARISON:  Prior CT from earlier the same day. FINDINGS: MRI HEAD FINDINGS Cerebral volume within normal limits for age. Mild chronic microvascular ischemic changes present within the periventricular and deep white matter both cerebral hemispheres. No abnormal foci of restricted diffusion to suggest acute or subacute ischemia. No encephalomalacia to suggest chronic infarction. No foci of susceptibility artifact to  suggest acute or chronic intracranial hemorrhage. No mass lesion, midline shift or mass effect. No hydrocephalus. No extra-axial fluid collection. Normal pituitary gland. Major intracranial vascular flow voids are maintained. Craniocervical junction within normal limits. Bone marrow signal intensity normal. Hyperostosis frontalis interna noted. Scalp soft tissues unremarkable. Globes and orbital soft tissues within normal limits. Paranasal sinuses are clear. No mastoid effusion. Inner ear structures normal. MRA HEAD FINDINGS ANTERIOR CIRCULATION: Distal cervical segments of the internal carotid arteries are widely patent with symmetric antegrade flow. Petrous segments patent bilaterally. Diffuse atheromatous irregularity throughout the cavernous/supraclinoid ICAs without high-grade stenosis. ICA termini widely patent. A1 segments mildly irregular but patent bilaterally. Anterior cerebral arteries patent to their distal aspects without stenosis. M1 segments widely patent without hemodynamically significant stenosis. Normal MCA bifurcations. No proximal M2 occlusion. Distal MCA branches well perfused and symmetric. Distal small vessel atheromatous irregularity noted within the MCA branches bilaterally. POSTERIOR CIRCULATION: Dominant left vertebral artery patent to the vertebrobasilar junction without stenosis. Short-segment severe distal right V4 stenosis just beyond the takeoff of the right PICA (series 1050, image 12). Posterior inferior cerebral arteries patent proximally. Basilar patent to its distal aspect without hemodynamically significant stenosis. Superior cerebral arteries patent bilaterally. Left PCA supplied largely via the basilar, although a small left posterior communicating artery noted as well. Hypoplastic right P1 segment with predominant fetal type origin of the right PCA. Short-segment severe distal right P2 stenosis (series 1050, image 4). Distal small vessel atheromatous irregularity seen within  the PCAs bilaterally. No intracranial aneurysm. MRA NECK FINDINGS Visualized aortic arch of normal caliber with normal branch pattern. No flow-limiting stenosis seen about the origin of the great vessels. Visualized subclavian arteries widely patent. Both common carotid arteries widely patent from their origins to the bifurcations without stenosis. No hemodynamically significant stenosis or significant atheromatous irregularity seen about the carotid bifurcations bilaterally. Internal carotid arteries widely patent from the bifurcations to the skull base without stenosis or occlusion. Both vertebral arteries arise from the subclavian arteries. Left vertebral artery dominant. Vertebral arteries widely patent within the neck without stenosis or occlusion. IMPRESSION: MRI HEAD IMPRESSION: 1. No acute intracranial infarct or other abnormality identified. 2. Age-related cerebral atrophy with mild chronic small vessel ischemic disease. MRA HEAD IMPRESSION: 1. Negative intracranial MRA for large vessel occlusion. 2. Short-segment severe distal right P2 stenosis. 3. Short-segment severe distal right V4 stenosis. Dominant left vertebral artery widely patent to the vertebrobasilar junction. No other hemodynamically significant or correctable stenosis identified within the intracranial circulation. 4. Distal small vessel atheromatous irregularity. MRA NECK IMPRESSION: 1. Negative MRA of the neck. No hemodynamically significant stenosis seen involving either carotid artery system. 2. Vertebral arteries widely patent within the neck. Left vertebral artery dominant. Electronically Signed   By: Jeannine Boga M.D.   On: 12/03/2017 22:16   Mr Brain Wo Contrast  Result Date: 12/03/2017 CLINICAL DATA:  Initial evaluation for acute left-sided visual loss. EXAM: MRI HEAD WITHOUT  CONTRAST MRA HEAD WITHOUT CONTRAST MRA NECK WITHOUT AND WITH CONTRAST TECHNIQUE: Multiplanar, multiecho pulse sequences of the brain and surrounding  structures were obtained without intravenous contrast. Angiographic images of the Circle of Willis were obtained using MRA technique without intravenous contrast. Angiographic images of the neck were obtained using MRA technique without and with intravenous contrast. Carotid stenosis measurements (when applicable) are obtained utilizing NASCET criteria, using the distal internal carotid diameter as the denominator. CONTRAST:  9 cc of Gadavist. COMPARISON:  Prior CT from earlier the same day. FINDINGS: MRI HEAD FINDINGS Cerebral volume within normal limits for age. Mild chronic microvascular ischemic changes present within the periventricular and deep white matter both cerebral hemispheres. No abnormal foci of restricted diffusion to suggest acute or subacute ischemia. No encephalomalacia to suggest chronic infarction. No foci of susceptibility artifact to suggest acute or chronic intracranial hemorrhage. No mass lesion, midline shift or mass effect. No hydrocephalus. No extra-axial fluid collection. Normal pituitary gland. Major intracranial vascular flow voids are maintained. Craniocervical junction within normal limits. Bone marrow signal intensity normal. Hyperostosis frontalis interna noted. Scalp soft tissues unremarkable. Globes and orbital soft tissues within normal limits. Paranasal sinuses are clear. No mastoid effusion. Inner ear structures normal. MRA HEAD FINDINGS ANTERIOR CIRCULATION: Distal cervical segments of the internal carotid arteries are widely patent with symmetric antegrade flow. Petrous segments patent bilaterally. Diffuse atheromatous irregularity throughout the cavernous/supraclinoid ICAs without high-grade stenosis. ICA termini widely patent. A1 segments mildly irregular but patent bilaterally. Anterior cerebral arteries patent to their distal aspects without stenosis. M1 segments widely patent without hemodynamically significant stenosis. Normal MCA bifurcations. No proximal M2 occlusion.  Distal MCA branches well perfused and symmetric. Distal small vessel atheromatous irregularity noted within the MCA branches bilaterally. POSTERIOR CIRCULATION: Dominant left vertebral artery patent to the vertebrobasilar junction without stenosis. Short-segment severe distal right V4 stenosis just beyond the takeoff of the right PICA (series 1050, image 12). Posterior inferior cerebral arteries patent proximally. Basilar patent to its distal aspect without hemodynamically significant stenosis. Superior cerebral arteries patent bilaterally. Left PCA supplied largely via the basilar, although a small left posterior communicating artery noted as well. Hypoplastic right P1 segment with predominant fetal type origin of the right PCA. Short-segment severe distal right P2 stenosis (series 1050, image 4). Distal small vessel atheromatous irregularity seen within the PCAs bilaterally. No intracranial aneurysm. MRA NECK FINDINGS Visualized aortic arch of normal caliber with normal branch pattern. No flow-limiting stenosis seen about the origin of the great vessels. Visualized subclavian arteries widely patent. Both common carotid arteries widely patent from their origins to the bifurcations without stenosis. No hemodynamically significant stenosis or significant atheromatous irregularity seen about the carotid bifurcations bilaterally. Internal carotid arteries widely patent from the bifurcations to the skull base without stenosis or occlusion. Both vertebral arteries arise from the subclavian arteries. Left vertebral artery dominant. Vertebral arteries widely patent within the neck without stenosis or occlusion. IMPRESSION: MRI HEAD IMPRESSION: 1. No acute intracranial infarct or other abnormality identified. 2. Age-related cerebral atrophy with mild chronic small vessel ischemic disease. MRA HEAD IMPRESSION: 1. Negative intracranial MRA for large vessel occlusion. 2. Short-segment severe distal right P2 stenosis. 3.  Short-segment severe distal right V4 stenosis. Dominant left vertebral artery widely patent to the vertebrobasilar junction. No other hemodynamically significant or correctable stenosis identified within the intracranial circulation. 4. Distal small vessel atheromatous irregularity. MRA NECK IMPRESSION: 1. Negative MRA of the neck. No hemodynamically significant stenosis seen involving either carotid artery system. 2. Vertebral arteries  widely patent within the neck. Left vertebral artery dominant. Electronically Signed   By: Jeannine Boga M.D.   On: 12/03/2017 22:16    EKG:   Orders placed or performed in visit on 10/19/14  . EKG      Management plans discussed with the patient, family and they are in agreement.  CODE STATUS:     Code Status Orders  (From admission, onward)         Start     Ordered   12/03/17 2144  Full code  Continuous     12/03/17 2143        Code Status History    This patient has a current code status but no historical code status.      TOTAL TIME TAKING CARE OF THIS PATIENT: 40 minutes.    Avel Peace Ludwig Tugwell M.D on 12/04/2017 at 10:25 AM  Between 7am to 6pm - Pager - [phone]  After 6pm go to [email or web address] - password [email or web address] [facility]  Office  [phone]  CC: Primary care physician; Dion Body, MD   Note: This dictation was prepared with Dragon dictation along with smaller phrase technology. Any transcriptional errors that result from this process are unintentional.

## 2017-12-04 NOTE — Evaluation (Signed)
SLP Cancellation Note  Patient Details Name: Brittney Ray MRN: [id number] DOB: 1950-08-05   Cancelled treatment:       Reason Eval/Treat Not Completed: SLP screened, no needs identified, will sign off  Pt notified to contact slp with changes in status. Pt stated that she has not had any difficulty with swallowing and had brief difficulty with speech yesterday, however has cleared up. SLP notified pt to contact nsg for slp if decreased speech ability occurs again.   [place]ali Sauber 12/04/2017, 9:35 AM

## 2017-12-04 NOTE — Evaluation (Signed)
Occupational Therapy Evaluation Patient Details Name: Brittney Ray MRN: [id number] DOB: 03/04/1950 Today's Date: 12/04/2017    History of Present Illness 67 yo female with onset of suspected TIA and has dizziness and light headed feelings, was referred to PT for mobility ck.  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 Husband at bedside. Patient was pleasant and willing to work with OT. Patient states she has been having symptoms intermittently over the past 4 days. States all of the symptoms resolved within 10-15 minutes each time. No slurred speech, numbness, or vision loss noted at this time. Patient states she still has a bit of a headache and is sensitive to lights. Patient able to perform bed mobility using bed rails. OT assessed BUE strength, with no difference noted between Left and Right. Sensation testing performed with no difference noted, but patient stated right hand felt different "spongy feeling". Patient denies vision issues at this time, but states it was difficulty seeing things on the right, even if she blocked and self-tested each eye. Vision screening performed by OT with no issues noted. Patient is at baseline at this time, and does not need OT services. Patient and husband were educated on notifying nursing if any of the previous symptoms occurred again, and that OT could reassess if patient's status changes. Patient and husband verbalized understanding.    Follow Up Recommendations  No OT follow up    Equipment Recommendations       Recommendations for Other Services       Precautions / Restrictions Precautions Precautions: Fall Restrictions Weight Bearing Restrictions: No      Mobility Bed Mobility Overal bed mobility: Modified Independent Bed Mobility: Supine to Sit     Supine to sit: Modified independent (Device/Increase time)     General bed  mobility comments: Used bed rails and extra time due to headache (states movement increases). No physical assist provided.  Transfers Overall transfer level: Needs assistance Equipment used: Rolling walker (2 wheeled);1 person hand held assist Transfers: Sit to/from Stand Sit to Stand: Min guard         General transfer comment: min guard for safety    Balance Overall balance assessment: Needs assistance Sitting-balance support: Feet supported Sitting balance-Leahy Scale: Good     Standing balance support: Bilateral upper extremity supported;During functional activity Standing balance-Leahy Scale: Fair                             ADL either performed or assessed with clinical judgement   ADL Overall ADL's : At baseline                                             Vision Baseline Vision/History: Wears glasses Wears Glasses: At all times Patient Visual Report: Other (comment)(Patient reports she had loss of vision on the right side of both eyes, but it seems to have resolved.) Vision Assessment?: Yes Eye Alignment: Within Functional Limits Ocular Range of Motion: Within Functional Limits Alignment/Gaze Preference: Within Defined Limits Tracking/Visual Pursuits: Able to track stimulus in all quads without difficulty Saccades: Within functional limits Visual Fields: No apparent deficits;Other (comment)(No peripheral deficits noted when eyes tested together and individually.)  Perception     Praxis      Pertinent Vitals/Pain Pain Assessment: No/denies pain     Hand Dominance Right   Extremity/Trunk Assessment Upper Extremity Assessment Upper Extremity Assessment: (BUE AROM 5/5 and similar on both sides.) RUE Sensation: WNL(Patient states RUE numbness occured yesterday for about 10-15 minutes and then resolved. No difference in touch noted during testing, but patient stated that right seemed a bit different than left. Described it as  "spongy".)   Lower Extremity Assessment Lower Extremity Assessment: Defer to PT evaluation   Cervical / Trunk Assessment Cervical / Trunk Assessment: Normal   Communication Communication Communication: No difficulties   Cognition Arousal/Alertness: Awake/alert Behavior During Therapy: WFL for tasks assessed/performed Overall Cognitive Status: Within Functional Limits for tasks assessed                                     General Comments       Exercises Exercises: Other exercises(B hips 4- to 4, R knee flex 3+ WFL otherwise)   Shoulder Instructions      Home Living Family/patient expects to be discharged to:: Private residence Living Arrangements: Spouse/significant other Available Help at Discharge: Family;Available 24 hours/day Type of Home: House Home Access: Stairs to enter [employer] of Steps: 2 Entrance Stairs-Rails: Right;Left Home Layout: One level         Biochemist, clinical: Standard     Home Equipment: None          Prior Functioning/Environment Level of Independence: Independent                 OT Problem List:        OT Treatment/Interventions:      OT Goals(Current goals can be found in the care plan section) Acute Rehab OT Goals Patient Stated Goal: to get home with assist from husband OT Goal Formulation: With patient Time For Goal Achievement: 12/11/17 Potential to Achieve Goals: Good  OT Frequency:     Barriers to D/C:            Co-evaluation              AM-PAC OT "6 Clicks" Daily Activity     Outcome Measure Help from another person eating meals?: None Help from another person taking care of personal grooming?: None Help from another person toileting, which includes using toliet, bedpan, or urinal?: None Help from another person bathing (including washing, rinsing, drying)?: None Help from another person to put on and taking off regular upper body clothing?: None Help from another person  to put on and taking off regular lower body clothing?: None 6 Click Score: 24   End of Session    Activity Tolerance: Patient tolerated treatment well Patient left: in bed;with family/visitor present  OT Visit Diagnosis: Other symptoms and signs involving the nervous system (E28.003)                Time: 4917-9150 OT Time Calculation (min): 22 min Charges:  OT General Charges $OT Visit: 1 Visit OT Evaluation $OT Eval Low Complexity: 1 Low  Amie Portland, OTR/L  Kerrilynn Derenzo L 12/04/2017, 1:34 PM

## 2017-12-04 NOTE — Plan of Care (Signed)
Pt report her eyes are sensitive to light and her right leg is weak at baseline. Pt report poor appetite. No other signs of distress noted. Will continue to monitor.

## 2017-12-04 NOTE — Consult Note (Addendum)
Referring Physician: Salary, M    Chief Complaint: Headache and blurry vision  HPI: Brittney Ray is an 67 y.o. female with past medical history of anxiety, breast cancer, diabetes mellitus, heart murmur, hypertension, GERD, and high both thyroidism presenting to the ED on 12/03/2017 with complaints of intermittent left sided headache x4 days and blurry vision x3 days.  Patient states that she has been to ophthalmologist for left eye vision loss and was told that she had a "spot" in her eye. Patient describes episode as painless loss of vision in the left eye without associated vertigo/dizziness. No symptoms of eye redness or pain and tearing associated with visual loss (intermittent angle closure glaucoma). Patient states nothing seem to precipitate episode such as postural changes or exercise, loss of vision when eyes are moved into certain positions of gaze (gaze-evoked amaurosis). She reports onset of worsening headache symptoms since 12/03/2017 and has progressively increased in frequency, intensity and severity. The headache is intermittent, moderate to severe in intensity, located predominantly in her bifrontal and temporal region.  She describes pain as throbbing, pressure and dull aching. Headache is associated with nausea, light and noise sensitivity, irritability and difficulty with concentration. Symptoms worsen with physical activity and improves with rest or sleeping in a dark quite room. Denies associated altered sensorium, cranial nerve deficit, seizures, focal motor  deficits, diplopia, or vomiting, or syncope. She however describes associated symptoms of slurred speech, some numbness in hand and mouth which has resolved.  Patient denies head trauma or history of migraine headaches.  Initial NIH stroke scale 2. CT head did not show acute intracranial abnormality.  Chronic minimal small vessel ischemic disease and left basal ganglia lacunar infarcts noted.  Follow-up MRI of the brain not  show acute intracranial infarct or other abnormality.  MRA head and neck was negative for hemodynamically significant stenosis, large vessel occlusion or aneurysm.  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  She was therefore admitted for further work-up and management.  Date last known well: Unable to determine Time last known well: Unable to determine tPA Given: No: Onset of symptoms more than 3 days ago  Past Medical History:  Diagnosis Date  . Anxiety    panic attacks; no curren med.  . Arthritis    knees  . Breast cancer ([place]) 2014   RT LUMPECTOMY  . Diabetes mellitus    NIDDM  . GERD (gastroesophageal reflux disease)   . H/O hiatal hernia   . Heart murmur    states never had any problems  . Hypertension    under control, has been on med. > 10 yrs.  . Hypothyroidism   . Medial meniscus tear 04/2011   right knee  . Nocturia    3-4 x/night  . Paget's bone disease    hips  . Personal history of radiation therapy 2014   right BREAST CA  . PONV (postoperative nausea and vomiting)     Past Surgical History:  Procedure Laterality Date  . ABDOMINAL HYSTERECTOMY  1978   partial  . BACK SURGERY  04/1990, 1996  . BREAST BIOPSY Right 03/14/2012   2 areas UOQ of DCIS  . BREAST CYST ASPIRATION Left   . BREAST LUMPECTOMY Right 03/29/2012   right breast DCIS of two areas. Clear margins  . [place] PROCEDURE  09/1989   with rectal repair  . CHOLECYSTECTOMY  11/2005  . FOOT SURGERY  1992   left  . HAND SURGERY  10/1999  nerve repair right thumb  . KNEE SURGERY  07/31/2009   right  . KNEE SURGERY  2009   left  . THYROID LOBECTOMY  03/1992   left    Family History  Problem Relation Age of Onset  . Breast cancer Sister 52  . Breast cancer Cousin    Social History:  reports that she has never smoked. She has never used smokeless tobacco. She reports that she does not drink alcohol or use drugs.  Allergies:  Allergies  Allergen Reactions  .  Contrast Media [Iodinated Diagnostic Agents] Hives and Cough  . Codeine Anxiety  . Morphine And Related Other (See Comments)    Tremors  . Sulfa Antibiotics Nausea And Vomiting    Medications:  I have reviewed the patient's current medications. Prior to Admission:  Medications Prior to Admission  Medication Sig Dispense Refill Last Dose  . aspirin 325 MG tablet Take 325 mg by mouth daily.    12/02/2017 at 0800  . Calcium Carb-Cholecalciferol (CALCIUM 600/VITAMIN D3) 600-800 MG-UNIT TABS Take 2 tablets by mouth daily with breakfast.    12/02/2017 at 0800  . cholecalciferol (VITAMIN D3) 25 MCG (1000 UT) tablet Take 1,000 Units by mouth daily.   12/02/2017 at Unknown time  . clopidogrel (PLAVIX) 75 MG tablet Take 75 mg by mouth daily.    12/02/2017 at Unknown time  . ferrous sulfate 325 (65 FE) MG tablet Take 325 mg by mouth daily.    12/02/2017 at 0800  . glimepiride (AMARYL) 1 MG tablet Take 1 mg by mouth at bedtime.    12/02/2017 at 2000  . levothyroxine (SYNTHROID, LEVOTHROID) 50 MCG tablet Take 50 mcg by mouth daily.    12/02/2017 at 0700  . lisinopril-hydrochlorothiazide (PRINZIDE,ZESTORETIC) 10-12.5 MG tablet Take 1 tablet by mouth daily.    12/02/2017 at 0800  . metFORMIN (GLUCOPHAGE) 500 MG tablet Take 1,000 mg by mouth 2 (two) times daily.    12/02/2017 at 1700  . metoprolol tartrate (LOPRESSOR) 25 MG tablet Take 12.5 mg by mouth 2 (two) times daily.    12/02/2017 at 1800  . nitroGLYCERIN (NITROSTAT) 0.4 MG SL tablet Place 0.4 mg under the tongue every 5 (five) minutes as needed for chest pain (up to 3 doses).    Unknown at PRN  . pantoprazole (PROTONIX) 40 MG tablet Take 40 mg by mouth daily.    12/02/2017 at Unknown time  . pravastatin (PRAVACHOL) 20 MG tablet Take 20 mg by mouth daily.    12/02/2017 at 2000  . diazepam (VALIUM) 5 MG tablet Take 1 tablet by mouth prior to procedure. (Patient not taking: Reported on 12/03/2017) 1 tablet 0 Not Taking at Unknown time   Scheduled: .  aspirin  300 mg Rectal Daily   Or  . aspirin EC  325 mg Oral Daily  . clopidogrel  75 mg Oral Daily  . enoxaparin (LOVENOX) injection  40 mg Subcutaneous Q24H  . fenofibrate  160 mg Oral Daily  . ferrous sulfate  325 mg Oral Q breakfast  . levothyroxine  50 mcg Oral Q0600  . pantoprazole  40 mg Oral Daily  . pravastatin  20 mg Oral Daily    ROS: History obtained from the patient   General ROS: negative for - chills, fatigue, fever, night sweats, weight gain or weight loss Psychological ROS: negative for - behavioral disorder, hallucinations, memory difficulties, mood swings or suicidal ideation Ophthalmic ROS: negative for - blurry vision, double vision, eye pain or loss of vision ENT  ROS: negative for - epistaxis, nasal discharge, oral lesions, sore throat, tinnitus or vertigo Allergy and Immunology ROS: negative for - hives or itchy/watery eyes Hematological and Lymphatic ROS: negative for - bleeding problems, bruising or swollen lymph nodes Endocrine ROS: negative for - galactorrhea, hair pattern changes, polydipsia/polyuria or temperature intolerance Respiratory ROS: negative for - cough, hemoptysis, shortness of breath or wheezing Cardiovascular ROS: negative for - chest pain, dyspnea on exertion, edema or irregular heartbeat Gastrointestinal ROS: negative for - abdominal pain, diarrhea, hematemesis, nausea/vomiting or stool incontinence Genito-Urinary ROS: negative for - dysuria, hematuria, incontinence or urinary frequency/urgency Musculoskeletal ROS: negative for - joint swelling or muscular weakness Neurological ROS: as noted in HPI Dermatological ROS: negative for rash and skin lesion changes  Physical Examination: Blood pressure (!) 154/76, pulse 77, temperature 98.1 F (36.7 C), temperature source Oral, resp. rate (!) 24, height 5\' 2"  (1.575 m), weight 86.6 kg, SpO2 98 %.   HEENT-  Normocephalic, no lesions, without obvious abnormality.  Normal external eye and  conjunctiva.  Normal TM's bilaterally.  Normal auditory canals and external ears. Normal external nose, mucus membranes and septum.  Normal pharynx. Cardiovascular- S1, S2 normal, pulses palpable throughout   Lungs- chest clear, no wheezing, rales, normal symmetric air entry Abdomen- soft, non-tender; bowel sounds normal; no masses,  no organomegaly Extremities- no edema Lymph-no adenopathy palpable Musculoskeletal-no joint tenderness, deformity or swelling Skin-warm and dry, no hyperpigmentation, vitiligo, or suspicious lesions  Neurological Exam   Mental Status: Alert, oriented, thought content appropriate.  Speech fluent without evidence of aphasia.  Able to follow 3 step commands without difficulty. Attention span and concentration seemed appropriate  Cranial Nerves: II: Discs flat bilaterally; Visual fields grossly normal, pupils equal, round, reactive to light and accommodation III,IV, VI: ptosis not present, extra-ocular motions intact bilaterally V,VII: smile symmetric, facial light touch sensation intact VIII: hearing normal bilaterally IX,X: gag reflex present XI: bilateral shoulder shrug XII: midline tongue extension Motor: Right :  Upper extremity   5/5 Without pronator drift      Left: Upper extremity   5/5 without pronator drift Right:   Lower extremity   5/5                                          Left: Lower extremity   5/5 Tone and bulk:normal tone throughout; no atrophy noted Sensory: Pinprick and light touch intact bilaterally Deep Tendon Reflexes: 2+ and symmetric throughout Plantars: Right: mute                              Left: mute Cerebellar: Finger-to-nose testing intact bilaterally. Heel to shin testing normal bilaterally Gait: not tested due to safety concerns  Data Reviewed  Laboratory Studies:  Basic Metabolic Panel: Recent Labs  Lab 12/03/17 1647 12/03/17 2210  NA 138  --   K 3.4*  --   CL 101  --   CO2 24  --   GLUCOSE 118*  --   BUN 18   --   CREATININE 1.22* 1.13*  CALCIUM 9.1  --     Liver Function Tests: No results for input(s): AST, ALT, ALKPHOS, BILITOT, PROT, ALBUMIN in the last 168 hours. No results for input(s): LIPASE, AMYLASE in the last 168 hours. No results for input(s): AMMONIA in the last 168 hours.  CBC: Recent Labs  Lab 12/03/17 1647 12/03/17 2210  WBC 13.2* 11.0*  HGB 14.9 13.2  HCT 44.9 39.5  MCV 83.1 82.8  PLT 267 229    Cardiac Enzymes: No results for input(s): CKTOTAL, CKMB, CKMBINDEX, TROPONINI in the last 168 hours.  BNP: Invalid input(s): POCBNP  CBG: No results for input(s): GLUCAP in the last 168 hours.  Microbiology: No results found for this or any previous visit.  Coagulation Studies: No results for input(s): LABPROT, INR in the last 72 hours.  Urinalysis: No results for input(s): COLORURINE, LABSPEC, PHURINE, GLUCOSEU, HGBUR, BILIRUBINUR, KETONESUR, PROTEINUR, UROBILINOGEN, NITRITE, LEUKOCYTESUR in the last 168 hours.  Invalid input(s): APPERANCEUR  Lipid Panel:    Component Value Date/Time   CHOL 164 12/04/2017 0357   CHOL 196 06/24/2011 0401   TRIG 215 (H) 12/04/2017 0357   TRIG 214 (H) 06/24/2011 0401   HDL 46 12/04/2017 0357   HDL 54 06/24/2011 0401   CHOLHDL 3.6 12/04/2017 0357   VLDL 43 (H) 12/04/2017 0357   VLDL 43 (H) 06/24/2011 0401   LDLCALC 75 12/04/2017 0357   LDLCALC 99 06/24/2011 0401    HgbA1C:  Lab Results  Component Value Date   HGBA1C 6.8 (H) 10/10/2013    Urine Drug Screen:  No results found for: LABOPIA, COCAINSCRNUR, LABBENZ, AMPHETMU, THCU, LABBARB  Alcohol Level: No results for input(s): ETH in the last 168 hours.  Other results: EKG: there are no previous tracings available for comparison.  Imaging: Dg Chest 2 View  Result Date: 12/03/2017 CLINICAL DATA:  Dry cough and headache with weakness x4 days. EXAM: CHEST - 2 VIEW COMPARISON:  06/23/2011 FINDINGS: The heart size and mediastinal contours are within normal limits. Both  lungs are clear. The visualized skeletal structures are unremarkable. IMPRESSION: No active cardiopulmonary disease. Electronically Signed   By: Ashley Royalty M.D.   On: 12/03/2017 18:18   Ct Head Wo Contrast  Result Date: 12/03/2017 CLINICAL DATA:  Acute severe headache EXAM: CT HEAD WITHOUT CONTRAST TECHNIQUE: Contiguous axial images were obtained from the base of the skull through the vertex without intravenous contrast. COMPARISON:  01/26/2008 FINDINGS: BRAIN: The ventricles and sulci are normal. Chronic minimal small vessel ischemic disease of periventricular white matter. No intraparenchymal hemorrhage, mass effect nor midline shift. No acute large vascular territory infarcts. Grey-white matter distinction is maintained. Tiny chronic left basal ganglial lacunar infarcts. No abnormal extra-axial fluid collections. Basal cisterns are not effaced and midline. The brainstem and cerebellar hemispheres are without acute abnormalities. VASCULAR: Unremarkable. SKULL/SOFT TISSUES: No skull fracture. No significant soft tissue swelling. ORBITS/SINUSES: The included ocular globes and orbital contents are normal.The mastoid air cells are clear. The included paranasal sinuses are well-aerated. OTHER: None. IMPRESSION: Chronic minimal small vessel ischemic disease and left basal ganglial lacunar infarcts. No acute intracranial abnormality. Electronically Signed   By: Ashley Royalty M.D.   On: 12/03/2017 17:12   Mr Jodene Nam Head Wo Contrast  Result Date: 12/03/2017 CLINICAL DATA:  Initial evaluation for acute left-sided visual loss. EXAM: MRI HEAD WITHOUT CONTRAST MRA HEAD WITHOUT CONTRAST MRA NECK WITHOUT AND WITH CONTRAST TECHNIQUE: Multiplanar, multiecho pulse sequences of the brain and surrounding structures were obtained without intravenous contrast. Angiographic images of the Circle of Willis were obtained using MRA technique without intravenous contrast. Angiographic images of the neck were obtained using MRA  technique without and with intravenous contrast. Carotid stenosis measurements (when applicable) are obtained utilizing NASCET criteria, using the distal internal carotid diameter as the denominator. CONTRAST:  9 cc of Gadavist.  COMPARISON:  Prior CT from earlier the same day. FINDINGS: MRI HEAD FINDINGS Cerebral volume within normal limits for age. Mild chronic microvascular ischemic changes present within the periventricular and deep white matter both cerebral hemispheres. No abnormal foci of restricted diffusion to suggest acute or subacute ischemia. No encephalomalacia to suggest chronic infarction. No foci of susceptibility artifact to suggest acute or chronic intracranial hemorrhage. No mass lesion, midline shift or mass effect. No hydrocephalus. No extra-axial fluid collection. Normal pituitary gland. Major intracranial vascular flow voids are maintained. Craniocervical junction within normal limits. Bone marrow signal intensity normal. Hyperostosis frontalis interna noted. Scalp soft tissues unremarkable. Globes and orbital soft tissues within normal limits. Paranasal sinuses are clear. No mastoid effusion. Inner ear structures normal. MRA HEAD FINDINGS ANTERIOR CIRCULATION: Distal cervical segments of the internal carotid arteries are widely patent with symmetric antegrade flow. Petrous segments patent bilaterally. Diffuse atheromatous irregularity throughout the cavernous/supraclinoid ICAs without high-grade stenosis. ICA termini widely patent. A1 segments mildly irregular but patent bilaterally. Anterior cerebral arteries patent to their distal aspects without stenosis. M1 segments widely patent without hemodynamically significant stenosis. Normal MCA bifurcations. No proximal M2 occlusion. Distal MCA branches well perfused and symmetric. Distal small vessel atheromatous irregularity noted within the MCA branches bilaterally. POSTERIOR CIRCULATION: Dominant left vertebral artery patent to the  vertebrobasilar junction without stenosis. Short-segment severe distal right V4 stenosis just beyond the takeoff of the right PICA (series 1050, image 12). Posterior inferior cerebral arteries patent proximally. Basilar patent to its distal aspect without hemodynamically significant stenosis. Superior cerebral arteries patent bilaterally. Left PCA supplied largely via the basilar, although a small left posterior communicating artery noted as well. Hypoplastic right P1 segment with predominant fetal type origin of the right PCA. Short-segment severe distal right P2 stenosis (series 1050, image 4). Distal small vessel atheromatous irregularity seen within the PCAs bilaterally. No intracranial aneurysm. MRA NECK FINDINGS Visualized aortic arch of normal caliber with normal branch pattern. No flow-limiting stenosis seen about the origin of the great vessels. Visualized subclavian arteries widely patent. Both common carotid arteries widely patent from their origins to the bifurcations without stenosis. No hemodynamically significant stenosis or significant atheromatous irregularity seen about the carotid bifurcations bilaterally. Internal carotid arteries widely patent from the bifurcations to the skull base without stenosis or occlusion. Both vertebral arteries arise from the subclavian arteries. Left vertebral artery dominant. Vertebral arteries widely patent within the neck without stenosis or occlusion. IMPRESSION: MRI HEAD IMPRESSION: 1. No acute intracranial infarct or other abnormality identified. 2. Age-related cerebral atrophy with mild chronic small vessel ischemic disease. MRA HEAD IMPRESSION: 1. Negative intracranial MRA for large vessel occlusion. 2. Short-segment severe distal right P2 stenosis. 3. Short-segment severe distal right V4 stenosis. Dominant left vertebral artery widely patent to the vertebrobasilar junction. No other hemodynamically significant or correctable stenosis identified within the  intracranial circulation. 4. Distal small vessel atheromatous irregularity. MRA NECK IMPRESSION: 1. Negative MRA of the neck. No hemodynamically significant stenosis seen involving either carotid artery system. 2. Vertebral arteries widely patent within the neck. Left vertebral artery dominant. Electronically Signed   By: Jeannine Boga M.D.   On: 12/03/2017 22:16   Mr Angiogram Neck W Or Wo Contrast  Result Date: 12/03/2017 CLINICAL DATA:  Initial evaluation for acute left-sided visual loss. EXAM: MRI HEAD WITHOUT CONTRAST MRA HEAD WITHOUT CONTRAST MRA NECK WITHOUT AND WITH CONTRAST TECHNIQUE: Multiplanar, multiecho pulse sequences of the brain and surrounding structures were obtained without intravenous contrast. Angiographic images of the Circle of  Willis were obtained using MRA technique without intravenous contrast. Angiographic images of the neck were obtained using MRA technique without and with intravenous contrast. Carotid stenosis measurements (when applicable) are obtained utilizing NASCET criteria, using the distal internal carotid diameter as the denominator. CONTRAST:  9 cc of Gadavist. COMPARISON:  Prior CT from earlier the same day. FINDINGS: MRI HEAD FINDINGS Cerebral volume within normal limits for age. Mild chronic microvascular ischemic changes present within the periventricular and deep white matter both cerebral hemispheres. No abnormal foci of restricted diffusion to suggest acute or subacute ischemia. No encephalomalacia to suggest chronic infarction. No foci of susceptibility artifact to suggest acute or chronic intracranial hemorrhage. No mass lesion, midline shift or mass effect. No hydrocephalus. No extra-axial fluid collection. Normal pituitary gland. Major intracranial vascular flow voids are maintained. Craniocervical junction within normal limits. Bone marrow signal intensity normal. Hyperostosis frontalis interna noted. Scalp soft tissues unremarkable. Globes and orbital  soft tissues within normal limits. Paranasal sinuses are clear. No mastoid effusion. Inner ear structures normal. MRA HEAD FINDINGS ANTERIOR CIRCULATION: Distal cervical segments of the internal carotid arteries are widely patent with symmetric antegrade flow. Petrous segments patent bilaterally. Diffuse atheromatous irregularity throughout the cavernous/supraclinoid ICAs without high-grade stenosis. ICA termini widely patent. A1 segments mildly irregular but patent bilaterally. Anterior cerebral arteries patent to their distal aspects without stenosis. M1 segments widely patent without hemodynamically significant stenosis. Normal MCA bifurcations. No proximal M2 occlusion. Distal MCA branches well perfused and symmetric. Distal small vessel atheromatous irregularity noted within the MCA branches bilaterally. POSTERIOR CIRCULATION: Dominant left vertebral artery patent to the vertebrobasilar junction without stenosis. Short-segment severe distal right V4 stenosis just beyond the takeoff of the right PICA (series 1050, image 12). Posterior inferior cerebral arteries patent proximally. Basilar patent to its distal aspect without hemodynamically significant stenosis. Superior cerebral arteries patent bilaterally. Left PCA supplied largely via the basilar, although a small left posterior communicating artery noted as well. Hypoplastic right P1 segment with predominant fetal type origin of the right PCA. Short-segment severe distal right P2 stenosis (series 1050, image 4). Distal small vessel atheromatous irregularity seen within the PCAs bilaterally. No intracranial aneurysm. MRA NECK FINDINGS Visualized aortic arch of normal caliber with normal branch pattern. No flow-limiting stenosis seen about the origin of the great vessels. Visualized subclavian arteries widely patent. Both common carotid arteries widely patent from their origins to the bifurcations without stenosis. No hemodynamically significant stenosis or  significant atheromatous irregularity seen about the carotid bifurcations bilaterally. Internal carotid arteries widely patent from the bifurcations to the skull base without stenosis or occlusion. Both vertebral arteries arise from the subclavian arteries. Left vertebral artery dominant. Vertebral arteries widely patent within the neck without stenosis or occlusion. IMPRESSION: MRI HEAD IMPRESSION: 1. No acute intracranial infarct or other abnormality identified. 2. Age-related cerebral atrophy with mild chronic small vessel ischemic disease. MRA HEAD IMPRESSION: 1. Negative intracranial MRA for large vessel occlusion. 2. Short-segment severe distal right P2 stenosis. 3. Short-segment severe distal right V4 stenosis. Dominant left vertebral artery widely patent to the vertebrobasilar junction. No other hemodynamically significant or correctable stenosis identified within the intracranial circulation. 4. Distal small vessel atheromatous irregularity. MRA NECK IMPRESSION: 1. Negative MRA of the neck. No hemodynamically significant stenosis seen involving either carotid artery system. 2. Vertebral arteries widely patent within the neck. Left vertebral artery dominant. Electronically Signed   By: Jeannine Boga M.D.   On: 12/03/2017 22:16   Mr Brain Wo Contrast  Result Date: 12/03/2017 CLINICAL  DATA:  Initial evaluation for acute left-sided visual loss. EXAM: MRI HEAD WITHOUT CONTRAST MRA HEAD WITHOUT CONTRAST MRA NECK WITHOUT AND WITH CONTRAST TECHNIQUE: Multiplanar, multiecho pulse sequences of the brain and surrounding structures were obtained without intravenous contrast. Angiographic images of the Circle of Willis were obtained using MRA technique without intravenous contrast. Angiographic images of the neck were obtained using MRA technique without and with intravenous contrast. Carotid stenosis measurements (when applicable) are obtained utilizing NASCET criteria, using the distal internal carotid  diameter as the denominator. CONTRAST:  9 cc of Gadavist. COMPARISON:  Prior CT from earlier the same day. FINDINGS: MRI HEAD FINDINGS Cerebral volume within normal limits for age. Mild chronic microvascular ischemic changes present within the periventricular and deep white matter both cerebral hemispheres. No abnormal foci of restricted diffusion to suggest acute or subacute ischemia. No encephalomalacia to suggest chronic infarction. No foci of susceptibility artifact to suggest acute or chronic intracranial hemorrhage. No mass lesion, midline shift or mass effect. No hydrocephalus. No extra-axial fluid collection. Normal pituitary gland. Major intracranial vascular flow voids are maintained. Craniocervical junction within normal limits. Bone marrow signal intensity normal. Hyperostosis frontalis interna noted. Scalp soft tissues unremarkable. Globes and orbital soft tissues within normal limits. Paranasal sinuses are clear. No mastoid effusion. Inner ear structures normal. MRA HEAD FINDINGS ANTERIOR CIRCULATION: Distal cervical segments of the internal carotid arteries are widely patent with symmetric antegrade flow. Petrous segments patent bilaterally. Diffuse atheromatous irregularity throughout the cavernous/supraclinoid ICAs without high-grade stenosis. ICA termini widely patent. A1 segments mildly irregular but patent bilaterally. Anterior cerebral arteries patent to their distal aspects without stenosis. M1 segments widely patent without hemodynamically significant stenosis. Normal MCA bifurcations. No proximal M2 occlusion. Distal MCA branches well perfused and symmetric. Distal small vessel atheromatous irregularity noted within the MCA branches bilaterally. POSTERIOR CIRCULATION: Dominant left vertebral artery patent to the vertebrobasilar junction without stenosis. Short-segment severe distal right V4 stenosis just beyond the takeoff of the right PICA (series 1050, image 12). Posterior inferior cerebral  arteries patent proximally. Basilar patent to its distal aspect without hemodynamically significant stenosis. Superior cerebral arteries patent bilaterally. Left PCA supplied largely via the basilar, although a small left posterior communicating artery noted as well. Hypoplastic right P1 segment with predominant fetal type origin of the right PCA. Short-segment severe distal right P2 stenosis (series 1050, image 4). Distal small vessel atheromatous irregularity seen within the PCAs bilaterally. No intracranial aneurysm. MRA NECK FINDINGS Visualized aortic arch of normal caliber with normal branch pattern. No flow-limiting stenosis seen about the origin of the great vessels. Visualized subclavian arteries widely patent. Both common carotid arteries widely patent from their origins to the bifurcations without stenosis. No hemodynamically significant stenosis or significant atheromatous irregularity seen about the carotid bifurcations bilaterally. Internal carotid arteries widely patent from the bifurcations to the skull base without stenosis or occlusion. Both vertebral arteries arise from the subclavian arteries. Left vertebral artery dominant. Vertebral arteries widely patent within the neck without stenosis or occlusion. IMPRESSION: MRI HEAD IMPRESSION: 1. No acute intracranial infarct or other abnormality identified. 2. Age-related cerebral atrophy with mild chronic small vessel ischemic disease. MRA HEAD IMPRESSION: 1. Negative intracranial MRA for large vessel occlusion. 2. Short-segment severe distal right P2 stenosis. 3. Short-segment severe distal right V4 stenosis. Dominant left vertebral artery widely patent to the vertebrobasilar junction. No other hemodynamically significant or correctable stenosis identified within the intracranial circulation. 4. Distal small vessel atheromatous irregularity. MRA NECK IMPRESSION: 1. Negative MRA of the neck. No  hemodynamically significant stenosis seen involving either  carotid artery system. 2. Vertebral arteries widely patent within the neck. Left vertebral artery dominant. Electronically Signed   By: Jeannine Boga M.D.   On: 12/03/2017 22:16   Assessment: 67 y.o. female  with past medical history of anxiety, breast cancer, diabetes mellitus, heart murmur, hypertension, GERD, and high both thyroidism presenting to the ED on 12/03/2017 with complaints of intermittent left sided headache x4 days and blurry vision x3 days.  No focal neurologic deficit.  Unclear etiology of symptoms.  MRI of the brain did not show acute intracranial infarct or other abnormality.  MRA head and neck was negative for hemodynamically significant stenosis, large vessel occlusion or aneurysm.  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  LDL 75.  Patient states that she was on aspirin 325 mg and Plavix 75 mg prior to this event.  Stroke Risk Factors - carotid stenosis, diabetes mellitus, hyperlipidemia and hypertension  Plan: 1.  Continue home dual therapy Aspirin 81 mg/day and Plavix 75 mg /day with intensive management of vascular risk factor to keep systolic BP (SBP) <119 mm Hg (130 mm Hg if diabetic) 2. Statin with goal low density lipoprotein (LDL) <70 mg/dl 3.  Echocardiogram pending 4.  Recommend follow-up with outpatient ophthalmologist  This patient was staffed with Dr. Irish Elders, Alease Frame who personally evaluated patient, reviewed documentation and agreed with assessment and plan of care as above.  Rufina Falco, DNP, FNP-BC Board certified Nurse Practitioner Neurology Department   12/04/2017, 12:47 PM

## 2017-12-05 LAB — HIV ANTIBODY (ROUTINE TESTING W REFLEX): HIV Screen 4th Generation wRfx: NONREACTIVE

## 2017-12-08 ENCOUNTER — Other Ambulatory Visit: Payer: Self-pay

## 2017-12-08 ENCOUNTER — Emergency Department
Admission: EM | Admit: 2017-12-08 | Discharge: 2017-12-09 | Disposition: A | Discharge status: Home / Self Care | Payer: Medicare Other | Attending: Emergency Medicine | Admitting: Emergency Medicine

## 2017-12-08 DIAGNOSIS — Z7984 Long term (current) use of oral hypoglycemic drugs: Secondary | ICD-10-CM | POA: Diagnosis not present

## 2017-12-08 DIAGNOSIS — E119 Type 2 diabetes mellitus without complications: Secondary | ICD-10-CM | POA: Insufficient documentation

## 2017-12-08 DIAGNOSIS — R51 Headache: Secondary | ICD-10-CM | POA: Diagnosis not present

## 2017-12-08 DIAGNOSIS — I2510 Atherosclerotic heart disease of native coronary artery without angina pectoris: Secondary | ICD-10-CM | POA: Insufficient documentation

## 2017-12-08 DIAGNOSIS — E039 Hypothyroidism, unspecified: Secondary | ICD-10-CM | POA: Insufficient documentation

## 2017-12-08 DIAGNOSIS — Z79899 Other long term (current) drug therapy: Secondary | ICD-10-CM | POA: Diagnosis not present

## 2017-12-08 DIAGNOSIS — Z7902 Long term (current) use of antithrombotics/antiplatelets: Secondary | ICD-10-CM | POA: Diagnosis not present

## 2017-12-08 DIAGNOSIS — I10 Essential (primary) hypertension: Secondary | ICD-10-CM | POA: Insufficient documentation

## 2017-12-08 DIAGNOSIS — Z853 Personal history of malignant neoplasm of breast: Secondary | ICD-10-CM | POA: Insufficient documentation

## 2017-12-08 DIAGNOSIS — Z7982 Long term (current) use of aspirin: Secondary | ICD-10-CM | POA: Diagnosis not present

## 2017-12-08 DIAGNOSIS — R519 Headache, unspecified: Secondary | ICD-10-CM

## 2017-12-08 LAB — COMPREHENSIVE METABOLIC PANEL
ALK PHOS: 45 U/L (ref 38–126)
ALT: 36 U/L (ref 0–44)
ANION GAP: 14 (ref 5–15)
AST: 30 U/L (ref 15–41)
Albumin: 4.2 g/dL (ref 3.5–5.0)
BUN: 24 mg/dL — ABNORMAL HIGH (ref 8–23)
CALCIUM: 9.3 mg/dL (ref 8.9–10.3)
CO2: 23 mmol/L (ref 22–32)
CREATININE: 1.02 mg/dL — AB (ref 0.44–1.00)
Chloride: 102 mmol/L (ref 98–111)
GFR calc non Af Amer: 56 mL/min — ABNORMAL LOW (ref >60)
Glucose, Bld: 86 mg/dL (ref 70–99)
Potassium: 3.9 mmol/L (ref 3.5–5.1)
Sodium: 139 mmol/L (ref 135–145)
TOTAL PROTEIN: 7.1 g/dL (ref 6.5–8.1)
Total Bilirubin: 0.5 mg/dL (ref 0.3–1.2)

## 2017-12-08 LAB — CBC
HCT: 42 % (ref 36.0–46.0)
HEMOGLOBIN: 13.9 g/dL (ref 12.0–15.0)
MCH: 27.4 pg (ref 26.0–34.0)
MCHC: 33.1 g/dL (ref 30.0–36.0)
MCV: 82.7 fL (ref 80.0–100.0)
Platelets: 277 10*3/uL (ref 150–400)
RBC: 5.08 MIL/uL (ref 3.87–5.11)
RDW: 13.6 % (ref 11.5–15.5)
WBC: 12.3 10*3/uL — ABNORMAL HIGH (ref 4.0–10.5)
nRBC: 0 % (ref 0.0–0.2)

## 2017-12-08 LAB — SEDIMENTATION RATE: Sed Rate: 7 mm/hr (ref 0–30)

## 2017-12-08 MED ORDER — SODIUM CHLORIDE 0.9 % IV BOLUS
1000.0000 mL | Freq: Once | INTRAVENOUS | Status: AC
  Administered 2017-12-08: 1000 mL via INTRAVENOUS

## 2017-12-08 MED ORDER — BUTALBITAL-APAP-CAFFEINE 50-325-40 MG PO TABS
1.0000 | ORAL_TABLET | Freq: Once | ORAL | Status: AC
  Administered 2017-12-08: 1 via ORAL
  Filled 2017-12-08: qty 1

## 2017-12-08 MED ORDER — BUTALBITAL-APAP-CAFFEINE 50-325-40 MG PO TABS: 1.0000 | ORAL_TABLET | Freq: Four times a day (QID) | ORAL | 0 refills | Status: AC | PRN

## 2017-12-08 MED ORDER — PROCHLORPERAZINE EDISYLATE 10 MG/2ML IJ SOLN
10.0000 mg | Freq: Once | INTRAMUSCULAR | Status: AC
  Administered 2017-12-08: 10 mg via INTRAVENOUS
  Filled 2017-12-08: qty 2

## 2017-12-08 NOTE — ED Notes (Signed)
Pt c/o headache and nausea that has been ongoing for over a week. Pt stated that the headache is worse at night time. Pt was recently admitted for same and all her test were normal.

## 2017-12-08 NOTE — Discharge Instructions (Signed)
Please seek medical attention for any high fevers, chest pain, shortness of breath, change in behavior, persistent vomiting, bloody stool or any other new or concerning symptoms.  

## 2017-12-08 NOTE — ED Provider Notes (Signed)
[facility] Emergency Department Provider Note   ____________________________________________   I have reviewed the triage vital signs and the nursing notes.   HISTORY  Chief Complaint Headache   History limited by: Not Limited   HPI Brittney Ray is a 67 y.o. female who presents to the emergency department today because of concerns for headache.  Patient describes as being located in the front and left part of her head.  Patient has had these headaches for the past 10 days.  They are typically worse at night.  Had a recent hospitalization for similar symptoms and had a work-up including MRI without any obvious etiology of the pain.  Patient has some nausea with this.  She has also had a dry cough.  No fevers.  Per medical record review patient has a history of anxiety, recent admission with work up for tia/headache  Past Medical History:  Diagnosis Date  . Anxiety    panic attacks; no curren med.  . Arthritis    knees  . Breast cancer ([place]) 2014   RT LUMPECTOMY  . Diabetes mellitus    NIDDM  . GERD (gastroesophageal reflux disease)   . H/O hiatal hernia   . Heart murmur    states never had any problems  . Hypertension    under control, has been on med. > 10 yrs.  . Hypothyroidism   . Medial meniscus tear 04/2011   right knee  . Nocturia    3-4 x/night  . Paget's bone disease    hips  . Personal history of radiation therapy 2014   right BREAST CA  . PONV (postoperative nausea and vomiting)     Patient Active Problem List   Diagnosis Date Noted  . TIA (transient ischemic attack) 12/03/2017  . Primary osteoarthritis of both hands 08/30/2017  . Trochanteric bursitis of both hips 08/30/2017  . DDD (degenerative disc disease), lumbar 08/30/2017  . H/O Paget's disease of bone 08/30/2017  . Osteopenia of neck of right femur 03/04/2017  . Primary osteoarthritis of both knees 01/10/2016  . Chronic pain of both knees 01/10/2016  . Obesity  (BMI 30-39.9) 03/25/2015  . Multinodular goiter 03/04/2015  . Vaccine counseling 10/24/2014  . Postprocedural hypothyroidism 10/08/2014  . Type 2 diabetes mellitus without complication, without long-term current use of insulin ([place]) 10/08/2014  . Breast cancer in situ 08/30/2014  . Coronary artery disease 08/17/2013  . NSTEMI (non-ST elevated myocardial infarction) ([place]) 08/17/2013  . Acquired hypothyroidism 02/13/2013  . Diabetes mellitus type 2, uncomplicated ([place]) 23/53/6144  . Essential hypertension 02/13/2013  . Mixed hyperlipidemia 02/13/2013  . Personal history of breast cancer 02/13/2013    Past Surgical History:  Procedure Laterality Date  . ABDOMINAL HYSTERECTOMY  1978   partial  . BACK SURGERY  04/1990, 1996  . BREAST BIOPSY Right 03/14/2012   2 areas UOQ of DCIS  . BREAST CYST ASPIRATION Left   . BREAST LUMPECTOMY Right 03/29/2012   right breast DCIS of two areas. Clear margins  . [place] PROCEDURE  09/1989   with rectal repair  . CHOLECYSTECTOMY  11/2005  . FOOT SURGERY  1992   left  . HAND SURGERY  10/1999   nerve repair right thumb  . KNEE SURGERY  07/31/2009   right  . KNEE SURGERY  2009   left  . THYROID LOBECTOMY  03/1992   left    Prior to Admission medications   Medication Sig Start Date End Date Taking? Authorizing Provider  aspirin  325 MG tablet Take 325 mg by mouth daily.     [provider]  Calcium Carb-Cholecalciferol (CALCIUM 600/VITAMIN D3) 600-800 MG-UNIT TABS Take 2 tablets by mouth daily with breakfast.     [provider]  cholecalciferol (VITAMIN D3) 25 MCG (1000 UT) tablet Take 1,000 Units by mouth daily.    [provider]  clopidogrel (PLAVIX) 75 MG tablet Take 75 mg by mouth daily.     [provider]  diazepam (VALIUM) 5 MG tablet Take 1 tablet by mouth prior to procedure. Patient not taking: Reported on 12/03/2017 09/16/17   Bo Merino, MD  fenofibrate 160 MG tablet Take 1 tablet (160 mg total)  by mouth daily. 12/05/17   Salary, Holly Bodily D, MD  ferrous sulfate 325 (65 FE) MG tablet Take 325 mg by mouth daily.     [provider]  glimepiride (AMARYL) 1 MG tablet Take 1 mg by mouth at bedtime.     [provider]  levothyroxine (SYNTHROID, LEVOTHROID) 50 MCG tablet Take 50 mcg by mouth daily.     [provider]  lisinopril-hydrochlorothiazide (PRINZIDE,ZESTORETIC) 10-12.5 MG tablet Take 1 tablet by mouth daily.     [provider]  metFORMIN (GLUCOPHAGE) 500 MG tablet Take 1,000 mg by mouth 2 (two) times daily.     [provider]  metoprolol tartrate (LOPRESSOR) 25 MG tablet Take 12.5 mg by mouth 2 (two) times daily.     [provider]  nitroGLYCERIN (NITROSTAT) 0.4 MG SL tablet Place 0.4 mg under the tongue every 5 (five) minutes as needed for chest pain (up to 3 doses).     [provider]  pantoprazole (PROTONIX) 40 MG tablet Take 40 mg by mouth daily.     [provider]  pravastatin (PRAVACHOL) 20 MG tablet Take 20 mg by mouth daily.     [provider]    Allergies Contrast media [iodinated diagnostic agents]; Codeine; Morphine and related; and Sulfa antibiotics  Family History  Problem Relation Age of Onset  . Breast cancer Sister 17  . Breast cancer Cousin     Social History Social History   Tobacco Use  . Smoking status: Never Smoker  . Smokeless tobacco: Never Used  Substance Use Topics  . Alcohol use: No  . Drug use: Never    Review of Systems Constitutional: No fever/chills Eyes: No visual changes. ENT: No sore throat. Cardiovascular: Denies chest pain. Respiratory: Denies shortness of breath. Gastrointestinal: No abdominal pain.  Positive for nausea.  Genitourinary: Negative for dysuria. Musculoskeletal: Negative for back pain. Skin: Negative for rash. Neurological: Positive for headache ____________________________________________   PHYSICAL EXAM:  VITAL SIGNS: ED  Triage Vitals  Enc Vitals Group     BP 12/08/17 2147 (!) 175/93     Pulse Rate 12/08/17 2147 95     Resp 12/08/17 2147 18     Temp 12/08/17 2147 98 F (36.7 C)     Temp Source 12/08/17 2147 Oral     SpO2 12/08/17 2147 94 %     Weight 12/08/17 2147 191 lb (86.6 kg)     Height 12/08/17 2147 '5\' 2"'  (1.575 m)     Head Circumference --      Peak Flow --      Pain Score 12/08/17 2150 10   Constitutional: Alert and oriented.  Eyes: Conjunctivae are normal.  ENT      Head: Normocephalic and atraumatic.      Nose: No congestion/rhinnorhea.  Mouth/Throat: Mucous membranes are moist.      Neck: No stridor. Hematological/Lymphatic/Immunilogical: No cervical lymphadenopathy. Cardiovascular: Normal rate, regular rhythm.  No murmurs, rubs, or gallops.  Respiratory: Normal respiratory effort without tachypnea nor retractions. Breath sounds are clear and equal bilaterally. No wheezes/rales/rhonchi. Gastrointestinal: Soft and non tender. No rebound. No guarding.  Genitourinary: Deferred Musculoskeletal: Normal range of motion in all extremities. No lower extremity edema. Neurologic:  Normal speech and language. No gross focal neurologic deficits are appreciated.  Skin:  Skin is warm, dry and intact. No rash noted. Psychiatric: Mood and affect are normal. Speech and behavior are normal. Patient exhibits appropriate insight and judgment.  ____________________________________________    LABS (pertinent positives/negatives)  ESR 7 CBC wbc 12.3, hgb 13.9, plt 277 CMP na 139, k 3.9, cr 1.02  ____________________________________________   EKG  None  ____________________________________________    RADIOLOGY  None  ____________________________________________   PROCEDURES  Procedures  ____________________________________________   INITIAL IMPRESSION / ASSESSMENT AND PLAN / ED COURSE  Pertinent labs & imaging results that were available during my care of the patient were  reviewed by me and considered in my medical decision making (see chart for details).   Patient presented to the emergency department today because of concerns for headache.  Patient had a recent admission with negative MRIs.  Did check ESR here given the location of the pain and that was negative.  Patient did feel some improvement after medication here.  Will plan on discharging.  Discussed with patient importance of follow-up with neurology.   ____________________________________________   FINAL CLINICAL IMPRESSION(S) / ED DIAGNOSES  Final diagnoses:  Bad headache     Note: This dictation was prepared with Dragon dictation. Any transcriptional errors that result from this process are unintentional     Nance Pear, MD 12/08/17 2351

## 2017-12-08 NOTE — ED Triage Notes (Signed)
Pt in with headache and nausea x 10 days, states was admitted Friday for the same but all tests were normal. STates headache is persistent and mostly at night.

## 2018-02-14 NOTE — Progress Notes (Deleted)
Office Visit Note  Patient: Brittney Ray             Date of Birth: 09-01-1950           MRN: [id number]             PCP: Dion Body, MD Referring: Dion Body, MD Visit Date: 02/28/2018 Occupation: @GUAROCC @  Subjective:  No chief complaint on file.   History of Present Illness: Brittney Ray is a 68 y.o. female ***   Activities of Daily Living:  Patient reports morning stiffness for *** {minute/hour:19697}.   Patient {ACTIONS;DENIES/REPORTS:21021675::"Denies"} nocturnal pain.  Difficulty dressing/grooming: {ACTIONS;DENIES/REPORTS:21021675::"Denies"} Difficulty climbing stairs: {ACTIONS;DENIES/REPORTS:21021675::"Denies"} Difficulty getting out of chair: {ACTIONS;DENIES/REPORTS:21021675::"Denies"} Difficulty using hands for taps, buttons, cutlery, and/or writing: {ACTIONS;DENIES/REPORTS:21021675::"Denies"}  No Rheumatology ROS completed.   PMFS History:  Patient Active Problem List   Diagnosis Date Noted  . TIA (transient ischemic attack) 12/03/2017  . Primary osteoarthritis of both hands 08/30/2017  . Trochanteric bursitis of both hips 08/30/2017  . DDD (degenerative disc disease), lumbar 08/30/2017  . H/O Paget's disease of bone 08/30/2017  . Osteopenia of neck of right femur 03/04/2017  . Primary osteoarthritis of both knees 01/10/2016  . Chronic pain of both knees 01/10/2016  . Obesity (BMI 30-39.9) 03/25/2015  . Multinodular goiter 03/04/2015  . Vaccine counseling 10/24/2014  . Postprocedural hypothyroidism 10/08/2014  . Type 2 diabetes mellitus without complication, without long-term current use of insulin ([place]) 10/08/2014  . Breast cancer in situ 08/30/2014  . Coronary artery disease 08/17/2013  . NSTEMI (non-ST elevated myocardial infarction) ([place]) 08/17/2013  . Acquired hypothyroidism 02/13/2013  . Diabetes mellitus type 2, uncomplicated ([place]) 70/01/7492  . Essential hypertension 02/13/2013  . Mixed hyperlipidemia 02/13/2013  .  Personal history of breast cancer 02/13/2013    Past Medical History:  Diagnosis Date  . Anxiety    panic attacks; no curren med.  . Arthritis    knees  . Breast cancer ([place]) 2014   RT LUMPECTOMY  . Diabetes mellitus    NIDDM  . GERD (gastroesophageal reflux disease)   . H/O hiatal hernia   . Heart murmur    states never had any problems  . Hypertension    under control, has been on med. > 10 yrs.  . Hypothyroidism   . Medial meniscus tear 04/2011   right knee  . Nocturia    3-4 x/night  . Paget's bone disease    hips  . Personal history of radiation therapy 2014   right BREAST CA  . PONV (postoperative nausea and vomiting)     Family History  Problem Relation Age of Onset  . Breast cancer Sister 30  . Breast cancer Cousin    Past Surgical History:  Procedure Laterality Date  . ABDOMINAL HYSTERECTOMY  1978   partial  . BACK SURGERY  04/1990, 1996  . BREAST BIOPSY Right 03/14/2012   2 areas UOQ of DCIS  . BREAST CYST ASPIRATION Left   . BREAST LUMPECTOMY Right 03/29/2012   right breast DCIS of two areas. Clear margins  . [place] PROCEDURE  09/1989   with rectal repair  . CHOLECYSTECTOMY  11/2005  . FOOT SURGERY  1992   left  . HAND SURGERY  10/1999   nerve repair right thumb  . KNEE SURGERY  07/31/2009   right  . KNEE SURGERY  2009   left  . THYROID LOBECTOMY  03/1992   left   Social History   Social History Narrative  .  Not on file   Immunization History  Administered Date(s) Administered  . Influenza-Unspecified 09/12/2013     Objective: Vital Signs: There were no vitals taken for this visit.   Physical Exam   Musculoskeletal Exam: ***  CDAI Exam: CDAI Score: Not documented Patient Global Assessment: Not documented; Provider Global Assessment: Not documented Swollen: Not documented; Tender: Not documented Joint Exam   Not documented   There is currently no information documented on the homunculus. Go to the Rheumatology activity and  complete the homunculus joint exam.  Investigation: No additional findings.  Imaging: No results found.  Recent Labs: Lab Results  Component Value Date   WBC 12.3 (H) 12/08/2017   HGB 13.9 12/08/2017   PLT 277 12/08/2017   NA 139 12/08/2017   K 3.9 12/08/2017   CL 102 12/08/2017   CO2 23 12/08/2017   GLUCOSE 86 12/08/2017   BUN 24 (H) 12/08/2017   CREATININE 1.02 (H) 12/08/2017   BILITOT 0.5 12/08/2017   ALKPHOS 45 12/08/2017   AST 30 12/08/2017   ALT 36 12/08/2017   PROT 7.1 12/08/2017   ALBUMIN 4.2 12/08/2017   CALCIUM 9.3 12/08/2017   GFRAA >60 12/08/2017    Speciality Comments: No specialty comments available.  Procedures:  No procedures performed Allergies: Contrast media [iodinated diagnostic agents]; Codeine; Morphine and related; and Sulfa antibiotics   Assessment / Plan:     Visit Diagnoses: No diagnosis found.   Orders: No orders of the defined types were placed in this encounter.  No orders of the defined types were placed in this encounter.   Face-to-face time spent with patient was *** minutes. Greater than 50% of time was spent in counseling and coordination of care.  Follow-Up Instructions: No follow-ups on file.   Brittney Ray, CMA  Note - This record has been created using Editor, commissioning.  Chart creation errors have been sought, but may not always  have been located. Such creation errors do not reflect on  the standard of medical care.

## 2018-02-25 NOTE — Progress Notes (Signed)
Office Visit Note  Patient: Brittney Ray             Date of Birth: 1950/09/12           MRN: [id number]             PCP: Dion Body, MD Referring: Dion Body, MD Visit Date: 03/10/2018 Occupation: @GUAROCC @  Subjective:  Right knee joint pain   History of Present Illness: Brittney Ray is a 68 y.o. female with history of osteoarthritis, DDD, and Paget's disease.  She reports that on 01/10/18 she fell going down a ramp at a restaurant but she reports she did not have any serious injuries and has been doing well since.  She states she has been experiencing right knee pain at night, especially with extension.  She denies any joint swelling.  She denies any left knee joint pain.  She has bilateral trochanteric bursitis, worse in the right than left. The pain is worse when walking for prolonged distances.   Activities of Daily Living:  Patient reports morning stiffness for 5  minutes.   Patient Reports nocturnal pain.  Difficulty dressing/grooming: Denies Difficulty climbing stairs: Denies Difficulty getting out of chair: Reports Difficulty using hands for taps, buttons, cutlery, and/or writing: Denies  Review of Systems  Constitutional: Positive for fatigue.  HENT: Positive for mouth dryness. Negative for mouth sores and nose dryness.   Eyes: Negative for pain, visual disturbance and dryness.  Respiratory: Negative for cough, hemoptysis, shortness of breath and difficulty breathing.   Cardiovascular: Negative for chest pain, palpitations, hypertension and swelling in legs/feet.  Gastrointestinal: Negative for blood in stool, constipation and diarrhea.  Endocrine: Negative for increased urination.  Genitourinary: Negative for painful urination.  Musculoskeletal: Positive for arthralgias, joint pain and morning stiffness. Negative for joint swelling, myalgias, muscle weakness, muscle tenderness and myalgias.  Skin: Negative for color change, pallor, rash,  hair loss, nodules/bumps, skin tightness, ulcers and sensitivity to sunlight.  Allergic/Immunologic: Negative for susceptible to infections.  Neurological: Negative for dizziness, numbness, headaches and weakness.  Hematological: Negative for swollen glands.  Psychiatric/Behavioral: Positive for depressed mood. Negative for sleep disturbance. The patient is not nervous/anxious.     PMFS History:  Patient Active Problem List   Diagnosis Date Noted  . TIA (transient ischemic attack) 12/03/2017  . Primary osteoarthritis of both hands 08/30/2017  . Trochanteric bursitis of both hips 08/30/2017  . DDD (degenerative disc disease), lumbar 08/30/2017  . H/O Paget's disease of bone 08/30/2017  . Osteopenia of neck of right femur 03/04/2017  . Primary osteoarthritis of both knees 01/10/2016  . Chronic pain of both knees 01/10/2016  . Obesity (BMI 30-39.9) 03/25/2015  . Multinodular goiter 03/04/2015  . Vaccine counseling 10/24/2014  . Postprocedural hypothyroidism 10/08/2014  . Type 2 diabetes mellitus without complication, without long-term current use of insulin ([place]) 10/08/2014  . Breast cancer in situ 08/30/2014  . Coronary artery disease 08/17/2013  . NSTEMI (non-ST elevated myocardial infarction) ([place]) 08/17/2013  . Acquired hypothyroidism 02/13/2013  . Diabetes mellitus type 2, uncomplicated ([place]) 53/66/4403  . Essential hypertension 02/13/2013  . Mixed hyperlipidemia 02/13/2013  . Personal history of breast cancer 02/13/2013    Past Medical History:  Diagnosis Date  . Anxiety    panic attacks; no curren med.  . Arthritis    knees  . Breast cancer ([place]) 2014   RT LUMPECTOMY  . Diabetes mellitus    NIDDM  . GERD (gastroesophageal reflux disease)   . H/O hiatal  hernia   . Heart murmur    states never had any problems  . Hypertension    under control, has been on med. > 10 yrs.  . Hypothyroidism   . Medial meniscus tear 04/2011   right knee  . Nocturia    3-4 x/night  .  Paget's bone disease    hips  . Personal history of radiation therapy 2014   right BREAST CA  . PONV (postoperative nausea and vomiting)     Family History  Problem Relation Age of Onset  . Breast cancer Sister 26  . Breast cancer Cousin    Past Surgical History:  Procedure Laterality Date  . ABDOMINAL HYSTERECTOMY  1978   partial  . BACK SURGERY  04/1990, 1996  . BREAST BIOPSY Right 03/14/2012   2 areas UOQ of DCIS  . BREAST CYST ASPIRATION Left   . BREAST LUMPECTOMY Right 03/29/2012   right breast DCIS of two areas. Clear margins  . [place] PROCEDURE  09/1989   with rectal repair  . CHOLECYSTECTOMY  11/2005  . FOOT SURGERY  1992   left  . HAND SURGERY  10/1999   nerve repair right thumb  . KNEE SURGERY  07/31/2009   right  . KNEE SURGERY  2009   left  . THYROID LOBECTOMY  03/1992   left   Social History   Social History Narrative  . Not on file   Immunization History  Administered Date(s) Administered  . Influenza-Unspecified 09/12/2013     Objective: Vital Signs: BP (!) 162/83 (BP Location: Left Arm, Patient Position: Sitting, Cuff Size: Large)   Pulse 75   Resp 14   Ht 5\' 1"  (1.549 m)   Wt 192 lb 3.2 oz (87.2 kg)   BMI 36.32 kg/m    Physical Exam Vitals signs and nursing note reviewed.  Constitutional:      Appearance: She is well-developed.  HENT:     Head: Normocephalic and atraumatic.  Eyes:     Conjunctiva/sclera: Conjunctivae normal.  Neck:     Musculoskeletal: Normal range of motion.  Cardiovascular:     Rate and Rhythm: Normal rate and regular rhythm.     Heart sounds: Normal heart sounds.  Pulmonary:     Effort: Pulmonary effort is normal.     Breath sounds: Normal breath sounds.  Abdominal:     General: Bowel sounds are normal.     Palpations: Abdomen is soft.  Lymphadenopathy:     Cervical: No cervical adenopathy.  Skin:    General: Skin is warm and dry.     Capillary Refill: Capillary refill takes less than 2 seconds.    Neurological:     Mental Status: She is alert and oriented to person, place, and time.  Psychiatric:        Behavior: Behavior normal.      Musculoskeletal Exam: C-spine, thoracic spine, and lumbar spine good ROM.  No midline spinal tenderness.  No SI joint tenderness.  Shoulder joints, elbow joints, wrist joints, MCPs, PIPs, and DIPs good ROM with no synovitis.  Complete fist formation bilaterally.  Hip joints, knee joints, ankle joints, MTPs, PIPs, and DIPs good ROM with no synovitis.  No warmth or effusion of knee joints.  No tenderness or swelling of ankle joints.   CDAI Exam: CDAI Score: Not documented Patient Global Assessment: Not documented; Provider Global Assessment: Not documented Swollen: Not documented; Tender: Not documented Joint Exam   Not documented   There is currently no information  documented on the homunculus. Go to the Rheumatology activity and complete the homunculus joint exam.  Investigation: No additional findings.  Imaging: No results found.  Recent Labs: Lab Results  Component Value Date   WBC 12.3 (H) 12/08/2017   HGB 13.9 12/08/2017   PLT 277 12/08/2017   NA 139 12/08/2017   K 3.9 12/08/2017   CL 102 12/08/2017   CO2 23 12/08/2017   GLUCOSE 86 12/08/2017   BUN 24 (H) 12/08/2017   CREATININE 1.02 (H) 12/08/2017   BILITOT 0.5 12/08/2017   ALKPHOS 45 12/08/2017   AST 30 12/08/2017   ALT 36 12/08/2017   PROT 7.1 12/08/2017   ALBUMIN 4.2 12/08/2017   CALCIUM 9.3 12/08/2017   GFRAA >60 12/08/2017    Speciality Comments: No specialty comments available.  Procedures:  No procedures performed Allergies: Contrast media [iodinated diagnostic agents]; Codeine; Morphine and related; and Sulfa antibiotics   Assessment / Plan:     Visit Diagnoses: Primary osteoarthritis of both hands: She has complete fist formation.  She has no discomfort at this time. Joint protection and muscle strengthening were discussed.   Primary osteoarthritis of both  knees: She has no warmth or effusion of knee joints.  She has good ROM.  She has been experienced pain with extension of the right knee joint.  She has some difficulty going up and down stairs.  She had Hyalgan injections bilaterally in March/April 2019 and had great results.  She would like to reapply for Hyalgan injections.  She cannot have a cortisone injection today due to elevated BP and hx of diabetes.  X-rays of both knees were obtained on 03/04/17 that revealed moderate to severe osteoarthritis.   Trochanteric bursitis of both hips: She has tenderness over bilateral trochanteric bursa.   DDD (degenerative disc disease), lumbar: She has no midline spinal tenderness.  She has good ROM on exam with no discomfort.   H/O Paget's disease of bone - She has significant Paget's disease involving bilateral femur bone.  More prominent in the left femur  Other medical conditions are listed as follows:   History of breast cancer  History of gastroesophageal reflux (GERD)  History of diabetes mellitus  History of coronary artery disease  History of hypertension  History of hyperlipidemia  History of hypothyroidism   Orders: No orders of the defined types were placed in this encounter.  No orders of the defined types were placed in this encounter.     Follow-Up Instructions: Return in about 6 months (around 09/08/2018) for Osteoarthritis.   Ofilia Neas, PA-C I examined and evaluated the patient with Hazel Sams PA.  Patient has no synovitis on examination.  She continues to have some discomfort from underlying osteoarthritis.  Her right knee joint has been very painful.  We will schedule Visco supplement injections again.  She had good response to Visco supplement injections in the past.  The plan of care was discussed as noted above.  Bo Merino, MD Note - This record has been created using Editor, commissioning.  Chart creation errors have been sought, but may not always  have  been located. Such creation errors do not reflect on  the standard of medical care.

## 2018-02-28 ENCOUNTER — Ambulatory Visit: Payer: Medicare Other | Admitting: Rheumatology

## 2018-03-10 ENCOUNTER — Ambulatory Visit (INDEPENDENT_AMBULATORY_CARE_PROVIDER_SITE_OTHER): Payer: Medicare Other | Admitting: Rheumatology

## 2018-03-10 ENCOUNTER — Telehealth: Payer: Self-pay

## 2018-03-10 ENCOUNTER — Encounter: Payer: Self-pay | Admitting: Rheumatology

## 2018-03-10 ENCOUNTER — Encounter (INDEPENDENT_AMBULATORY_CARE_PROVIDER_SITE_OTHER): Payer: Self-pay

## 2018-03-10 VITALS — BP 162/83 | HR 75 | Resp 14 | Ht 61.0 in | Wt 192.2 lb

## 2018-03-10 DIAGNOSIS — M19041 Primary osteoarthritis, right hand: Secondary | ICD-10-CM

## 2018-03-10 DIAGNOSIS — M19042 Primary osteoarthritis, left hand: Secondary | ICD-10-CM | POA: Diagnosis not present

## 2018-03-10 DIAGNOSIS — M5136 Other intervertebral disc degeneration, lumbar region: Secondary | ICD-10-CM | POA: Diagnosis not present

## 2018-03-10 DIAGNOSIS — M170 Bilateral primary osteoarthritis of knee: Secondary | ICD-10-CM

## 2018-03-10 DIAGNOSIS — M7061 Trochanteric bursitis, right hip: Secondary | ICD-10-CM | POA: Diagnosis not present

## 2018-03-10 DIAGNOSIS — Z8739 Personal history of other diseases of the musculoskeletal system and connective tissue: Secondary | ICD-10-CM

## 2018-03-10 DIAGNOSIS — M7062 Trochanteric bursitis, left hip: Secondary | ICD-10-CM

## 2018-03-10 DIAGNOSIS — Z8639 Personal history of other endocrine, nutritional and metabolic disease: Secondary | ICD-10-CM

## 2018-03-10 DIAGNOSIS — Z853 Personal history of malignant neoplasm of breast: Secondary | ICD-10-CM

## 2018-03-10 DIAGNOSIS — Z8679 Personal history of other diseases of the circulatory system: Secondary | ICD-10-CM

## 2018-03-10 DIAGNOSIS — Z8719 Personal history of other diseases of the digestive system: Secondary | ICD-10-CM

## 2018-03-10 NOTE — Telephone Encounter (Signed)
Please apply for bilateral hyalgan, per Brittney Ray. Thanks!

## 2018-03-11 NOTE — Telephone Encounter (Signed)
Noted  

## 2018-03-15 ENCOUNTER — Telehealth (INDEPENDENT_AMBULATORY_CARE_PROVIDER_SITE_OTHER): Payer: Self-pay

## 2018-03-15 NOTE — Telephone Encounter (Signed)
Submitted VOB for Hyalgan, bilateral knee. Faxed completed application to [phone].

## 2018-03-17 ENCOUNTER — Telehealth (INDEPENDENT_AMBULATORY_CARE_PROVIDER_SITE_OTHER): Payer: Self-pay

## 2018-03-17 NOTE — Telephone Encounter (Signed)
Please schedule patient an appointment with Dr. Estanislado Pandy or Lovena Le for gel injection.  Thank You.  Patient is Approved for Hyalgan sereis, bilateral knee. Buy & Bill Covered at 100% through second insurance after Medicare deductible has been met. No Co-pay No PA required

## 2018-03-18 NOTE — Telephone Encounter (Signed)
LMOM for patient to call and schedule Hyalgan injections. °

## 2018-03-31 ENCOUNTER — Ambulatory Visit (INDEPENDENT_AMBULATORY_CARE_PROVIDER_SITE_OTHER): Payer: Medicare Other | Admitting: Physician Assistant

## 2018-03-31 ENCOUNTER — Other Ambulatory Visit: Payer: Self-pay

## 2018-03-31 DIAGNOSIS — M170 Bilateral primary osteoarthritis of knee: Secondary | ICD-10-CM | POA: Diagnosis not present

## 2018-03-31 MED ORDER — LIDOCAINE HCL 1 % IJ SOLN
1.5000 mL | INTRAMUSCULAR | Status: AC | PRN
  Administered 2018-03-31: 1.5 mL

## 2018-03-31 MED ORDER — SODIUM HYALURONATE (VISCOSUP) 20 MG/2ML IX SOSY
20.0000 mg | PREFILLED_SYRINGE | INTRA_ARTICULAR | Status: AC | PRN
  Administered 2018-03-31: 20 mg via INTRA_ARTICULAR

## 2018-03-31 NOTE — Progress Notes (Addendum)
   Procedure Note  Patient: Brittney Ray             Date of Birth: 1950/04/25           MRN: [id number]             Visit Date: 03/31/2018  Procedures: Visit Diagnoses: Primary osteoarthritis of both knees - Plan: Large Joint Inj: bilateral knee Hyalgan #1 bilateral knee joint injections  Large Joint Inj: bilateral knee on 03/31/2018 11:23 AM Indications: pain Details: 27 G 1.5 in needle, medial approach  Arthrogram: No  Medications (Right): 20 mg Sodium Hyaluronate 20 MG/2ML; 1.5 mL lidocaine 1 % Aspirate (Right): 0 mL Medications (Left): 20 mg Sodium Hyaluronate 20 MG/2ML; 1.5 mL lidocaine 1 % Aspirate (Left): 0 mL Outcome: tolerated well, no immediate complications Procedure, treatment alternatives, risks and benefits explained, specific risks discussed. Consent was given by the patient. Immediately prior to procedure a time out was called to verify the correct patient, procedure, equipment, support staff and site/side marked as required. Patient was prepped and draped in the usual sterile fashion.    Patient tolerated the procedure well.  Hazel Sams, PA-C Hyalgan:  LOT [id number] EXP 2/30/22

## 2018-04-01 ENCOUNTER — Ambulatory Visit: Payer: Medicare Other | Admitting: Physician Assistant

## 2018-04-08 ENCOUNTER — Ambulatory Visit: Payer: Medicare Other | Admitting: Physician Assistant

## 2018-04-15 ENCOUNTER — Ambulatory Visit: Payer: Medicare Other | Admitting: Physician Assistant

## 2018-04-25 ENCOUNTER — Ambulatory Visit: Payer: Medicare Other | Admitting: Physician Assistant

## 2018-05-02 ENCOUNTER — Ambulatory Visit: Payer: Medicare Other | Admitting: Physician Assistant

## 2018-05-02 ENCOUNTER — Other Ambulatory Visit: Payer: Self-pay | Admitting: Family Medicine

## 2018-05-02 DIAGNOSIS — R921 Mammographic calcification found on diagnostic imaging of breast: Secondary | ICD-10-CM

## 2018-05-12 ENCOUNTER — Other Ambulatory Visit: Payer: No Typology Code available for payment source

## 2018-05-16 ENCOUNTER — Other Ambulatory Visit: Payer: Self-pay

## 2018-05-16 ENCOUNTER — Ambulatory Visit
Admission: RE | Admit: 2018-05-16 | Discharge: 2018-05-16 | Disposition: A | Discharge status: Home / Self Care | Payer: Medicare Other | Source: Ambulatory Visit | Attending: Family Medicine | Admitting: Family Medicine

## 2018-05-16 DIAGNOSIS — R921 Mammographic calcification found on diagnostic imaging of breast: Secondary | ICD-10-CM

## 2018-09-20 ENCOUNTER — Ambulatory Visit (INDEPENDENT_AMBULATORY_CARE_PROVIDER_SITE_OTHER): Payer: Medicare Other | Admitting: Physician Assistant

## 2018-09-20 ENCOUNTER — Other Ambulatory Visit: Payer: Self-pay

## 2018-09-20 ENCOUNTER — Encounter (INDEPENDENT_AMBULATORY_CARE_PROVIDER_SITE_OTHER): Payer: Self-pay

## 2018-09-20 DIAGNOSIS — M170 Bilateral primary osteoarthritis of knee: Secondary | ICD-10-CM

## 2018-09-20 MED ORDER — LIDOCAINE HCL 1 % IJ SOLN
1.5000 mL | INTRAMUSCULAR | Status: AC | PRN
  Administered 2018-09-20: 1.5 mL

## 2018-09-20 MED ORDER — SODIUM HYALURONATE (VISCOSUP) 20 MG/2ML IX SOSY
20.0000 mg | PREFILLED_SYRINGE | INTRA_ARTICULAR | Status: AC | PRN
  Administered 2018-09-20: 20 mg via INTRA_ARTICULAR

## 2018-09-20 NOTE — Progress Notes (Signed)
   Procedure Note  Patient: Brittney Ray             Date of Birth: 02-21-1950           MRN: [id number]             Visit Date: 09/20/2018  Procedures: Visit Diagnoses:  1. Primary osteoarthritis of both knees   Hyalgan #2 Bilateral knee joint injections B/B  Large Joint Inj: bilateral knee on 09/20/2018 8:15 AM Indications: pain Details: 27 G 1.5 in needle, medial approach  Arthrogram: No  Medications (Right): 20 mg Sodium Hyaluronate 20 MG/2ML; 1.5 mL lidocaine 1 % Aspirate (Right): 0 mL Medications (Left): 20 mg Sodium Hyaluronate 20 MG/2ML; 1.5 mL lidocaine 1 % Aspirate (Left): 0 mL Outcome: tolerated well, no immediate complications Procedure, treatment alternatives, risks and benefits explained, specific risks discussed. Consent was given by the patient. Immediately prior to procedure a time out was called to verify the correct patient, procedure, equipment, support staff and site/side marked as required. Patient was prepped and draped in the usual sterile fashion.     Patient tolerated the procedure well.  Hazel Sams, PA-C

## 2018-09-27 ENCOUNTER — Ambulatory Visit (INDEPENDENT_AMBULATORY_CARE_PROVIDER_SITE_OTHER): Payer: Medicare Other | Admitting: Physician Assistant

## 2018-09-27 ENCOUNTER — Other Ambulatory Visit: Payer: Self-pay

## 2018-09-27 DIAGNOSIS — M170 Bilateral primary osteoarthritis of knee: Secondary | ICD-10-CM

## 2018-09-27 NOTE — Progress Notes (Signed)
   Procedure Note  Patient: Brittney Ray             Date of Birth: 01-Apr-1950           MRN: [id number]             Visit Date: 09/27/2018  Procedures: Visit Diagnoses:  1. Primary osteoarthritis of both knees    Hyalgan #3 Bilateral knee joint injections  Large Joint Inj: bilateral knee on 09/27/2018 12:25 PM Indications: pain Details: 27 G 1.5 in needle, medial approach  Arthrogram: No  Medications (Right): 1.5 mL lidocaine 1 %; 20 mg Sodium Hyaluronate 20 MG/2ML Aspirate (Right): 0 mL Medications (Left): 1.5 mL lidocaine 1 %; 20 mg Sodium Hyaluronate 20 MG/2ML Aspirate (Left): 0 mL Outcome: tolerated well, no immediate complications Procedure, treatment alternatives, risks and benefits explained, specific risks discussed. Consent was given by the patient. Immediately prior to procedure a time out was called to verify the correct patient, procedure, equipment, support staff and site/side marked as required. Patient was prepped and draped in the usual sterile fashion.      Patient tolerated the procedure well.  Hazel Sams, PA-C

## 2018-10-04 ENCOUNTER — Other Ambulatory Visit: Payer: Self-pay

## 2018-10-04 ENCOUNTER — Ambulatory Visit (INDEPENDENT_AMBULATORY_CARE_PROVIDER_SITE_OTHER): Payer: Medicare Other | Admitting: Physician Assistant

## 2018-10-04 DIAGNOSIS — M170 Bilateral primary osteoarthritis of knee: Secondary | ICD-10-CM

## 2018-10-04 MED ORDER — LIDOCAINE HCL 1 % IJ SOLN
1.5000 mL | INTRAMUSCULAR | Status: AC | PRN
  Administered 2018-10-04: 1.5 mL

## 2018-10-04 MED ORDER — SODIUM HYALURONATE (VISCOSUP) 20 MG/2ML IX SOSY
20.0000 mg | PREFILLED_SYRINGE | INTRA_ARTICULAR | Status: AC | PRN
  Administered 2018-10-04: 20 mg via INTRA_ARTICULAR

## 2018-10-04 NOTE — Progress Notes (Signed)
   Procedure Note  Patient: Brittney Ray             Date of Birth: August 04, 1950           MRN: [id number]             Visit Date: 10/04/2018  Procedures: Visit Diagnoses:  1. Primary osteoarthritis of both knees    Hyalgan #4 bilateral knee joint injections B/B  Large Joint Inj: bilateral knee on 10/04/2018 8:36 AM Indications: pain Details: 27 G 1.5 in needle, medial approach  Arthrogram: No  Medications (Right): 20 mg Sodium Hyaluronate 20 MG/2ML; 1.5 mL lidocaine 1 % Aspirate (Right): 0 mL Medications (Left): 20 mg Sodium Hyaluronate 20 MG/2ML; 1.5 mL lidocaine 1 % Aspirate (Left): 0 mL Outcome: tolerated well, no immediate complications Procedure, treatment alternatives, risks and benefits explained, specific risks discussed. Consent was given by the patient. Immediately prior to procedure a time out was called to verify the correct patient, procedure, equipment, support staff and site/side marked as required. Patient was prepped and draped in the usual sterile fashion.      Patient tolerated the procedure well.  Hazel Sams, PA-C

## 2018-10-11 ENCOUNTER — Ambulatory Visit (INDEPENDENT_AMBULATORY_CARE_PROVIDER_SITE_OTHER): Payer: Medicare Other | Admitting: Physician Assistant

## 2018-10-11 ENCOUNTER — Other Ambulatory Visit: Payer: Self-pay

## 2018-10-11 DIAGNOSIS — M170 Bilateral primary osteoarthritis of knee: Secondary | ICD-10-CM | POA: Diagnosis not present

## 2018-10-11 MED ORDER — SODIUM HYALURONATE (VISCOSUP) 20 MG/2ML IX SOSY
20.0000 mg | PREFILLED_SYRINGE | INTRA_ARTICULAR | Status: AC | PRN
  Administered 2018-10-11: 20 mg via INTRA_ARTICULAR

## 2018-10-11 MED ORDER — LIDOCAINE HCL 1 % IJ SOLN
1.5000 mL | INTRAMUSCULAR | Status: AC | PRN
  Administered 2018-10-11: 1.5 mL

## 2018-10-11 NOTE — Progress Notes (Signed)
   Procedure Note  Patient: Brittney Ray             Date of Birth: [birth year]           MRN: [id number]             Visit Date: 10/11/2018  Procedures: Visit Diagnoses:  1. Primary osteoarthritis of both knees    Hyalgan #5 Bilateral knee joint injections   Large Joint Inj: bilateral knee on 10/11/2018 9:36 AM Indications: pain Details: 27 G 1.5 in needle, medial approach  Arthrogram: No  Medications (Right): 1.5 mL lidocaine 1 %; 20 mg Sodium Hyaluronate 20 MG/2ML Aspirate (Right): 0 mL Medications (Left): 1.5 mL lidocaine 1 %; 20 mg Sodium Hyaluronate 20 MG/2ML Aspirate (Left): 0 mL Outcome: tolerated well, no immediate complications Procedure, treatment alternatives, risks and benefits explained, specific risks discussed. Consent was given by the patient. Immediately prior to procedure a time out was called to verify the correct patient, procedure, equipment, support staff and site/side marked as required. Patient was prepped and draped in the usual sterile fashion.      Patient tolerated the procedure well.  Hazel Sams, PA-C

## 2018-10-21 ENCOUNTER — Other Ambulatory Visit: Payer: Self-pay

## 2018-10-21 DIAGNOSIS — Z20822 Contact with and (suspected) exposure to covid-19: Secondary | ICD-10-CM

## 2018-10-23 LAB — NOVEL CORONAVIRUS, NAA: SARS-CoV-2, NAA: NOT DETECTED

## 2018-10-25 NOTE — Progress Notes (Signed)
Office Visit Note  Patient: Brittney Ray             Date of Birth: December 30, 1950           MRN: [id number]             PCP: Dion Body, MD Referring: Dion Body, MD Visit Date: 11/08/2018 Occupation: @GUAROCC @  Subjective:  Pain in right hand  History of Present Illness: Brittney Ray is a 68 y.o. female with history of osteoarthritis, DDD, and Paget's.  She finished a course of physical supplement injections about 2 weeks ago which was helpful.  She states after finishing the course of physical supplement injection she had a flare of trochanteric bursitis which is gradually improved.  She continues to have some discomfort in her hands due to underlying osteoarthritis.  She states she has discomfort in her right middle finger.  Activities of Daily Living:  Patient reports morning stiffness for 5  minutes.   Patient Denies nocturnal pain.  Difficulty dressing/grooming: Denies Difficulty climbing stairs: Reports Difficulty getting out of chair: Denies Difficulty using hands for taps, buttons, cutlery, and/or writing: Denies  Review of Systems  Constitutional: Positive for fatigue. Negative for night sweats, weight gain and weight loss.  HENT: Negative for mouth sores, trouble swallowing, trouble swallowing, mouth dryness and nose dryness.   Eyes: Negative for pain, redness, visual disturbance and dryness.  Respiratory: Negative for cough, shortness of breath and difficulty breathing.   Cardiovascular: Negative for chest pain, palpitations, hypertension, irregular heartbeat and swelling in legs/feet.  Gastrointestinal: Negative for blood in stool, constipation and diarrhea.  Endocrine: Negative for increased urination.  Genitourinary: Negative for vaginal dryness.  Musculoskeletal: Positive for arthralgias, joint pain and morning stiffness. Negative for joint swelling, myalgias, muscle weakness, muscle tenderness and myalgias.  Skin: Negative for color  change, rash, hair loss, skin tightness, ulcers and sensitivity to sunlight.  Allergic/Immunologic: Negative for susceptible to infections.  Neurological: Negative for dizziness, memory loss, night sweats and weakness.  Hematological: Negative for swollen glands.  Psychiatric/Behavioral: Positive for sleep disturbance. Negative for depressed mood. The patient is not nervous/anxious.     PMFS History:  Patient Active Problem List   Diagnosis Date Noted  . TIA (transient ischemic attack) 12/03/2017  . Primary osteoarthritis of both hands 08/30/2017  . Trochanteric bursitis of both hips 08/30/2017  . DDD (degenerative disc disease), lumbar 08/30/2017  . H/O Paget's disease of bone 08/30/2017  . Osteopenia of neck of right femur 03/04/2017  . Primary osteoarthritis of both knees 01/10/2016  . Chronic pain of both knees 01/10/2016  . Obesity (BMI 30-39.9) 03/25/2015  . Multinodular goiter 03/04/2015  . Vaccine counseling 10/24/2014  . Postprocedural hypothyroidism 10/08/2014  . Type 2 diabetes mellitus without complication, without long-term current use of insulin ([place]) 10/08/2014  . Breast cancer in situ 08/30/2014  . Coronary artery disease 08/17/2013  . NSTEMI (non-ST elevated myocardial infarction) ([place]) 08/17/2013  . Acquired hypothyroidism 02/13/2013  . Diabetes mellitus type 2, uncomplicated ([place]) 123XX123  . Essential hypertension 02/13/2013  . Mixed hyperlipidemia 02/13/2013  . Personal history of breast cancer 02/13/2013    Past Medical History:  Diagnosis Date  . Anxiety    panic attacks; no curren med.  . Arthritis    knees  . Breast cancer ([place]) 2014   RT LUMPECTOMY DCIS  . Diabetes mellitus    NIDDM  . GERD (gastroesophageal reflux disease)   . H/O hiatal hernia   . Heart murmur  states never had any problems  . Hypertension    under control, has been on med. > 10 yrs.  . Hypothyroidism   . Medial meniscus tear 04/2011   right knee  . Nocturia    3-4  x/night  . Paget's bone disease    hips  . Personal history of radiation therapy 2014   right BREAST CA  . PONV (postoperative nausea and vomiting)     Family History  Problem Relation Age of Onset  . Breast cancer Sister 63  . Breast cancer Cousin        maternal side 1st cousin   Past Surgical History:  Procedure Laterality Date  . ABDOMINAL HYSTERECTOMY  1978   partial  . BACK SURGERY  04/1990, 1996  . BREAST BIOPSY Right 03/14/2012   2 areas UOQ of DCIS  . BREAST CYST ASPIRATION Left   . BREAST LUMPECTOMY Right 03/29/2012   right breast DCIS of two areas. Clear margins  . [place] PROCEDURE  09/1989   with rectal repair  . CHOLECYSTECTOMY  11/2005  . FOOT SURGERY  1992   left  . HAND SURGERY  10/1999   nerve repair right thumb  . KNEE SURGERY  07/31/2009   right  . KNEE SURGERY  2009   left  . THYROID LOBECTOMY  03/1992   left   Social History   Social History Narrative  . Not on file   Immunization History  Administered Date(s) Administered  . Influenza-Unspecified 09/12/2013     Objective: Vital Signs: BP (!) 155/77 (BP Location: Left Arm, Patient Position: Sitting, Cuff Size: Large)   Pulse 77   Resp 12   Ht 5\' 1"  (1.549 m)   Wt 196 lb 12.8 oz (89.3 kg)   BMI 37.19 kg/m    Physical Exam Vitals signs and nursing note reviewed.  Constitutional:      Appearance: She is well-developed.  HENT:     Head: Normocephalic and atraumatic.  Eyes:     Conjunctiva/sclera: Conjunctivae normal.  Neck:     Musculoskeletal: Normal range of motion.  Cardiovascular:     Rate and Rhythm: Normal rate and regular rhythm.     Heart sounds: Normal heart sounds.  Pulmonary:     Effort: Pulmonary effort is normal.     Breath sounds: Normal breath sounds.  Abdominal:     General: Bowel sounds are normal.     Palpations: Abdomen is soft.  Lymphadenopathy:     Cervical: No cervical adenopathy.  Skin:    General: Skin is warm and dry.     Capillary Refill: Capillary  refill takes less than 2 seconds.  Neurological:     Mental Status: She is alert and oriented to person, place, and time.  Psychiatric:        Behavior: Behavior normal.      Musculoskeletal Exam: C-spine was in good range of motion.  Shoulder joints elbow joints wrist joints with good range of motion.  She has bilateral PIP and DIP thickening.  She has difficulty making complete fist with her right middle finger.  No synovitis was noted.  She had mild tenderness over trochanteric bursa today.  She had good range of motion of bilateral knee joints without any warmth swelling or effusion.  CDAI Exam: CDAI Score: - Patient Global: -; Provider Global: - Swollen: -; Tender: - Joint Exam   No joint exam has been documented for this visit   There is currently no information documented on  the homunculus. Go to the Rheumatology activity and complete the homunculus joint exam.  Investigation: No additional findings.  Imaging: No results found.  Recent Labs: Lab Results  Component Value Date   WBC 12.3 (H) 12/08/2017   HGB 13.9 12/08/2017   PLT 277 12/08/2017   NA 139 12/08/2017   K 3.9 12/08/2017   CL 102 12/08/2017   CO2 23 12/08/2017   GLUCOSE 86 12/08/2017   BUN 24 (H) 12/08/2017   CREATININE 1.02 (H) 12/08/2017   BILITOT 0.5 12/08/2017   ALKPHOS 45 12/08/2017   AST 30 12/08/2017   ALT 36 12/08/2017   PROT 7.1 12/08/2017   ALBUMIN 4.2 12/08/2017   CALCIUM 9.3 12/08/2017   GFRAA >60 12/08/2017    Speciality Comments: No specialty comments available.  Procedures:  No procedures performed Allergies: Contrast media [iodinated diagnostic agents], Codeine, Morphine and related, and Sulfa antibiotics   Assessment / Plan:     Visit Diagnoses: Primary osteoarthritis of both hands-she has been experiencing some stiffness in her hands.  No synovitis was noted.  Primary osteoarthritis of both knees - s/p hyalgan bilateral knees 09/2018.  She had good response to [place]  supplement injections.  She is having minimal stiffness currently.  Trochanteric bursitis of both hips-she had tenderness over bilateral trochanteric area.  She states she had a flare of trochanteric bursitis which is resolved now.  DDD (degenerative disc disease), lumbar-chronic pain.  Osteopenia of multiple sites - T score -1.4 onSeptember 26, 2018.  She will discuss repeat DEXA with her GYN.  H/O Paget's disease of bone-currently asymptomatic.  History of hypothyroidism  History of coronary artery disease  History of diabetes mellitus  History of breast cancer  History of hypertension  History of hyperlipidemia  History of gastroesophageal reflux (GERD)  Orders: No orders of the defined types were placed in this encounter.  No orders of the defined types were placed in this encounter.    Follow-Up Instructions: Return in 6 months (on 05/09/2019) for Osteoarthritis, DDD, Paget's .   Bo Merino, MD  Note - This record has been created using Editor, commissioning.  Chart creation errors have been sought, but may not always  have been located. Such creation errors do not reflect on  the standard of medical care.

## 2018-11-08 ENCOUNTER — Ambulatory Visit (INDEPENDENT_AMBULATORY_CARE_PROVIDER_SITE_OTHER): Payer: Medicare Other | Admitting: Rheumatology

## 2018-11-08 ENCOUNTER — Encounter: Payer: Self-pay | Admitting: Rheumatology

## 2018-11-08 ENCOUNTER — Other Ambulatory Visit: Payer: Self-pay

## 2018-11-08 VITALS — BP 155/77 | HR 77 | Resp 12 | Ht 61.0 in | Wt 196.8 lb

## 2018-11-08 DIAGNOSIS — M7061 Trochanteric bursitis, right hip: Secondary | ICD-10-CM

## 2018-11-08 DIAGNOSIS — M19041 Primary osteoarthritis, right hand: Secondary | ICD-10-CM

## 2018-11-08 DIAGNOSIS — Z8639 Personal history of other endocrine, nutritional and metabolic disease: Secondary | ICD-10-CM

## 2018-11-08 DIAGNOSIS — Z8739 Personal history of other diseases of the musculoskeletal system and connective tissue: Secondary | ICD-10-CM

## 2018-11-08 DIAGNOSIS — Z8679 Personal history of other diseases of the circulatory system: Secondary | ICD-10-CM

## 2018-11-08 DIAGNOSIS — M19042 Primary osteoarthritis, left hand: Secondary | ICD-10-CM | POA: Diagnosis not present

## 2018-11-08 DIAGNOSIS — M8589 Other specified disorders of bone density and structure, multiple sites: Secondary | ICD-10-CM

## 2018-11-08 DIAGNOSIS — Z8719 Personal history of other diseases of the digestive system: Secondary | ICD-10-CM

## 2018-11-08 DIAGNOSIS — M7062 Trochanteric bursitis, left hip: Secondary | ICD-10-CM

## 2018-11-08 DIAGNOSIS — M170 Bilateral primary osteoarthritis of knee: Secondary | ICD-10-CM | POA: Diagnosis not present

## 2018-11-08 DIAGNOSIS — M5136 Other intervertebral disc degeneration, lumbar region: Secondary | ICD-10-CM | POA: Diagnosis not present

## 2018-11-08 DIAGNOSIS — Z853 Personal history of malignant neoplasm of breast: Secondary | ICD-10-CM

## 2019-01-21 ENCOUNTER — Other Ambulatory Visit: Payer: Self-pay

## 2019-01-21 ENCOUNTER — Emergency Department: Payer: Medicare Other

## 2019-01-21 ENCOUNTER — Emergency Department
Admission: EM | Admit: 2019-01-21 | Discharge: 2019-01-22 | Disposition: A | Discharge status: Home / Self Care | Payer: Medicare Other | Attending: Emergency Medicine | Admitting: Emergency Medicine

## 2019-01-21 DIAGNOSIS — N3000 Acute cystitis without hematuria: Secondary | ICD-10-CM | POA: Insufficient documentation

## 2019-01-21 DIAGNOSIS — R5383 Other fatigue: Secondary | ICD-10-CM | POA: Insufficient documentation

## 2019-01-21 DIAGNOSIS — Z8673 Personal history of transient ischemic attack (TIA), and cerebral infarction without residual deficits: Secondary | ICD-10-CM | POA: Diagnosis not present

## 2019-01-21 DIAGNOSIS — Z79899 Other long term (current) drug therapy: Secondary | ICD-10-CM | POA: Insufficient documentation

## 2019-01-21 DIAGNOSIS — I10 Essential (primary) hypertension: Secondary | ICD-10-CM | POA: Diagnosis not present

## 2019-01-21 DIAGNOSIS — I252 Old myocardial infarction: Secondary | ICD-10-CM | POA: Insufficient documentation

## 2019-01-21 DIAGNOSIS — E119 Type 2 diabetes mellitus without complications: Secondary | ICD-10-CM | POA: Diagnosis not present

## 2019-01-21 DIAGNOSIS — R55 Syncope and collapse: Secondary | ICD-10-CM | POA: Diagnosis not present

## 2019-01-21 DIAGNOSIS — I2510 Atherosclerotic heart disease of native coronary artery without angina pectoris: Secondary | ICD-10-CM | POA: Diagnosis not present

## 2019-01-21 DIAGNOSIS — E039 Hypothyroidism, unspecified: Secondary | ICD-10-CM | POA: Diagnosis not present

## 2019-01-21 DIAGNOSIS — Z7982 Long term (current) use of aspirin: Secondary | ICD-10-CM | POA: Diagnosis not present

## 2019-01-21 DIAGNOSIS — Z7984 Long term (current) use of oral hypoglycemic drugs: Secondary | ICD-10-CM | POA: Insufficient documentation

## 2019-01-21 DIAGNOSIS — Z20822 Contact with and (suspected) exposure to covid-19: Secondary | ICD-10-CM | POA: Diagnosis not present

## 2019-01-21 LAB — CBC
HCT: 41.3 % (ref 36.0–46.0)
Hemoglobin: 13.5 g/dL (ref 12.0–15.0)
MCH: 27.6 pg (ref 26.0–34.0)
MCHC: 32.7 g/dL (ref 30.0–36.0)
MCV: 84.3 fL (ref 80.0–100.0)
Platelets: 214 10*3/uL (ref 150–400)
RBC: 4.9 MIL/uL (ref 3.87–5.11)
RDW: 14.1 % (ref 11.5–15.5)
WBC: 11.8 10*3/uL — ABNORMAL HIGH (ref 4.0–10.5)
nRBC: 0 % (ref 0.0–0.2)

## 2019-01-21 LAB — BASIC METABOLIC PANEL
Anion gap: 14 (ref 5–15)
BUN: 28 mg/dL — ABNORMAL HIGH (ref 8–23)
CO2: 21 mmol/L — ABNORMAL LOW (ref 22–32)
Calcium: 9.4 mg/dL (ref 8.9–10.3)
Chloride: 103 mmol/L (ref 98–111)
Creatinine, Ser: 1.17 mg/dL — ABNORMAL HIGH (ref 0.44–1.00)
GFR calc Af Amer: 55 mL/min — ABNORMAL LOW (ref >60)
GFR calc non Af Amer: 48 mL/min — ABNORMAL LOW (ref >60)
Glucose, Bld: 151 mg/dL — ABNORMAL HIGH (ref 70–99)
Potassium: 4 mmol/L (ref 3.5–5.1)
Sodium: 138 mmol/L (ref 135–145)

## 2019-01-21 NOTE — ED Triage Notes (Signed)
Patient reports feeling "werid"  States her eyes are not working right and that she feels nauseated.

## 2019-01-21 NOTE — ED Triage Notes (Signed)
First Nurse: patient brought in by ems from home. Patient told ems that she just doesn't feel right. Patient states that she had a coughing spell and one episode of diarrhea. Vs for ems bp 118/92, hr 76 and temp 98.3. fsbs 126.

## 2019-01-21 NOTE — ED Notes (Signed)
Pt states she had some vision changes earlier while watching tv, unable to see, pt now states vision has returned to normal, denies ever having headache, NIH 0 at this time. Pt c/o n/v.

## 2019-01-22 ENCOUNTER — Emergency Department: Payer: Medicare Other

## 2019-01-22 DIAGNOSIS — R5383 Other fatigue: Secondary | ICD-10-CM | POA: Diagnosis not present

## 2019-01-22 LAB — URINALYSIS, COMPLETE (UACMP) WITH MICROSCOPIC
Bilirubin Urine: NEGATIVE
Glucose, UA: NEGATIVE mg/dL
Ketones, ur: 5 mg/dL — AB
Nitrite: NEGATIVE
Protein, ur: 30 mg/dL — AB
Specific Gravity, Urine: 1.026 (ref 1.005–1.030)
pH: 5 (ref 5.0–8.0)

## 2019-01-22 LAB — SARS CORONAVIRUS 2 (TAT 6-24 HRS): SARS Coronavirus 2: NEGATIVE

## 2019-01-22 LAB — POC SARS CORONAVIRUS 2 AG: SARS Coronavirus 2 Ag: NEGATIVE

## 2019-01-22 MED ORDER — BENZONATATE 100 MG PO CAPS
100.0000 mg | ORAL_CAPSULE | Freq: Once | ORAL | Status: AC
  Administered 2019-01-22: 100 mg via ORAL
  Filled 2019-01-22: qty 1

## 2019-01-22 MED ORDER — SODIUM CHLORIDE 0.9 % IV SOLN
1.0000 g | Freq: Once | INTRAVENOUS | Status: AC
  Administered 2019-01-22: 1 g via INTRAVENOUS
  Filled 2019-01-22: qty 10

## 2019-01-22 MED ORDER — CEPHALEXIN 500 MG PO CAPS: 500.0000 mg | ORAL_CAPSULE | Freq: Two times a day (BID) | ORAL | 0 refills | Status: AC

## 2019-01-22 MED ORDER — SODIUM CHLORIDE 0.9 % IV BOLUS
1000.0000 mL | Freq: Once | INTRAVENOUS | Status: AC
  Administered 2019-01-22: 1000 mL via INTRAVENOUS

## 2019-01-22 NOTE — ED Provider Notes (Addendum)
[facility] Emergency Department Provider Note  ____________________________________________   First MD Initiated Contact with Patient 01/21/19 2355     (approximate)  I have reviewed the triage vital signs and the nursing notes.   HISTORY  Chief Complaint Fatigue   HPI Brittney Ray is a 69 y.o. female with below list of previous medical conditions presents to the emergency department secondary to "feeling weird" tonight.  Patient states that "her eyes were not working right tonight and that she was nauseated.  Patient admits to fainting and one episode of diarrhea tonight.  Patient afebrile on presentation.  Patient denies any vomiting.        Past Medical History:  Diagnosis Date  . Anxiety    panic attacks; no curren med.  . Arthritis    knees  . Breast cancer ([place]) 2014   RT LUMPECTOMY DCIS  . Diabetes mellitus    NIDDM  . GERD (gastroesophageal reflux disease)   . H/O hiatal hernia   . Heart murmur    states never had any problems  . Hypertension    under control, has been on med. > 10 yrs.  . Hypothyroidism   . Medial meniscus tear 04/2011   right knee  . Nocturia    3-4 x/night  . Paget's bone disease    hips  . Personal history of radiation therapy 2014   right BREAST CA  . PONV (postoperative nausea and vomiting)     Patient Active Problem List   Diagnosis Date Noted  . TIA (transient ischemic attack) 12/03/2017  . Primary osteoarthritis of both hands 08/30/2017  . Trochanteric bursitis of both hips 08/30/2017  . DDD (degenerative disc disease), lumbar 08/30/2017  . H/O Paget's disease of bone 08/30/2017  . Osteopenia of neck of right femur 03/04/2017  . Primary osteoarthritis of both knees 01/10/2016  . Chronic pain of both knees 01/10/2016  . Obesity (BMI 30-39.9) 03/25/2015  . Multinodular goiter 03/04/2015  . Vaccine counseling 10/24/2014  . Postprocedural hypothyroidism 10/08/2014  . Type 2 diabetes  mellitus without complication, without long-term current use of insulin ([place]) 10/08/2014  . Breast cancer in situ 08/30/2014  . Coronary artery disease 08/17/2013  . NSTEMI (non-ST elevated myocardial infarction) ([place]) 08/17/2013  . Acquired hypothyroidism 02/13/2013  . Diabetes mellitus type 2, uncomplicated ([place]) 123XX123  . Essential hypertension 02/13/2013  . Mixed hyperlipidemia 02/13/2013  . Personal history of breast cancer 02/13/2013    Past Surgical History:  Procedure Laterality Date  . ABDOMINAL HYSTERECTOMY  1978   partial  . BACK SURGERY  04/1990, 1996  . BREAST BIOPSY Right 03/14/2012   2 areas UOQ of DCIS  . BREAST CYST ASPIRATION Left   . BREAST LUMPECTOMY Right 03/29/2012   right breast DCIS of two areas. Clear margins  . [place] PROCEDURE  09/1989   with rectal repair  . CHOLECYSTECTOMY  11/2005  . FOOT SURGERY  1992   left  . HAND SURGERY  10/1999   nerve repair right thumb  . KNEE SURGERY  07/31/2009   right  . KNEE SURGERY  2009   left  . THYROID LOBECTOMY  03/1992   left    Prior to Admission medications   Medication Sig Start Date End Date Taking? Authorizing Provider  ACCU-CHEK AVIVA PLUS test strip USE 1 STRIP TO CHECK GLUCOSE THREE TIMES DAILY 12/03/17   [provider]  aspirin 325 MG tablet Take 325 mg by mouth daily.  [provider]  Calcium Carb-Cholecalciferol (CALCIUM 600/VITAMIN D3) 600-800 MG-UNIT TABS Take 1 tablet by mouth daily with breakfast.     [provider]  cephALEXin (KEFLEX) 500 MG capsule Take 1 capsule (500 mg total) by mouth 2 (two) times daily for 7 days. 01/22/19 01/29/19  Gregor Hams, MD  fenofibrate 160 MG tablet Take 1 tablet (160 mg total) by mouth daily. 12/05/17   Salary, Holly Bodily D, MD  ferrous sulfate 325 (65 FE) MG tablet Take 325 mg by mouth daily.     [provider]  glimepiride (AMARYL) 1 MG tablet Take 1 mg by mouth at bedtime.     [provider]  Glucose  Blood (ACCU-CHEK AVIVA PLUS VI) 1 each as directed 07/25/14   [provider]  levothyroxine (SYNTHROID, LEVOTHROID) 50 MCG tablet Take 50 mcg by mouth daily.     [provider]  lisinopril-hydrochlorothiazide (ZESTORETIC) 20-25 MG tablet Take by mouth. 10/26/18 10/26/19  [provider]  metFORMIN (GLUCOPHAGE) 500 MG tablet Take 1,000 mg by mouth 2 (two) times daily.     [provider]  metoprolol tartrate (LOPRESSOR) 25 MG tablet Take 12.5 mg by mouth 2 (two) times daily.     [provider]  nitroGLYCERIN (NITROSTAT) 0.4 MG SL tablet Place 0.4 mg under the tongue every 5 (five) minutes as needed for chest pain (up to 3 doses).     [provider]  pantoprazole (PROTONIX) 40 MG tablet Take 40 mg by mouth daily.     [provider]  pravastatin (PRAVACHOL) 20 MG tablet Take 20 mg by mouth daily.     [provider]    Allergies Contrast media [iodinated diagnostic agents], Codeine, Morphine and related, and Sulfa antibiotics  Family History  Problem Relation Age of Onset  . Breast cancer Sister 52  . Breast cancer Cousin        maternal side 1st cousin    Social History Social History   Tobacco Use  . Smoking status: Never Smoker  . Smokeless tobacco: Never Used  Substance Use Topics  . Alcohol use: No  . Drug use: Never    Review of Systems Constitutional: No fever/chills Eyes: No visual changes. ENT: No sore throat. Cardiovascular: Denies chest pain. Respiratory: Denies shortness of breath. Gastrointestinal: No abdominal pain.  No nausea, no vomiting.  Positive for diarrhea.  No constipation. Genitourinary: Negative for dysuria. Musculoskeletal: Negative for neck pain.  Negative for back pain. Integumentary: Negative for rash. Neurological: Negative for headaches, focal weakness or numbness.   ____________________________________________   PHYSICAL EXAM:  VITAL SIGNS: ED Triage Vitals  Enc  Vitals Group     BP 01/21/19 2342 (!) 178/67     Pulse Rate 01/21/19 2342 72     Resp 01/21/19 2342 15     Temp 01/21/19 2342 98.2 F (36.8 C)     Temp Source 01/21/19 2342 Oral     SpO2 01/21/19 2342 100 %     Weight 01/21/19 2225 89.4 kg (197 lb)     Height 01/21/19 2225 1.549 m (5\' 1" )     Head Circumference --      Peak Flow --      Pain Score 01/21/19 2225 0     Pain Loc --      Pain Edu? --      Excl. in [place]? --     Constitutional: Alert and oriented.  Eyes: Conjunctivae are normal.  Mouth/Throat: Patient is wearing  a mask. Neck: No stridor.  No meningeal signs.   Cardiovascular: Normal rate, regular rhythm. Good peripheral circulation. Grossly normal heart sounds. Respiratory: Normal respiratory effort.  No retractions. Gastrointestinal: Soft and nontender. No distention.  Musculoskeletal: No lower extremity tenderness nor edema. No gross deformities of extremities. Neurologic:  Normal speech and language. No gross focal neurologic deficits are appreciated.  Skin:  Skin is warm, dry and intact. Psychiatric: Mood and affect are normal. Speech and behavior are normal.  ____________________________________________   LABS (all labs ordered are listed, but only abnormal results are displayed)  Labs Reviewed  BASIC METABOLIC PANEL - Abnormal; Notable for the following components:      Result Value   CO2 21 (*)    Glucose, Bld 151 (*)    BUN 28 (*)    Creatinine, Ser 1.17 (*)    GFR calc non Af Amer 48 (*)    GFR calc Af Amer 55 (*)    All other components within normal limits  CBC - Abnormal; Notable for the following components:   WBC 11.8 (*)    All other components within normal limits  URINALYSIS, COMPLETE (UACMP) WITH MICROSCOPIC - Abnormal; Notable for the following components:   Color, Urine AMBER (*)    APPearance CLOUDY (*)    Hgb urine dipstick SMALL (*)    Ketones, ur 5 (*)    Protein, ur 30 (*)    Leukocytes,Ua TRACE (*)    Bacteria, UA MANY (*)     All other components within normal limits  SARS CORONAVIRUS 2 (TAT 6-24 HRS)  URINE CULTURE  POC SARS CORONAVIRUS 2 AG -  ED  POC SARS CORONAVIRUS 2 AG   ____________________________________________  EKG  ED ECG REPORT I, [place] N Filemon Breton, the attending physician, personally viewed and interpreted this ECG.   Date: 01/21/2019  EKG Time: 10:34 PM  Rate: 86  Rhythm: Normal sinus rhythm  Axis: Normal  Intervals: Normal  ST&T Change: None  ____________________________________________  RADIOLOGY I, [place] N Teriann Livingood, personally viewed and evaluated these images (plain radiographs) as part of my medical decision making, as well as reviewing the written report by the radiologist.  ED MD interpretation: CT head revealed no acute intracranial abnormality  CT of the renal study revealed no acute abnormality of the abdomen pelvis Official radiology report(s): CT Head Wo Contrast  Result Date: 01/21/2019 CLINICAL DATA:  Headache EXAM: CT HEAD WITHOUT CONTRAST TECHNIQUE: Contiguous axial images were obtained from the base of the skull through the vertex without intravenous contrast. COMPARISON:  December 03, 2017 FINDINGS: Brain: No evidence of acute territorial infarction, hemorrhage, hydrocephalus,extra-axial collection or mass lesion/mass effect. There is mild dilatation the ventricles and sulci consistent with age-related atrophy. Low-attenuation changes in the deep white matter consistent with small vessel ischemia. Vascular: No hyperdense vessel or unexpected calcification. Skull: The skull is intact. No fracture or focal lesion identified. Sinuses/Orbits: The visualized paranasal sinuses and mastoid air cells are clear. The orbits and globes intact. Other: None IMPRESSION: No acute intracranial abnormality. Findings consistent with mild age related atrophy and chronic small vessel ischemia Electronically Signed   By: Prudencio Pair M.D.   On: 01/21/2019 23:35   CT Renal Stone Study  Result  Date: 01/22/2019 CLINICAL DATA:  Nausea and vomiting EXAM: CT ABDOMEN AND PELVIS WITHOUT CONTRAST TECHNIQUE: Multidetector CT imaging of the abdomen and pelvis was performed following the standard protocol without IV contrast. COMPARISON:  None. FINDINGS: LOWER CHEST: No basilar pleural or apical pericardial  effusion. HEPATOBILIARY: Diffuse hypoattenuation of the liver relative to the spleen suggests hepatic steatosis. No focal liver lesion or biliary dilatation. Status post cholecystectomy. PANCREAS: Normal pancreas. No ductal dilatation or peripancreatic fluid collection. SPLEEN: Normal. ADRENALS/URINARY TRACT: The adrenal glands are normal. No hydronephrosis, nephroureterolithiasis or solid renal mass. The urinary bladder is normal for degree of distention STOMACH/BOWEL: There is no hiatal hernia. Normal duodenal course and caliber. No small bowel dilatation or inflammation. Rectosigmoid diverticulosis without acute inflammation. Not visualized. No right lower quadrant inflammation or free fluid. VASCULAR/LYMPHATIC: There is calcific atherosclerosis of the abdominal aorta. No abdominal or pelvic lymphadenopathy. REPRODUCTIVE: No free fluid in the pelvis. MUSCULOSKELETAL. Unchanged L2 hemangioma OTHER: None. IMPRESSION: 1. No acute abnormality of the abdomen or pelvis. 2. Hepatic steatosis. 3. Aortic Atherosclerosis (ICD10-I70.0). Electronically Signed   By: Ulyses Jarred M.D.   On: 01/22/2019 02:08      Procedures   ____________________________________________   INITIAL IMPRESSION / MDM / ASSESSMENT AND PLAN / ED COURSE  As part of my medical decision making, I reviewed the following data within the electronic MEDICAL RECORD NUMBER   69 year old female presenting with above-stated history and physical exam notable abnormality on laboratory data include evidence of urinary tract infection, creatinine elevated at 1.17 with a BUN of 28 patient received 1 L IV normal saline.  White blood cell count 11.8.   Patient did receive 1 g IV ceftriaxone regarding UTI will be prescribed Keflex for home.  CT head revealed no acute abnormality.       ____________________________________________  FINAL CLINICAL IMPRESSION(S) / ED DIAGNOSES  Final diagnoses:  Other fatigue  Acute cystitis without hematuria     MEDICATIONS GIVEN DURING THIS VISIT:  Medications  benzonatate (TESSALON) capsule 100 mg (100 mg Oral Given 01/22/19 0006)  sodium chloride 0.9 % bolus 1,000 mL (0 mLs Intravenous Stopped 01/22/19 0252)  cefTRIAXone (ROCEPHIN) 1 g in sodium chloride 0.9 % 100 mL IVPB (0 g Intravenous Stopped 01/22/19 0235)     ED Discharge Orders         Ordered    cephALEXin (KEFLEX) 500 MG capsule  2 times daily     01/22/19 0309          *Please note:  Brittney Ray was evaluated in Emergency Department on 01/22/2019 for the symptoms described in the history of present illness. She was evaluated in the context of the global COVID-19 pandemic, which necessitated consideration that the patient might be at risk for infection with the SARS-CoV-2 virus that causes COVID-19. Institutional protocols and algorithms that pertain to the evaluation of patients at risk for COVID-19 are in a state of rapid change based on information released by regulatory bodies including the CDC and federal and state organizations. These policies and algorithms were followed during the patient's care in the ED.  Some ED evaluations and interventions may be delayed as a result of limited staffing during the pandemic.*  Note:  This document was prepared using Dragon voice recognition software and may include unintentional dictation errors.   Gregor Hams, MD 01/22/19 [id number]    Gregor Hams, MD 01/22/19 [phone]

## 2019-01-23 LAB — URINE CULTURE

## 2019-04-18 ENCOUNTER — Other Ambulatory Visit: Payer: Self-pay | Admitting: Family Medicine

## 2019-04-18 DIAGNOSIS — Z1231 Encounter for screening mammogram for malignant neoplasm of breast: Secondary | ICD-10-CM

## 2019-05-02 NOTE — Progress Notes (Deleted)
Office Visit Note  Patient: Brittney Ray             Date of Birth: Sep 19, 1950           MRN: [id number]             PCP: Dion Body, MD Referring: Dion Body, MD Visit Date: 05/09/2019 Occupation: @GUAROCC @  Subjective:  No chief complaint on file.   History of Present Illness: Brittney Ray is a 69 y.o. female ***   Activities of Daily Living:  Patient reports morning stiffness for *** {minute/hour:19697}.   Patient {ACTIONS;DENIES/REPORTS:21021675::"Denies"} nocturnal pain.  Difficulty dressing/grooming: {ACTIONS;DENIES/REPORTS:21021675::"Denies"} Difficulty climbing stairs: {ACTIONS;DENIES/REPORTS:21021675::"Denies"} Difficulty getting out of chair: {ACTIONS;DENIES/REPORTS:21021675::"Denies"} Difficulty using hands for taps, buttons, cutlery, and/or writing: {ACTIONS;DENIES/REPORTS:21021675::"Denies"}  No Rheumatology ROS completed.   PMFS History:  Patient Active Problem List   Diagnosis Date Noted  . TIA (transient ischemic attack) 12/03/2017  . Primary osteoarthritis of both hands 08/30/2017  . Trochanteric bursitis of both hips 08/30/2017  . DDD (degenerative disc disease), lumbar 08/30/2017  . H/O Paget's disease of bone 08/30/2017  . Osteopenia of neck of right femur 03/04/2017  . Primary osteoarthritis of both knees 01/10/2016  . Chronic pain of both knees 01/10/2016  . Obesity (BMI 30-39.9) 03/25/2015  . Multinodular goiter 03/04/2015  . Vaccine counseling 10/24/2014  . Postprocedural hypothyroidism 10/08/2014  . Type 2 diabetes mellitus without complication, without long-term current use of insulin ([place]) 10/08/2014  . Breast cancer in situ 08/30/2014  . Coronary artery disease 08/17/2013  . NSTEMI (non-ST elevated myocardial infarction) ([place]) 08/17/2013  . Acquired hypothyroidism 02/13/2013  . Diabetes mellitus type 2, uncomplicated ([place]) [id number]  . Essential hypertension 02/13/2013  . Mixed hyperlipidemia 02/13/2013  .  Personal history of breast cancer 02/13/2013    Past Medical History:  Diagnosis Date  . Anxiety    panic attacks; no curren med.  . Arthritis    knees  . Breast cancer ([place]) 2014   RT LUMPECTOMY DCIS  . Diabetes mellitus    NIDDM  . GERD (gastroesophageal reflux disease)   . H/O hiatal hernia   . Heart murmur    states never had any problems  . Hypertension    under control, has been on med. > 10 yrs.  . Hypothyroidism   . Medial meniscus tear 04/2011   right knee  . Nocturia    3-4 x/night  . Paget's bone disease    hips  . Personal history of radiation therapy 2014   right BREAST CA  . PONV (postoperative nausea and vomiting)     Family History  Problem Relation Age of Onset  . Breast cancer Sister 36  . Breast cancer Cousin        maternal side 1st cousin   Past Surgical History:  Procedure Laterality Date  . ABDOMINAL HYSTERECTOMY  1978   partial  . BACK SURGERY  04/1990, 1996  . BREAST BIOPSY Right 03/14/2012   2 areas UOQ of DCIS  . BREAST CYST ASPIRATION Left   . BREAST LUMPECTOMY Right 03/29/2012   right breast DCIS of two areas. Clear margins  . [place] PROCEDURE  09/1989   with rectal repair  . CHOLECYSTECTOMY  11/2005  . FOOT SURGERY  1992   left  . HAND SURGERY  10/1999   nerve repair right thumb  . KNEE SURGERY  07/31/2009   right  . KNEE SURGERY  2009   left  . THYROID LOBECTOMY  03/1992   left  Social History   Social History Narrative  . Not on file   Immunization History  Administered Date(s) Administered  . Influenza-Unspecified 09/12/2013     Objective: Vital Signs: There were no vitals taken for this visit.   Physical Exam   Musculoskeletal Exam: ***  CDAI Exam: CDAI Score: -- Patient Global: --; Provider Global: -- Swollen: --; Tender: -- Joint Exam 05/09/2019   No joint exam has been documented for this visit   There is currently no information documented on the homunculus. Go to the Rheumatology activity and  complete the homunculus joint exam.  Investigation: No additional findings.  Imaging: No results found.  Recent Labs: Lab Results  Component Value Date   WBC 11.8 (H) 01/21/2019   HGB 13.5 01/21/2019   PLT 214 01/21/2019   NA 138 01/21/2019   K 4.0 01/21/2019   CL 103 01/21/2019   CO2 21 (L) 01/21/2019   GLUCOSE 151 (H) 01/21/2019   BUN 28 (H) 01/21/2019   CREATININE 1.17 (H) 01/21/2019   BILITOT 0.5 12/08/2017   ALKPHOS 45 12/08/2017   AST 30 12/08/2017   ALT 36 12/08/2017   PROT 7.1 12/08/2017   ALBUMIN 4.2 12/08/2017   CALCIUM 9.4 01/21/2019   GFRAA 55 (L) 01/21/2019    Speciality Comments: No specialty comments available.  Procedures:  No procedures performed Allergies: Contrast media [iodinated diagnostic agents], Codeine, Morphine and related, and Sulfa antibiotics   Assessment / Plan:     Visit Diagnoses: No diagnosis found.  Orders: No orders of the defined types were placed in this encounter.  No orders of the defined types were placed in this encounter.   Face-to-face time spent with patient was *** minutes. Greater than 50% of time was spent in counseling and coordination of care.  Follow-Up Instructions: No follow-ups on file.   Ofilia Neas, PA-C  Note - This record has been created using Dragon software.  Chart creation errors have been sought, but may not always  have been located. Such creation errors do not reflect on  the standard of medical care.

## 2019-05-09 ENCOUNTER — Ambulatory Visit: Payer: No Typology Code available for payment source | Admitting: Rheumatology

## 2019-05-09 DIAGNOSIS — M7061 Trochanteric bursitis, right hip: Secondary | ICD-10-CM

## 2019-05-09 DIAGNOSIS — M170 Bilateral primary osteoarthritis of knee: Secondary | ICD-10-CM

## 2019-05-09 DIAGNOSIS — Z8639 Personal history of other endocrine, nutritional and metabolic disease: Secondary | ICD-10-CM

## 2019-05-09 DIAGNOSIS — Z8719 Personal history of other diseases of the digestive system: Secondary | ICD-10-CM

## 2019-05-09 DIAGNOSIS — Z8739 Personal history of other diseases of the musculoskeletal system and connective tissue: Secondary | ICD-10-CM

## 2019-05-09 DIAGNOSIS — M19041 Primary osteoarthritis, right hand: Secondary | ICD-10-CM

## 2019-05-09 DIAGNOSIS — M8589 Other specified disorders of bone density and structure, multiple sites: Secondary | ICD-10-CM

## 2019-05-09 DIAGNOSIS — Z853 Personal history of malignant neoplasm of breast: Secondary | ICD-10-CM

## 2019-05-09 DIAGNOSIS — Z8679 Personal history of other diseases of the circulatory system: Secondary | ICD-10-CM

## 2019-05-09 DIAGNOSIS — M5136 Other intervertebral disc degeneration, lumbar region: Secondary | ICD-10-CM

## 2019-05-22 ENCOUNTER — Ambulatory Visit
Admission: RE | Admit: 2019-05-22 | Discharge: 2019-05-22 | Disposition: A | Discharge status: Home / Self Care | Payer: Medicare Other | Source: Ambulatory Visit | Attending: Family Medicine | Admitting: Family Medicine

## 2019-05-22 DIAGNOSIS — Z1231 Encounter for screening mammogram for malignant neoplasm of breast: Secondary | ICD-10-CM | POA: Diagnosis present

## 2019-09-11 ENCOUNTER — Other Ambulatory Visit: Payer: Self-pay

## 2019-09-11 ENCOUNTER — Ambulatory Visit: Payer: No Typology Code available for payment source | Admitting: Rheumatology

## 2019-09-11 ENCOUNTER — Encounter: Payer: Self-pay | Admitting: Physician Assistant

## 2019-09-11 ENCOUNTER — Ambulatory Visit: Payer: Self-pay

## 2019-09-11 VITALS — BP 123/75 | HR 89 | Resp 17 | Ht 61.0 in | Wt 197.0 lb

## 2019-09-11 DIAGNOSIS — Z8639 Personal history of other endocrine, nutritional and metabolic disease: Secondary | ICD-10-CM

## 2019-09-11 DIAGNOSIS — M25552 Pain in left hip: Secondary | ICD-10-CM

## 2019-09-11 DIAGNOSIS — M170 Bilateral primary osteoarthritis of knee: Secondary | ICD-10-CM | POA: Diagnosis not present

## 2019-09-11 DIAGNOSIS — Z8739 Personal history of other diseases of the musculoskeletal system and connective tissue: Secondary | ICD-10-CM

## 2019-09-11 DIAGNOSIS — M5136 Other intervertebral disc degeneration, lumbar region: Secondary | ICD-10-CM

## 2019-09-11 DIAGNOSIS — M19041 Primary osteoarthritis, right hand: Secondary | ICD-10-CM | POA: Diagnosis not present

## 2019-09-11 DIAGNOSIS — Z853 Personal history of malignant neoplasm of breast: Secondary | ICD-10-CM

## 2019-09-11 DIAGNOSIS — M7061 Trochanteric bursitis, right hip: Secondary | ICD-10-CM

## 2019-09-11 DIAGNOSIS — Z8679 Personal history of other diseases of the circulatory system: Secondary | ICD-10-CM

## 2019-09-11 DIAGNOSIS — M8589 Other specified disorders of bone density and structure, multiple sites: Secondary | ICD-10-CM

## 2019-09-11 DIAGNOSIS — M7062 Trochanteric bursitis, left hip: Secondary | ICD-10-CM

## 2019-09-11 DIAGNOSIS — Z8719 Personal history of other diseases of the digestive system: Secondary | ICD-10-CM

## 2019-09-11 DIAGNOSIS — M19042 Primary osteoarthritis, left hand: Secondary | ICD-10-CM

## 2019-09-11 NOTE — Patient Instructions (Signed)
Iliotibial Band Syndrome Rehab Ask your health care provider which exercises are safe for you. Do exercises exactly as told by your health care provider and adjust them as directed. It is normal to feel mild stretching, pulling, tightness, or discomfort as you do these exercises. Stop right away if you feel sudden pain or your pain gets significantly worse. Do not begin these exercises until told by your health care provider. Stretching and range-of-motion exercises These exercises warm up your muscles and joints and improve the movement and flexibility of your hip and pelvis. Quadriceps stretch, prone  1. Lie on your abdomen on a firm surface, such as a bed or padded floor (prone position). 2. Bend your left / right knee and reach back to hold your ankle or pant leg. If you cannot reach your ankle or pant leg, loop a belt around your foot and grab the belt instead. 3. Gently pull your heel toward your buttocks. Your knee should not slide out to the side. You should feel a stretch in the front of your thigh and knee (quadriceps). 4. Hold this position for __________ seconds. Repeat __________ times. Complete this exercise __________ times a day. Iliotibial band stretch An iliotibial band is a strong band of muscle tissue that runs from the outer side of your hip to the outer side of your thigh and knee. 1. Lie on your side with your left / right leg in the top position. 2. Bend both of your knees and grab your left / right ankle. Stretch out your bottom arm to help you balance. 3. Slowly bring your top knee back so your thigh goes behind your trunk. 4. Slowly lower your top leg toward the floor until you feel a gentle stretch on the outside of your left / right hip and thigh. If you do not feel a stretch and your knee will not fall farther, place the heel of your other foot on top of your knee and pull your knee down toward the floor with your foot. 5. Hold this position for __________  seconds. Repeat __________ times. Complete this exercise __________ times a day. Strengthening exercises These exercises build strength and endurance in your hip and pelvis. Endurance is the ability to use your muscles for a long time, even after they get tired. Straight leg raises, side-lying This exercise strengthens the muscles that rotate the leg at the hip and move it away from your body (hip abductors). 1. Lie on your side with your left / right leg in the top position. Lie so your head, shoulder, hip, and knee line up. You may bend your bottom knee to help you balance. 2. Roll your hips slightly forward so your hips are stacked directly over each other and your left / right knee is facing forward. 3. Tense the muscles in your outer thigh and lift your top leg 4-6 inches (10-15 cm). 4. Hold this position for __________ seconds. 5. Slowly return to the starting position. Let your muscles relax completely before doing another repetition. Repeat __________ times. Complete this exercise __________ times a day. Leg raises, prone This exercise strengthens the muscles that move the hips (hip extensors). 1. Lie on your abdomen on your bed or a firm surface. You can put a pillow under your hips if that is more comfortable for your lower back. 2. Bend your left / right knee so your foot is straight up in the air. 3. Squeeze your buttocks muscles and lift your left / right thigh   off the bed. Do not let your back arch. 4. Tense your thigh muscle as hard as you can without increasing any knee pain. 5. Hold this position for __________ seconds. 6. Slowly lower your leg to the starting position and allow it to relax completely. Repeat __________ times. Complete this exercise __________ times a day. Hip hike 1. Stand sideways on a bottom step. Stand on your left / right leg with your other foot unsupported next to the step. You can hold on to the railing or wall for balance if needed. 2. Keep your knees  straight and your torso square. Then lift your left / right hip up toward the ceiling. 3. Slowly let your left / right hip lower toward the floor, past the starting position. Your foot should get closer to the floor. Do not lean or bend your knees. Repeat __________ times. Complete this exercise __________ times a day. This information is not intended to replace advice given to you by your health care provider. Make sure you discuss any questions you have with your health care provider. Document Revised: 04/21/2018 Document Reviewed: 10/20/2017 Elsevier Patient Education  2020 [employer]  

## 2019-09-11 NOTE — Progress Notes (Signed)
Office Visit Note  Patient: Brittney Ray             Date of Birth: Jun 26, 1950           MRN: [id number]             PCP: Dion Body, MD Referring: Dion Body, MD Visit Date: 09/11/2019 Occupation: @GUAROCC @  Subjective:  Other (left hip pain )   History of Present Illness: Brittney Ray is a 69 y.o. female with history of osteoarthritis and degenerative disc disease.  She states she has been experiencing severe pain and discomfort in her left hip for the last 3 days.  She is having difficulty walking.  She also has difficulty sleeping on her left side.  There is no history of injury or fall.  She has chronic lower back pain but she denies any radiculopathy.  She continues to have some stiffness in her hands and her knees.  Activities of Daily Living:  Patient reports morning stiffness for 5-10 minutes.   Patient Reports nocturnal pain.  Difficulty dressing/grooming: Reports Difficulty climbing stairs: Reports Difficulty getting out of chair: Reports Difficulty using hands for taps, buttons, cutlery, and/or writing: Denies  Review of Systems  Constitutional: Positive for fatigue.  HENT: Positive for mouth dryness. Negative for mouth sores and nose dryness.   Eyes: Negative for itching and dryness.  Respiratory: Negative for shortness of breath and difficulty breathing.   Cardiovascular: Negative for chest pain and palpitations.  Gastrointestinal: Negative for blood in stool, constipation and diarrhea.  Endocrine: Negative for increased urination.  Genitourinary: Negative for difficulty urinating.  Musculoskeletal: Positive for arthralgias, joint pain, joint swelling, myalgias, morning stiffness, muscle tenderness and myalgias.  Skin: Negative for color change, rash and redness.  Allergic/Immunologic: Negative for susceptible to infections.  Neurological: Negative for dizziness, numbness, headaches, memory loss and weakness.  Hematological:  Positive for bruising/bleeding tendency.  Psychiatric/Behavioral: Positive for sleep disturbance. Negative for confusion.    PMFS History:  Patient Active Problem List   Diagnosis Date Noted  . TIA (transient ischemic attack) 12/03/2017  . Primary osteoarthritis of both hands 08/30/2017  . Trochanteric bursitis of both hips 08/30/2017  . DDD (degenerative disc disease), lumbar 08/30/2017  . H/O Paget's disease of bone 08/30/2017  . Osteopenia of neck of right femur 03/04/2017  . Primary osteoarthritis of both knees 01/10/2016  . Chronic pain of both knees 01/10/2016  . Obesity (BMI 30-39.9) 03/25/2015  . Multinodular goiter 03/04/2015  . Vaccine counseling 10/24/2014  . Postprocedural hypothyroidism 10/08/2014  . Type 2 diabetes mellitus without complication, without long-term current use of insulin ([place]) 10/08/2014  . Breast cancer in situ 08/30/2014  . Coronary artery disease 08/17/2013  . NSTEMI (non-ST elevated myocardial infarction) ([place]) 08/17/2013  . Acquired hypothyroidism 02/13/2013  . Diabetes mellitus type 2, uncomplicated ([place]) 08/25/4816  . Essential hypertension 02/13/2013  . Mixed hyperlipidemia 02/13/2013  . Personal history of breast cancer 02/13/2013    Past Medical History:  Diagnosis Date  . Anxiety    panic attacks; no curren med.  . Arthritis    knees  . Breast cancer ([place]) 2014   RT LUMPECTOMY DCIS  . Diabetes mellitus    NIDDM  . GERD (gastroesophageal reflux disease)   . H/O hiatal hernia   . Heart murmur    states never had any problems  . Hypertension    under control, has been on med. > 10 yrs.  . Hypothyroidism   . Medial meniscus tear  04/2011   right knee  . Nocturia    3-4 x/night  . Paget's bone disease    hips  . Personal history of radiation therapy 2014   right BREAST CA  . PONV (postoperative nausea and vomiting)     Family History  Problem Relation Age of Onset  . Breast cancer Sister 53  . Breast cancer Cousin         maternal side 1st cousin   Past Surgical History:  Procedure Laterality Date  . ABDOMINAL HYSTERECTOMY  1978   partial  . BACK SURGERY  04/1990, 1996  . BREAST BIOPSY Right 03/14/2012   2 areas UOQ of DCIS  . BREAST CYST ASPIRATION Left   . BREAST LUMPECTOMY Right 03/29/2012   right breast DCIS of two areas. Clear margins  . [place] PROCEDURE  09/1989   with rectal repair  . CHOLECYSTECTOMY  11/2005  . FOOT SURGERY  1992   left  . HAND SURGERY  10/1999   nerve repair right thumb  . KNEE SURGERY  07/31/2009   right  . KNEE SURGERY  2009   left  . THYROID LOBECTOMY  03/1992   left   Social History   Social History Narrative  . Not on file   Immunization History  Administered Date(s) Administered  . Influenza-Unspecified 09/12/2013  . PFIZER SARS-COV-2 Vaccination 03/15/2019, 04/05/2019     Objective: Vital Signs: BP 123/75 (BP Location: Left Arm, Patient Position: Sitting, Cuff Size: Normal)   Pulse 89   Resp 17   Ht 5\' 1"  (1.549 m)   Wt 197 lb (89.4 kg)   BMI 37.22 kg/m    Physical Exam Vitals and nursing note reviewed.  Constitutional:      Appearance: She is well-developed.  HENT:     Head: Normocephalic and atraumatic.  Eyes:     Conjunctiva/sclera: Conjunctivae normal.  Cardiovascular:     Rate and Rhythm: Normal rate and regular rhythm.     Heart sounds: Normal heart sounds.  Pulmonary:     Effort: Pulmonary effort is normal.     Breath sounds: Normal breath sounds.  Abdominal:     General: Bowel sounds are normal.     Palpations: Abdomen is soft.  Musculoskeletal:     Cervical back: Normal range of motion.  Lymphadenopathy:     Cervical: No cervical adenopathy.  Skin:    General: Skin is warm and dry.     Capillary Refill: Capillary refill takes less than 2 seconds.  Neurological:     Mental Status: She is alert and oriented to person, place, and time.  Psychiatric:        Behavior: Behavior normal.      Musculoskeletal Exam: C-spine was in  good range of motion.  She has limited painful range of motion of her lower back.  Shoulder joints, elbow joints, wrist joints with good range of motion.  She has bilateral DIP thickening of her hands without any synovitis.  She had painful range of motion of her left hip joint and tenderness on palpation over left trochanteric bursa.  Knee joints in good range of motion without any warmth swelling or effusion.  She had no tenderness across her MTPs.  CDAI Exam: CDAI Score: -- Patient Global: --; Provider Global: -- Swollen: --; Tender: -- Joint Exam 09/11/2019   No joint exam has been documented for this visit   There is currently no information documented on the homunculus. Go to the Rheumatology activity and complete  the homunculus joint exam.  Investigation: No additional findings.  Imaging: XR HIP UNILAT W OR W/O PELVIS 2-3 VIEWS LEFT  Result Date: 09/11/2019 No hip joint narrowing was noted.  Significant cortical thickening of the femoral head, femoral neck, shaft was noted.  No interval change was noted when compared to the x-rays of August 30, 2017. Impression: These findings are consistent with Paget's disease.   Recent Labs: Lab Results  Component Value Date   WBC 11.8 (H) 01/21/2019   HGB 13.5 01/21/2019   PLT 214 01/21/2019   NA 138 01/21/2019   K 4.0 01/21/2019   CL 103 01/21/2019   CO2 21 (L) 01/21/2019   GLUCOSE 151 (H) 01/21/2019   BUN 28 (H) 01/21/2019   CREATININE 1.17 (H) 01/21/2019   BILITOT 0.5 12/08/2017   ALKPHOS 45 12/08/2017   AST 30 12/08/2017   ALT 36 12/08/2017   PROT 7.1 12/08/2017   ALBUMIN 4.2 12/08/2017   CALCIUM 9.4 01/21/2019   GFRAA 55 (L) 01/21/2019    Speciality Comments: No specialty comments available.  Procedures:  No procedures performed Allergies: Contrast media [iodinated diagnostic agents], Codeine, Morphine and related, and Sulfa antibiotics   Assessment / Plan:     Visit Diagnoses: Pain in left hip -she has pain and  discomfort on palpation over her left trochanteric bursa.  She also has discomfort range of motion of her left hip joint.  Plan: XR HIP UNILAT W OR W/O PELVIS 2-3 VIEWS LEFT.  X-ray of the hip joint was consistent with Paget's disease of the left proximal femur.  There was no interval change from the x-rays compared to 2019.  She also had MRI of her left hip at the time.  Trochanteric bursitis of both hips-she had recurrent trochanteric bursitis.  She has severe tenderness on palpation over left trochanteric bursa today.  She had an adequate response to cortisone injection in the past.  We will refer her to physical therapy.  Primary osteoarthritis of both hands-she has bilateral DIP thickening with no synovitis.  Primary osteoarthritis of both knees - s/p hyalgan bilateral knees 09/2018.  She continues to have some discomfort in her knee joint.  DDD (degenerative disc disease), lumbar-she has chronic pain and discomfort in her lower back.  Osteopenia of multiple sites - T score -1.4 onSeptember 26, 2018.  She will discuss repeat DEXA with her GYN.  She states she does not see her GYN anymore.  We will schedule DEXA scan.  H/O Paget's disease of bone-she had Reclast infusion several years ago.  I reviewed MRI from September 2019 which was remarkable for Paget's disease of the proximal left femur but no marrow lesions were identified.  I will obtain CMP and sed rate today.  History of diabetes mellitus  History of hypothyroidism  History of coronary artery disease  History of hypertension  History of breast cancer  History of hyperlipidemia  History of gastroesophageal reflux (GERD)  She is fully vaccinated against COVID-19.  Use of mask, social distancing and hand hygiene was emphasized.  I have also advised her to get a booster when available .  Orders: Orders Placed This Encounter  Procedures  . XR HIP UNILAT W OR W/O PELVIS 2-3 VIEWS LEFT  . DG BONE DENSITY (DXA)  . Sedimentation  rate  . COMPLETE METABOLIC PANEL WITH GFR  . Ambulatory referral to Physical Therapy   No orders of the defined types were placed in this encounter.     Follow-Up Instructions: Return in  about 6 months (around 03/11/2020) for Osteoarthritis, DDD.   Bo Merino, MD  Note - This record has been created using Editor, commissioning.  Chart creation errors have been sought, but may not always  have been located. Such creation errors do not reflect on  the standard of medical care.

## 2019-09-12 LAB — COMPLETE METABOLIC PANEL WITH GFR
AG Ratio: 2.1 (calc) (ref 1.0–2.5)
ALT: 26 U/L (ref 6–29)
AST: 19 U/L (ref 10–35)
Albumin: 4.6 g/dL (ref 3.6–5.1)
Alkaline phosphatase (APISO): 25 U/L — ABNORMAL LOW (ref 37–153)
BUN/Creatinine Ratio: 21 (calc) (ref 6–22)
BUN: 27 mg/dL — ABNORMAL HIGH (ref 7–25)
CO2: 24 mmol/L (ref 20–32)
Calcium: 9.8 mg/dL (ref 8.6–10.4)
Chloride: 100 mmol/L (ref 98–110)
Creat: 1.3 mg/dL — ABNORMAL HIGH (ref 0.60–0.93)
GFR, Est African American: 48 mL/min/{1.73_m2} — ABNORMAL LOW (ref >=60)
GFR, Est Non African American: 42 mL/min/{1.73_m2} — ABNORMAL LOW (ref >=60)
Globulin: 2.2 g/dL (calc) (ref 1.9–3.7)
Glucose, Bld: 255 mg/dL — ABNORMAL HIGH (ref 65–99)
Potassium: 3.9 mmol/L (ref 3.5–5.3)
Sodium: 138 mmol/L (ref 135–146)
Total Bilirubin: 0.5 mg/dL (ref 0.2–1.2)
Total Protein: 6.8 g/dL (ref 6.1–8.1)

## 2019-09-12 LAB — SEDIMENTATION RATE: Sed Rate: 6 mm/h (ref 0–30)

## 2019-09-12 NOTE — Progress Notes (Signed)
Please forward labs to her PCP.

## 2019-09-12 NOTE — Progress Notes (Signed)
Sed rate is normal, glucose is elevated, creatinine is higher.  Alkaline phosphatase is not elevated, which indicates that Paget's is not active.  We are not prescribing any medications.

## 2019-10-02 ENCOUNTER — Other Ambulatory Visit: Payer: Medicare Other

## 2020-02-27 NOTE — Progress Notes (Deleted)
Office Visit Note  Patient: Brittney Ray             Date of Birth: 05-14-1950           MRN: [id number]             PCP: Dion Body, MD Referring: Dion Body, MD Visit Date: 03/12/2020 Occupation: @GUAROCC @  Subjective:  No chief complaint on file.   History of Present Illness: Brittney Ray is a 70 y.o. female ***   Activities of Daily Living:  Patient reports morning stiffness for *** {minute/hour:19697}.   Patient {ACTIONS;DENIES/REPORTS:21021675::"Denies"} nocturnal pain.  Difficulty dressing/grooming: {ACTIONS;DENIES/REPORTS:21021675::"Denies"} Difficulty climbing stairs: {ACTIONS;DENIES/REPORTS:21021675::"Denies"} Difficulty getting out of chair: {ACTIONS;DENIES/REPORTS:21021675::"Denies"} Difficulty using hands for taps, buttons, cutlery, and/or writing: {ACTIONS;DENIES/REPORTS:21021675::"Denies"}  No Rheumatology ROS completed.   PMFS History:  Patient Active Problem List   Diagnosis Date Noted  . TIA (transient ischemic attack) 12/03/2017  . Primary osteoarthritis of both hands 08/30/2017  . Trochanteric bursitis of both hips 08/30/2017  . DDD (degenerative disc disease), lumbar 08/30/2017  . H/O Paget's disease of bone 08/30/2017  . Osteopenia of neck of right femur 03/04/2017  . Primary osteoarthritis of both knees 01/10/2016  . Chronic pain of both knees 01/10/2016  . Obesity (BMI 30-39.9) 03/25/2015  . Multinodular goiter 03/04/2015  . Vaccine counseling 10/24/2014  . Postprocedural hypothyroidism 10/08/2014  . Type 2 diabetes mellitus without complication, without long-term current use of insulin ([place]) 10/08/2014  . Breast cancer in situ 08/30/2014  . Coronary artery disease 08/17/2013  . NSTEMI (non-ST elevated myocardial infarction) ([place]) 08/17/2013  . Acquired hypothyroidism 02/13/2013  . Diabetes mellitus type 2, uncomplicated ([place]) 88/41/6606  . Essential hypertension 02/13/2013  . Mixed hyperlipidemia 02/13/2013  .  Personal history of breast cancer 02/13/2013    Past Medical History:  Diagnosis Date  . Anxiety    panic attacks; no curren med.  . Arthritis    knees  . Breast cancer ([place]) 2014   RT LUMPECTOMY DCIS  . Diabetes mellitus    NIDDM  . GERD (gastroesophageal reflux disease)   . H/O hiatal hernia   . Heart murmur    states never had any problems  . Hypertension    under control, has been on med. > 10 yrs.  . Hypothyroidism   . Medial meniscus tear 04/2011   right knee  . Nocturia    3-4 x/night  . Paget's bone disease    hips  . Personal history of radiation therapy 2014   right BREAST CA  . PONV (postoperative nausea and vomiting)     Family History  Problem Relation Age of Onset  . Breast cancer Sister 71  . Breast cancer Cousin        maternal side 1st cousin   Past Surgical History:  Procedure Laterality Date  . ABDOMINAL HYSTERECTOMY  1978   partial  . BACK SURGERY  04/1990, 1996  . BREAST BIOPSY Right 03/14/2012   2 areas UOQ of DCIS  . BREAST CYST ASPIRATION Left   . BREAST LUMPECTOMY Right 03/29/2012   right breast DCIS of two areas. Clear margins  . [place] PROCEDURE  09/1989   with rectal repair  . CHOLECYSTECTOMY  11/2005  . FOOT SURGERY  1992   left  . HAND SURGERY  10/1999   nerve repair right thumb  . KNEE SURGERY  07/31/2009   right  . KNEE SURGERY  2009   left  . THYROID LOBECTOMY  03/1992   left  Social History   Social History Narrative  . Not on file   Immunization History  Administered Date(s) Administered  . Influenza-Unspecified 09/12/2013  . PFIZER(Purple Top)SARS-COV-2 Vaccination 03/15/2019, 04/05/2019     Objective: Vital Signs: There were no vitals taken for this visit.   Physical Exam   Musculoskeletal Exam: ***  CDAI Exam: CDAI Score: -- Patient Global: --; Provider Global: -- Swollen: --; Tender: -- Joint Exam 03/12/2020   No joint exam has been documented for this visit   There is currently no information  documented on the homunculus. Go to the Rheumatology activity and complete the homunculus joint exam.  Investigation: No additional findings.  Imaging: No results found.  Recent Labs: Lab Results  Component Value Date   WBC 11.8 (H) 01/21/2019   HGB 13.5 01/21/2019   PLT 214 01/21/2019   NA 138 09/11/2019   K 3.9 09/11/2019   CL 100 09/11/2019   CO2 24 09/11/2019   GLUCOSE 255 (H) 09/11/2019   BUN 27 (H) 09/11/2019   CREATININE 1.30 (H) 09/11/2019   BILITOT 0.5 09/11/2019   ALKPHOS 45 12/08/2017   AST 19 09/11/2019   ALT 26 09/11/2019   PROT 6.8 09/11/2019   ALBUMIN 4.2 12/08/2017   CALCIUM 9.8 09/11/2019   GFRAA 48 (L) 09/11/2019    Speciality Comments: No specialty comments available.  Procedures:  No procedures performed Allergies: Contrast media [iodinated diagnostic agents], Codeine, Morphine and related, and Sulfa antibiotics   Assessment / Plan:     Visit Diagnoses: Primary osteoarthritis of both hands  Primary osteoarthritis of both knees  Trochanteric bursitis of both hips  DDD (degenerative disc disease), lumbar  Osteopenia of multiple sites  H/O Paget's disease of bone  History of diabetes mellitus  History of hypothyroidism  History of coronary artery disease  History of hypertension  History of breast cancer  History of hyperlipidemia  History of gastroesophageal reflux (GERD)  Orders: No orders of the defined types were placed in this encounter.  No orders of the defined types were placed in this encounter.   Face-to-face time spent with patient was *** minutes. Greater than 50% of time was spent in counseling and coordination of care.  Follow-Up Instructions: No follow-ups on file.   Brittney Neas, PA-C  Note - This record has been created using Dragon software.  Chart creation errors have been sought, but may not always  have been located. Such creation errors do not reflect on  the standard of medical care.

## 2020-03-12 ENCOUNTER — Ambulatory Visit: Payer: Medicare Other | Admitting: Physician Assistant

## 2020-03-12 DIAGNOSIS — Z853 Personal history of malignant neoplasm of breast: Secondary | ICD-10-CM

## 2020-03-12 DIAGNOSIS — M5136 Other intervertebral disc degeneration, lumbar region: Secondary | ICD-10-CM

## 2020-03-12 DIAGNOSIS — Z8739 Personal history of other diseases of the musculoskeletal system and connective tissue: Secondary | ICD-10-CM

## 2020-03-12 DIAGNOSIS — M19041 Primary osteoarthritis, right hand: Secondary | ICD-10-CM

## 2020-03-12 DIAGNOSIS — M170 Bilateral primary osteoarthritis of knee: Secondary | ICD-10-CM

## 2020-03-12 DIAGNOSIS — M7061 Trochanteric bursitis, right hip: Secondary | ICD-10-CM

## 2020-03-12 DIAGNOSIS — Z8639 Personal history of other endocrine, nutritional and metabolic disease: Secondary | ICD-10-CM

## 2020-03-12 DIAGNOSIS — M8589 Other specified disorders of bone density and structure, multiple sites: Secondary | ICD-10-CM

## 2020-03-12 DIAGNOSIS — Z8679 Personal history of other diseases of the circulatory system: Secondary | ICD-10-CM

## 2020-03-12 DIAGNOSIS — Z8719 Personal history of other diseases of the digestive system: Secondary | ICD-10-CM

## 2020-04-12 ENCOUNTER — Other Ambulatory Visit: Payer: Self-pay | Admitting: Family Medicine

## 2020-04-12 DIAGNOSIS — Z1231 Encounter for screening mammogram for malignant neoplasm of breast: Secondary | ICD-10-CM

## 2020-05-22 ENCOUNTER — Other Ambulatory Visit: Payer: Self-pay

## 2020-05-22 ENCOUNTER — Ambulatory Visit
Admission: RE | Admit: 2020-05-22 | Discharge: 2020-05-22 | Disposition: A | Discharge status: Home / Self Care | Payer: Medicare Other | Source: Ambulatory Visit | Attending: Family Medicine | Admitting: Family Medicine

## 2020-05-22 DIAGNOSIS — Z1231 Encounter for screening mammogram for malignant neoplasm of breast: Secondary | ICD-10-CM | POA: Diagnosis present

## 2020-06-18 ENCOUNTER — Telehealth: Payer: Self-pay

## 2020-06-18 NOTE — Telephone Encounter (Signed)
Ok to reapply for visco gel injections for both knees. We will update x-rays at her initial visit.

## 2020-06-18 NOTE — Telephone Encounter (Signed)
Patient called stating she would like to apply for the gel injections for her bilateral knees.  Patient states she cancelled her last appointment in March because "she felt like she didn't need to be seen."  Please advise.

## 2020-06-20 NOTE — Telephone Encounter (Signed)
Submitted for VOB, and PA thru [facility]. 06/20/2020

## 2020-06-24 NOTE — Telephone Encounter (Signed)
06/21/2020 Patient called in to let doctor know that unfortunately due to raising gas prices she will no longer be able to travel here to be treated. Patient no longer wants to proceed with Visco knee injections.

## 2020-06-24 NOTE — Progress Notes (Signed)
Office Visit Note  Patient: Brittney Ray             Date of Birth: 05/28/1950           MRN: [id number]             PCP: Dion Body, MD Referring: Dion Body, MD Visit Date: 07/03/2020 Occupation: @GUAROCC @  Subjective:  Joint stiffness.   History of Present Illness: Brittney Ray is a 70 y.o. female with a history of osteoarthritis, osteopenia and Paget's disease.  She states currently she is not having any joint discomfort.  Although she still has difficulty climbing stairs due to discomfort in her right knee.  She has not had any discomfort in her hip joints although her trochanteric bursitis bothers her off and on.  She gives history of some joint stiffness.  She states she is dizzy because of her blood pressure and has tendency towards falling.  Activities of Daily Living:  Patient reports morning stiffness for 10 minute.   Patient Denies nocturnal pain.  Difficulty dressing/grooming: Denies Difficulty climbing stairs: Reports Difficulty getting out of chair: Reports Difficulty using hands for taps, buttons, cutlery, and/or writing: Denies  Review of Systems  Constitutional:  Negative for fatigue, night sweats, weight gain and weight loss.  HENT:  Negative for mouth sores, trouble swallowing, trouble swallowing, mouth dryness and nose dryness.   Eyes:  Negative for pain, redness, visual disturbance and dryness.  Respiratory:  Negative for cough, shortness of breath and difficulty breathing.   Cardiovascular:  Negative for chest pain, palpitations, hypertension, irregular heartbeat and swelling in legs/feet.  Gastrointestinal:  Negative for blood in stool, constipation and diarrhea.  Endocrine: Negative for increased urination.  Genitourinary:  Negative for vaginal dryness.  Musculoskeletal:  Negative for joint pain, joint pain, joint swelling, myalgias, muscle weakness, morning stiffness, muscle tenderness and myalgias.  Skin:  Negative for  color change, rash, hair loss, skin tightness, ulcers and sensitivity to sunlight.  Allergic/Immunologic: Negative for susceptible to infections.  Neurological:  Negative for dizziness, memory loss, night sweats and weakness.  Hematological:  Negative for swollen glands.  Psychiatric/Behavioral:  Negative for depressed mood and sleep disturbance. The patient is not nervous/anxious.    PMFS History:  Patient Active Problem List   Diagnosis Date Noted   TIA (transient ischemic attack) 12/03/2017   Primary osteoarthritis of both hands 08/30/2017   Trochanteric bursitis of both hips 08/30/2017   DDD (degenerative disc disease), lumbar 08/30/2017   H/O Paget's disease of bone 08/30/2017   Osteopenia of neck of right femur 03/04/2017   Primary osteoarthritis of both knees 01/10/2016   Chronic pain of both knees 01/10/2016   Obesity (BMI 30-39.9) 03/25/2015   Multinodular goiter 03/04/2015   Vaccine counseling 10/24/2014   Postprocedural hypothyroidism 10/08/2014   Type 2 diabetes mellitus without complication, without long-term current use of insulin ([place]) 10/08/2014   Breast cancer in situ 08/30/2014   Coronary artery disease 08/17/2013   NSTEMI (non-ST elevated myocardial infarction) ([place]) 08/17/2013   Acquired hypothyroidism 02/13/2013   Diabetes mellitus type 2, uncomplicated ([place]) 97/98/9211   Essential hypertension 02/13/2013   Mixed hyperlipidemia 02/13/2013   Personal history of breast cancer 02/13/2013    Past Medical History:  Diagnosis Date   Anxiety    panic attacks; no curren med.   Arthritis    knees   Breast cancer ([place]) 2014   RT LUMPECTOMY DCIS   Diabetes mellitus    NIDDM   GERD (gastroesophageal reflux  disease)    H/O hiatal hernia    Heart murmur    states never had any problems   Hypertension    under control, has been on med. > 10 yrs.   Hypothyroidism    Medial meniscus tear 04/2011   right knee   Nocturia    3-4 x/night   Paget's bone disease     hips   Personal history of radiation therapy 2014   right BREAST CA   PONV (postoperative nausea and vomiting)     Family History  Problem Relation Age of Onset   Breast cancer Sister 34   Breast cancer Cousin        maternal side 1st cousin   Past Surgical History:  Procedure Laterality Date   ABDOMINAL HYSTERECTOMY  1978   partial   BACK SURGERY  04/1990, 1996   BREAST BIOPSY Right 03/14/2012   2 areas UOQ of DCIS   BREAST CYST ASPIRATION Left    BREAST LUMPECTOMY Right 03/29/2012   right breast DCIS of two areas. Clear margins   [place] PROCEDURE  09/1989   with rectal repair   CHOLECYSTECTOMY  11/2005   FOOT SURGERY  1992   left   HAND SURGERY  10/1999   nerve repair right thumb   KNEE SURGERY  07/31/2009   right   KNEE SURGERY  2009   left   THYROID LOBECTOMY  03/1992   left   Social History   Social History Narrative   Not on file   Immunization History  Administered Date(s) Administered   Influenza-Unspecified 09/12/2013   PFIZER(Purple Top)SARS-COV-2 Vaccination 03/15/2019, 04/05/2019     Objective: Vital Signs: BP 119/72 (BP Location: Left Arm, Patient Position: Sitting, Cuff Size: Large)   Pulse 91   Resp 12   Ht 5\' 1"  (1.549 m)   Wt 187 lb 6.4 oz (85 kg)   BMI 35.41 kg/m    Physical Exam Vitals and nursing note reviewed.  Constitutional:      Appearance: She is well-developed.  HENT:     Head: Normocephalic and atraumatic.  Eyes:     Conjunctiva/sclera: Conjunctivae normal.  Cardiovascular:     Rate and Rhythm: Normal rate and regular rhythm.     Heart sounds: Normal heart sounds.  Pulmonary:     Effort: Pulmonary effort is normal.     Breath sounds: Normal breath sounds.  Abdominal:     General: Bowel sounds are normal.     Palpations: Abdomen is soft.  Musculoskeletal:     Cervical back: Normal range of motion.  Lymphadenopathy:     Cervical: No cervical adenopathy.  Skin:    General: Skin is warm and dry.     Capillary Refill:  Capillary refill takes less than 2 seconds.  Neurological:     Mental Status: She is alert and oriented to person, place, and time.  Psychiatric:        Behavior: Behavior normal.     Musculoskeletal Exam: C-spine and lumbar spine were in good range of motion.  Shoulder joints, elbow joints, wrist joints, MCPs PIPs and DIPs with good range of motion with no synovitis.  She had good range of motion of her hip joints with some tenderness over trochanteric bursa.  She had discomfort in her lower back with range of motion of her hip joints.  Knee joints with good range of motion.  There was no tenderness over ankles or MTPs.  CDAI Exam: CDAI Score: -- Patient Global: --;  Provider Global: -- Swollen: --; Tender: -- Joint Exam 07/03/2020   No joint exam has been documented for this visit   There is currently no information documented on the homunculus. Go to the Rheumatology activity and complete the homunculus joint exam.  Investigation: No additional findings.  Imaging: No results found.  Recent Labs: Lab Results  Component Value Date   WBC 11.8 (H) 01/21/2019   HGB 13.5 01/21/2019   PLT 214 01/21/2019   NA 138 09/11/2019   K 3.9 09/11/2019   CL 100 09/11/2019   CO2 24 09/11/2019   GLUCOSE 255 (H) 09/11/2019   BUN 27 (H) 09/11/2019   CREATININE 1.30 (H) 09/11/2019   BILITOT 0.5 09/11/2019   ALKPHOS 45 12/08/2017   AST 19 09/11/2019   ALT 26 09/11/2019   PROT 6.8 09/11/2019   ALBUMIN 4.2 12/08/2017   CALCIUM 9.8 09/11/2019   GFRAA 48 (L) 09/11/2019    Speciality Comments: No specialty comments available.  Procedures:  No procedures performed Allergies: Contrast media [iodinated diagnostic agents], Codeine, Morphine and related, and Sulfa antibiotics   Assessment / Plan:     Visit Diagnoses: Pain in left hip - X-ray of the hip joint obtained on September 11, 2019 was consistent with Paget's disease of the left proximal femur.  There was no interval change from the x-rays  compared to 2019.  She will follow-up with the orthopedic surgeon now.  Trochanteric bursitis of both hips-she continues to have some discomfort in the trochanteric area.  IT band stretches were discussed.  Primary osteoarthritis of both hands-she had no tenderness or synovitis on examination today.  Primary osteoarthritis of both knees - s/p hyalgan bilateral knees 09/2018.  She has had good response to Visco supplement injections in the past.  Due to increase in gas prices she plans to establish care with the orthopedic surgeon in [place].  She will follow-up with the orthopedic surgeon.  DDD (degenerative disc disease), lumbar-she has off-and-on discomfort in her lower lumbar region.  She had no point tenderness on examination.  Osteopenia of multiple sites - T score -1.4 onSeptember 26, 2018. DEXA order placed at the last visit.  I advised her to get repeat DEXA scan with her PCP.  H/O Paget's disease of bone - she had Reclast infusion several years ago.MRI from September 2019 which was remarkable for Paget's disease of the proximal left femur but no marrow lesions.  Her labs from January 21, 2019 showed alkaline phosphatase of 45.  I have advised her to get yearly alkaline phosphatase.  stage 3a chronic kidney disease-her GFR is in 40s.  She has follow-up with her PCP.  History of diabetes mellitus  History of hypothyroidism  History of coronary artery disease  History of hypertension-blood pressure was normal today.  History of hyperlipidemia  History of gastroesophageal reflux (GERD)  History of breast cancer  Obstructive sleep apnea- patient states she that she has been using CPAP.  She has chronic fatigue.  Orders: No orders of the defined types were placed in this encounter.  No orders of the defined types were placed in this encounter.    Follow-Up Instructions: Return if symptoms worsen or fail to improve, for Osteoarthritis.   Bo Merino, MD  Note -  This record has been created using Editor, commissioning.  Chart creation errors have been sought, but may not always  have been located. Such creation errors do not reflect on  the standard of medical care.

## 2020-06-26 ENCOUNTER — Telehealth: Payer: Self-pay

## 2020-06-26 NOTE — Telephone Encounter (Signed)
Brittney Ray from [employer] [employer] left a voicemail in regards to patient's Hyalgan.  We faxed benefit verification to the office on 06/21/20 and checking if a prior authorization has been initiated.  Please call back at [phone] option 2 or you can contact us via the portal.

## 2020-06-28 NOTE — Telephone Encounter (Signed)
A note was added in the portal. Patient decided to discontinue request on 06/21/2020.

## 2020-07-03 ENCOUNTER — Other Ambulatory Visit: Payer: Self-pay

## 2020-07-03 ENCOUNTER — Ambulatory Visit (INDEPENDENT_AMBULATORY_CARE_PROVIDER_SITE_OTHER): Payer: Medicare Other | Admitting: Rheumatology

## 2020-07-03 ENCOUNTER — Encounter: Payer: Self-pay | Admitting: Rheumatology

## 2020-07-03 VITALS — BP 119/72 | HR 91 | Resp 12 | Ht 61.0 in | Wt 187.4 lb

## 2020-07-03 DIAGNOSIS — N1831 Chronic kidney disease, stage 3a: Secondary | ICD-10-CM

## 2020-07-03 DIAGNOSIS — Z8679 Personal history of other diseases of the circulatory system: Secondary | ICD-10-CM

## 2020-07-03 DIAGNOSIS — M7062 Trochanteric bursitis, left hip: Secondary | ICD-10-CM

## 2020-07-03 DIAGNOSIS — M19041 Primary osteoarthritis, right hand: Secondary | ICD-10-CM | POA: Diagnosis not present

## 2020-07-03 DIAGNOSIS — Z8719 Personal history of other diseases of the digestive system: Secondary | ICD-10-CM

## 2020-07-03 DIAGNOSIS — M7061 Trochanteric bursitis, right hip: Secondary | ICD-10-CM

## 2020-07-03 DIAGNOSIS — M5136 Other intervertebral disc degeneration, lumbar region: Secondary | ICD-10-CM

## 2020-07-03 DIAGNOSIS — M8589 Other specified disorders of bone density and structure, multiple sites: Secondary | ICD-10-CM

## 2020-07-03 DIAGNOSIS — Z8739 Personal history of other diseases of the musculoskeletal system and connective tissue: Secondary | ICD-10-CM

## 2020-07-03 DIAGNOSIS — M170 Bilateral primary osteoarthritis of knee: Secondary | ICD-10-CM | POA: Diagnosis not present

## 2020-07-03 DIAGNOSIS — Z8639 Personal history of other endocrine, nutritional and metabolic disease: Secondary | ICD-10-CM

## 2020-07-03 DIAGNOSIS — G4733 Obstructive sleep apnea (adult) (pediatric): Secondary | ICD-10-CM

## 2020-07-03 DIAGNOSIS — M19042 Primary osteoarthritis, left hand: Secondary | ICD-10-CM

## 2020-07-03 DIAGNOSIS — Z853 Personal history of malignant neoplasm of breast: Secondary | ICD-10-CM

## 2020-07-03 NOTE — Patient Instructions (Addendum)
Please schedule  DEXA scan with your PCP to evaluate for osteopenia.  Please get CMP with GFR with your PCP once a year to monitor alkaline phosphatase level due to Paget's disease.

## 2020-12-27 ENCOUNTER — Emergency Department: Payer: Medicare Other

## 2020-12-27 ENCOUNTER — Other Ambulatory Visit: Payer: Self-pay

## 2020-12-27 ENCOUNTER — Emergency Department
Admission: EM | Admit: 2020-12-27 | Discharge: 2020-12-27 | Disposition: A | Discharge status: Home / Self Care | Payer: Medicare Other | Attending: Emergency Medicine | Admitting: Emergency Medicine

## 2020-12-27 DIAGNOSIS — Z79899 Other long term (current) drug therapy: Secondary | ICD-10-CM | POA: Insufficient documentation

## 2020-12-27 DIAGNOSIS — Z7982 Long term (current) use of aspirin: Secondary | ICD-10-CM | POA: Diagnosis not present

## 2020-12-27 DIAGNOSIS — E039 Hypothyroidism, unspecified: Secondary | ICD-10-CM | POA: Insufficient documentation

## 2020-12-27 DIAGNOSIS — S2241XA Multiple fractures of ribs, right side, initial encounter for closed fracture: Secondary | ICD-10-CM | POA: Diagnosis not present

## 2020-12-27 DIAGNOSIS — E119 Type 2 diabetes mellitus without complications: Secondary | ICD-10-CM | POA: Insufficient documentation

## 2020-12-27 DIAGNOSIS — Y92410 Unspecified street and highway as the place of occurrence of the external cause: Secondary | ICD-10-CM | POA: Insufficient documentation

## 2020-12-27 DIAGNOSIS — Z7984 Long term (current) use of oral hypoglycemic drugs: Secondary | ICD-10-CM | POA: Diagnosis not present

## 2020-12-27 DIAGNOSIS — Z853 Personal history of malignant neoplasm of breast: Secondary | ICD-10-CM | POA: Diagnosis not present

## 2020-12-27 DIAGNOSIS — I10 Essential (primary) hypertension: Secondary | ICD-10-CM | POA: Diagnosis not present

## 2020-12-27 DIAGNOSIS — T07XXXA Unspecified multiple injuries, initial encounter: Secondary | ICD-10-CM

## 2020-12-27 DIAGNOSIS — S8002XA Contusion of left knee, initial encounter: Secondary | ICD-10-CM | POA: Diagnosis not present

## 2020-12-27 DIAGNOSIS — S299XXA Unspecified injury of thorax, initial encounter: Secondary | ICD-10-CM | POA: Diagnosis present

## 2020-12-27 MED ORDER — HYDROCODONE-ACETAMINOPHEN 5-325 MG PO TABS
1.0000 | ORAL_TABLET | Freq: Once | ORAL | Status: AC
  Administered 2020-12-27: 1 via ORAL
  Filled 2020-12-27: qty 1

## 2020-12-27 MED ORDER — HYDROCODONE-ACETAMINOPHEN 5-325 MG PO TABS: 1.0000 | ORAL_TABLET | Freq: Four times a day (QID) | ORAL | 0 refills | Status: DC | PRN
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
## 2020-12-27 NOTE — Discharge Instructions (Addendum)
Follow-up with your primary care provider if any continued problems or concerns.  A prescription for hydrocodone was sent to your pharmacy as needed for moderate to severe pain.  This medication could cause drowsiness and increase your risk for falling.  May also need to apply ice to your multiple bruises and over your right ribs as needed for discomfort.
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
## 2020-12-27 NOTE — ED Notes (Signed)
Pt calling husband for ride home

## 2020-12-27 NOTE — ED Notes (Signed)
Pt asked for assistance to bathroom for BM. Pt complains of whole body hurting. Assisted to bathroom with wheelchair. Will give pain med when pt returns to room. Pt has ride home.

## 2021-04-02 ENCOUNTER — Other Ambulatory Visit: Payer: Self-pay | Admitting: Neurology

## 2021-04-02 ENCOUNTER — Other Ambulatory Visit (HOSPITAL_COMMUNITY): Payer: Self-pay | Admitting: Neurology

## 2021-04-02 DIAGNOSIS — R413 Other amnesia: Secondary | ICD-10-CM

## 2021-04-11 ENCOUNTER — Ambulatory Visit: Payer: Medicare Other

## 2021-04-14 ENCOUNTER — Other Ambulatory Visit: Payer: Self-pay | Admitting: Family Medicine

## 2021-04-14 DIAGNOSIS — Z1231 Encounter for screening mammogram for malignant neoplasm of breast: Secondary | ICD-10-CM

## 2021-04-15 ENCOUNTER — Ambulatory Visit: Payer: Medicare Other

## 2021-04-19 ENCOUNTER — Ambulatory Visit
Admission: RE | Admit: 2021-04-19 | Discharge: 2021-04-19 | Disposition: A | Discharge status: Home / Self Care | Payer: Medicare Other | Source: Ambulatory Visit | Attending: Neurology | Admitting: Neurology

## 2021-04-19 DIAGNOSIS — R413 Other amnesia: Secondary | ICD-10-CM | POA: Diagnosis not present

## 2021-04-19 MED ORDER — GADOBUTROL 1 MMOL/ML IV SOLN
7.5000 mL | Freq: Once | INTRAVENOUS | Status: AC | PRN
  Administered 2021-04-19: 7.5 mL via INTRAVENOUS

## 2021-04-19 MED ORDER — GADOBENATE DIMEGLUMINE 529 MG/ML IV SOLN: 7.5000 mL | Freq: Once | INTRAVENOUS | Status: DC | PRN

## 2021-05-26 ENCOUNTER — Ambulatory Visit
Admission: RE | Admit: 2021-05-26 | Discharge: 2021-05-26 | Disposition: A | Discharge status: Home / Self Care | Payer: Medicare Other | Source: Ambulatory Visit | Attending: Family Medicine | Admitting: Family Medicine

## 2021-05-26 DIAGNOSIS — Z1231 Encounter for screening mammogram for malignant neoplasm of breast: Secondary | ICD-10-CM | POA: Insufficient documentation

## 2021-08-23 ENCOUNTER — Other Ambulatory Visit: Payer: Self-pay

## 2021-08-23 ENCOUNTER — Emergency Department: Payer: Medicare Other

## 2021-08-23 ENCOUNTER — Observation Stay
Admission: EM | Admit: 2021-08-23 | Discharge: 2021-08-27 | Disposition: A | Discharge status: Skilled Nursing Facility | Payer: Medicare Other | Attending: Internal Medicine | Admitting: Internal Medicine

## 2021-08-23 DIAGNOSIS — Z7982 Long term (current) use of aspirin: Secondary | ICD-10-CM | POA: Insufficient documentation

## 2021-08-23 DIAGNOSIS — Z7984 Long term (current) use of oral hypoglycemic drugs: Secondary | ICD-10-CM | POA: Insufficient documentation

## 2021-08-23 DIAGNOSIS — D649 Anemia, unspecified: Secondary | ICD-10-CM | POA: Diagnosis present

## 2021-08-23 DIAGNOSIS — N183 Chronic kidney disease, stage 3 unspecified: Secondary | ICD-10-CM | POA: Diagnosis not present

## 2021-08-23 DIAGNOSIS — R55 Syncope and collapse: Secondary | ICD-10-CM | POA: Diagnosis not present

## 2021-08-23 DIAGNOSIS — Z79890 Hormone replacement therapy (postmenopausal): Secondary | ICD-10-CM | POA: Diagnosis not present

## 2021-08-23 DIAGNOSIS — E890 Postprocedural hypothyroidism: Secondary | ICD-10-CM | POA: Diagnosis not present

## 2021-08-23 DIAGNOSIS — E119 Type 2 diabetes mellitus without complications: Secondary | ICD-10-CM

## 2021-08-23 DIAGNOSIS — R531 Weakness: Secondary | ICD-10-CM | POA: Diagnosis not present

## 2021-08-23 DIAGNOSIS — R269 Unspecified abnormalities of gait and mobility: Secondary | ICD-10-CM | POA: Diagnosis not present

## 2021-08-23 DIAGNOSIS — Z20822 Contact with and (suspected) exposure to covid-19: Secondary | ICD-10-CM | POA: Diagnosis not present

## 2021-08-23 DIAGNOSIS — Z853 Personal history of malignant neoplasm of breast: Secondary | ICD-10-CM | POA: Diagnosis not present

## 2021-08-23 DIAGNOSIS — Z9181 History of falling: Secondary | ICD-10-CM | POA: Diagnosis not present

## 2021-08-23 DIAGNOSIS — E1122 Type 2 diabetes mellitus with diabetic chronic kidney disease: Secondary | ICD-10-CM | POA: Insufficient documentation

## 2021-08-23 DIAGNOSIS — F419 Anxiety disorder, unspecified: Secondary | ICD-10-CM

## 2021-08-23 DIAGNOSIS — L8990 Pressure ulcer of unspecified site, unspecified stage: Secondary | ICD-10-CM | POA: Insufficient documentation

## 2021-08-23 DIAGNOSIS — I10 Essential (primary) hypertension: Secondary | ICD-10-CM | POA: Diagnosis present

## 2021-08-23 DIAGNOSIS — F418 Other specified anxiety disorders: Secondary | ICD-10-CM

## 2021-08-23 DIAGNOSIS — Z79899 Other long term (current) drug therapy: Secondary | ICD-10-CM | POA: Diagnosis not present

## 2021-08-23 DIAGNOSIS — R2681 Unsteadiness on feet: Secondary | ICD-10-CM | POA: Insufficient documentation

## 2021-08-23 DIAGNOSIS — I129 Hypertensive chronic kidney disease with stage 1 through stage 4 chronic kidney disease, or unspecified chronic kidney disease: Secondary | ICD-10-CM | POA: Diagnosis not present

## 2021-08-23 DIAGNOSIS — E039 Hypothyroidism, unspecified: Secondary | ICD-10-CM | POA: Diagnosis present

## 2021-08-23 DIAGNOSIS — E782 Mixed hyperlipidemia: Secondary | ICD-10-CM | POA: Diagnosis present

## 2021-08-23 DIAGNOSIS — I2510 Atherosclerotic heart disease of native coronary artery without angina pectoris: Secondary | ICD-10-CM | POA: Diagnosis not present

## 2021-08-23 DIAGNOSIS — W19XXXA Unspecified fall, initial encounter: Secondary | ICD-10-CM

## 2021-08-23 LAB — CBC
HCT: 30.7 % — ABNORMAL LOW (ref 36.0–46.0)
Hemoglobin: 9.9 g/dL — ABNORMAL LOW (ref 12.0–15.0)
MCH: 28.1 pg (ref 26.0–34.0)
MCHC: 32.2 g/dL (ref 30.0–36.0)
MCV: 87.2 fL (ref 80.0–100.0)
Platelets: 264 10*3/uL (ref 150–400)
RBC: 3.52 MIL/uL — ABNORMAL LOW (ref 3.87–5.11)
RDW: 14.6 % (ref 11.5–15.5)
WBC: 8.9 10*3/uL (ref 4.0–10.5)
nRBC: 0 % (ref 0.0–0.2)

## 2021-08-23 LAB — COMPREHENSIVE METABOLIC PANEL
ALT: 23 U/L (ref 0–44)
AST: 35 U/L (ref 15–41)
Albumin: 3.5 g/dL (ref 3.5–5.0)
Alkaline Phosphatase: 28 U/L — ABNORMAL LOW (ref 38–126)
Anion gap: 9 (ref 5–15)
BUN: 33 mg/dL — ABNORMAL HIGH (ref 8–23)
CO2: 26 mmol/L (ref 22–32)
Calcium: 9.6 mg/dL (ref 8.9–10.3)
Chloride: 102 mmol/L (ref 98–111)
Creatinine, Ser: 1.18 mg/dL — ABNORMAL HIGH (ref 0.44–1.00)
GFR, Estimated: 49 mL/min — ABNORMAL LOW (ref >60)
Glucose, Bld: 164 mg/dL — ABNORMAL HIGH (ref 70–99)
Potassium: 3.5 mmol/L (ref 3.5–5.1)
Sodium: 137 mmol/L (ref 135–145)
Total Bilirubin: 0.7 mg/dL (ref 0.3–1.2)
Total Protein: 6.1 g/dL — ABNORMAL LOW (ref 6.5–8.1)

## 2021-08-23 LAB — URINALYSIS, COMPLETE (UACMP) WITH MICROSCOPIC
Bacteria, UA: NONE SEEN
Bilirubin Urine: NEGATIVE
Glucose, UA: NEGATIVE mg/dL
Hgb urine dipstick: NEGATIVE
Ketones, ur: NEGATIVE mg/dL
Nitrite: NEGATIVE
Protein, ur: 30 mg/dL — AB
Specific Gravity, Urine: 1.023 (ref 1.005–1.030)
pH: 5 (ref 5.0–8.0)

## 2021-08-23 LAB — RESP PANEL BY RT-PCR (FLU A&B, COVID) ARPGX2
Influenza A by PCR: NEGATIVE
Influenza B by PCR: NEGATIVE
SARS Coronavirus 2 by RT PCR: NEGATIVE

## 2021-08-23 LAB — CK: Total CK: 272 U/L — ABNORMAL HIGH (ref 38–234)

## 2021-08-23 LAB — TROPONIN I (HIGH SENSITIVITY): Troponin I (High Sensitivity): 11 ng/L (ref <18)

## 2021-08-23 LAB — CBG MONITORING, ED: Glucose-Capillary: 163 mg/dL — ABNORMAL HIGH (ref 70–99)

## 2021-08-23 MED ORDER — SODIUM CHLORIDE 0.9 % IV BOLUS
1000.0000 mL | Freq: Once | INTRAVENOUS | Status: AC
  Administered 2021-08-23: 1000 mL via INTRAVENOUS
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
## 2021-08-23 NOTE — Discharge Instructions (Signed)
Please follow-up with your primary care doctor for further evaluation.  Your CT scan does show some enlargement of your thyroid gland please follow-up with your primary care doctor for a repeat ultrasound in the near future and to further evaluate.

## 2021-08-24 ENCOUNTER — Observation Stay
Admit: 2021-08-24 | Discharge: 2021-08-24 | Disposition: A | Discharge status: Home / Self Care | Payer: Medicare Other | Attending: Family Medicine | Admitting: Family Medicine

## 2021-08-24 DIAGNOSIS — F419 Anxiety disorder, unspecified: Secondary | ICD-10-CM

## 2021-08-24 DIAGNOSIS — F418 Other specified anxiety disorders: Secondary | ICD-10-CM

## 2021-08-24 DIAGNOSIS — R55 Syncope and collapse: Secondary | ICD-10-CM | POA: Diagnosis present

## 2021-08-24 DIAGNOSIS — N183 Chronic kidney disease, stage 3 unspecified: Secondary | ICD-10-CM | POA: Diagnosis present

## 2021-08-24 DIAGNOSIS — D649 Anemia, unspecified: Secondary | ICD-10-CM | POA: Diagnosis present

## 2021-08-24 LAB — GLUCOSE, CAPILLARY
Glucose-Capillary: 118 mg/dL — ABNORMAL HIGH (ref 70–99)
Glucose-Capillary: 168 mg/dL — ABNORMAL HIGH (ref 70–99)

## 2021-08-24 LAB — ECHOCARDIOGRAM COMPLETE
AR max vel: 1.73 cm2
AV Peak grad: 11 mmHg
Ao pk vel: 1.66 m/s
Area-P 1/2: 3.31 cm2
Height: 61 in
S' Lateral: 2.87 cm
Single Plane A2C EF: 57.8 %
Weight: 2272 oz

## 2021-08-24 LAB — LIPID PANEL
Cholesterol: 110 mg/dL (ref 0–200)
HDL: 44 mg/dL (ref >40)
LDL Cholesterol: 48 mg/dL (ref 0–99)
Total CHOL/HDL Ratio: 2.5 RATIO
Triglycerides: 90 mg/dL (ref <150)
VLDL: 18 mg/dL (ref 0–40)

## 2021-08-24 LAB — IRON AND TIBC
Iron: 27 ug/dL — ABNORMAL LOW (ref 28–170)
Saturation Ratios: 9 % — ABNORMAL LOW (ref 10.4–31.8)
TIBC: 291 ug/dL (ref 250–450)
UIBC: 264 ug/dL

## 2021-08-24 LAB — CBG MONITORING, ED
Glucose-Capillary: 120 mg/dL — ABNORMAL HIGH (ref 70–99)
Glucose-Capillary: 137 mg/dL — ABNORMAL HIGH (ref 70–99)

## 2021-08-24 LAB — VITAMIN D 25 HYDROXY (VIT D DEFICIENCY, FRACTURES): Vit D, 25-Hydroxy: 40.92 ng/mL (ref 30–100)

## 2021-08-24 LAB — MAGNESIUM: Magnesium: 1 mg/dL — ABNORMAL LOW (ref 1.7–2.4)

## 2021-08-24 LAB — FOLATE: Folate: 5 ng/mL — ABNORMAL LOW (ref >5.9)

## 2021-08-24 LAB — TRANSFERRIN: Transferrin: 208 mg/dL (ref 192–382)

## 2021-08-24 LAB — HEMOGLOBIN A1C
Hgb A1c MFr Bld: 5 % (ref 4.8–5.6)
Mean Plasma Glucose: 96.8 mg/dL

## 2021-08-24 LAB — VITAMIN B12: Vitamin B-12: 633 pg/mL (ref 180–914)

## 2021-08-24 LAB — TROPONIN I (HIGH SENSITIVITY)
Troponin I (High Sensitivity): 10 ng/L (ref <18)
Troponin I (High Sensitivity): 12 ng/L (ref <18)
Troponin I (High Sensitivity): 13 ng/L (ref <18)

## 2021-08-24 LAB — TSH: TSH: 0.685 u[IU]/mL (ref 0.350–4.500)

## 2021-08-24 LAB — FERRITIN: Ferritin: 69 ng/mL (ref 11–307)

## 2021-08-24 LAB — PHOSPHORUS: Phosphorus: 3.3 mg/dL (ref 2.5–4.6)

## 2021-08-24 MED ORDER — LISINOPRIL-HYDROCHLOROTHIAZIDE 20-12.5 MG PO TABS: 1.0000 | ORAL_TABLET | Freq: Every day | ORAL | Status: DC

## 2021-08-24 MED ORDER — ONDANSETRON HCL 4 MG/2ML IJ SOLN: 4.0000 mg | Freq: Four times a day (QID) | INTRAMUSCULAR | Status: DC | PRN

## 2021-08-24 MED ORDER — ASPIRIN 325 MG PO TABS
325.0000 mg | ORAL_TABLET | Freq: Every day | ORAL | Status: DC
  Administered 2021-08-24 – 2021-08-27 (×4): 325 mg via ORAL
  Filled 2021-08-24 (×4): qty 1

## 2021-08-24 MED ORDER — SENNOSIDES-DOCUSATE SODIUM 8.6-50 MG PO TABS: 1.0000 | ORAL_TABLET | Freq: Every evening | ORAL | Status: DC | PRN

## 2021-08-24 MED ORDER — ONDANSETRON HCL 4 MG PO TABS: 4.0000 mg | ORAL_TABLET | Freq: Four times a day (QID) | ORAL | Status: DC | PRN

## 2021-08-24 MED ORDER — INSULIN ASPART 100 UNIT/ML IJ SOLN
0.0000 [IU] | Freq: Every day | INTRAMUSCULAR | Status: DC
  Filled 2021-08-24: qty 1

## 2021-08-24 MED ORDER — CARVEDILOL 3.125 MG PO TABS
3.1250 mg | ORAL_TABLET | Freq: Two times a day (BID) | ORAL | Status: DC
  Administered 2021-08-24 – 2021-08-27 (×7): 3.125 mg via ORAL
  Filled 2021-08-24 (×7): qty 1

## 2021-08-24 MED ORDER — ATORVASTATIN CALCIUM 20 MG PO TABS
40.0000 mg | ORAL_TABLET | Freq: Every day | ORAL | Status: DC
  Administered 2021-08-24 – 2021-08-26 (×3): 40 mg via ORAL
  Filled 2021-08-24 (×3): qty 2

## 2021-08-24 MED ORDER — MAGNESIUM SULFATE 4 GM/100ML IV SOLN
4.0000 g | Freq: Once | INTRAVENOUS | Status: AC
Start: 2021-08-24 — End: 2021-08-24
  Administered 2021-08-24: 4 g via INTRAVENOUS
  Filled 2021-08-24: qty 100

## 2021-08-24 MED ORDER — ACETAMINOPHEN 325 MG PO TABS: 650.0000 mg | ORAL_TABLET | Freq: Four times a day (QID) | ORAL | Status: DC | PRN

## 2021-08-24 MED ORDER — ACETAMINOPHEN 650 MG RE SUPP: 650.0000 mg | Freq: Four times a day (QID) | RECTAL | Status: DC | PRN

## 2021-08-24 MED ORDER — HYDROCODONE-ACETAMINOPHEN 5-325 MG PO TABS
1.0000 | ORAL_TABLET | ORAL | Status: DC | PRN
  Administered 2021-08-24 (×2): 1 via ORAL
  Administered 2021-08-24 – 2021-08-25 (×2): 2 via ORAL
  Administered 2021-08-25: 1 via ORAL
  Administered 2021-08-25 – 2021-08-26 (×4): 2 via ORAL
  Administered 2021-08-27: 1 via ORAL
  Filled 2021-08-24: qty 2
  Filled 2021-08-24 (×2): qty 1
  Filled 2021-08-24 (×3): qty 2
  Filled 2021-08-24: qty 1
  Filled 2021-08-24 (×2): qty 2
  Filled 2021-08-24: qty 1

## 2021-08-24 MED ORDER — HYDROCHLOROTHIAZIDE 12.5 MG PO TABS
12.5000 mg | ORAL_TABLET | Freq: Every day | ORAL | Status: DC
  Administered 2021-08-24 – 2021-08-27 (×4): 12.5 mg via ORAL
  Filled 2021-08-24 (×4): qty 1

## 2021-08-24 MED ORDER — ESCITALOPRAM OXALATE 10 MG PO TABS
10.0000 mg | ORAL_TABLET | Freq: Every day | ORAL | Status: DC
  Administered 2021-08-24 – 2021-08-27 (×4): 10 mg via ORAL
  Filled 2021-08-24 (×4): qty 1

## 2021-08-24 MED ORDER — FE FUMARATE-B12-VIT C-FA-IFC PO CAPS
1.0000 | ORAL_CAPSULE | Freq: Two times a day (BID) | ORAL | Status: DC
  Administered 2021-08-24 – 2021-08-27 (×4): 1 via ORAL
  Filled 2021-08-24 (×8): qty 1

## 2021-08-24 MED ORDER — SODIUM CHLORIDE 0.9% FLUSH
3.0000 mL | Freq: Two times a day (BID) | INTRAVENOUS | Status: DC
  Administered 2021-08-24 – 2021-08-25 (×3): 3 mL via INTRAVENOUS

## 2021-08-24 MED ORDER — INSULIN ASPART 100 UNIT/ML IJ SOLN
0.0000 [IU] | Freq: Three times a day (TID) | INTRAMUSCULAR | Status: DC
  Administered 2021-08-24: 2 [IU] via SUBCUTANEOUS
  Administered 2021-08-25 – 2021-08-27 (×2): 3 [IU] via SUBCUTANEOUS
  Filled 2021-08-24 (×4): qty 1

## 2021-08-24 MED ORDER — ALUM & MAG HYDROXIDE-SIMETH 200-200-20 MG/5ML PO SUSP: 30.0000 mL | Freq: Four times a day (QID) | ORAL | Status: DC | PRN

## 2021-08-24 MED ORDER — BUSPIRONE HCL 10 MG PO TABS
5.0000 mg | ORAL_TABLET | Freq: Two times a day (BID) | ORAL | Status: DC
  Administered 2021-08-24 – 2021-08-27 (×7): 5 mg via ORAL
  Filled 2021-08-24 (×7): qty 1

## 2021-08-24 MED ORDER — PANTOPRAZOLE SODIUM 40 MG PO TBEC
40.0000 mg | DELAYED_RELEASE_TABLET | Freq: Every day | ORAL | Status: DC
  Administered 2021-08-24 – 2021-08-27 (×4): 40 mg via ORAL
  Filled 2021-08-24 (×4): qty 1

## 2021-08-24 MED ORDER — MAGNESIUM CITRATE PO SOLN: 1.0000 | Freq: Once | ORAL | Status: DC | PRN

## 2021-08-24 MED ORDER — LEVOTHYROXINE SODIUM 50 MCG PO TABS: 50.0000 ug | ORAL_TABLET | Freq: Every day | ORAL | Status: DC

## 2021-08-24 MED ORDER — AMLODIPINE BESYLATE 5 MG PO TABS
5.0000 mg | ORAL_TABLET | Freq: Every day | ORAL | Status: DC
  Administered 2021-08-24 – 2021-08-27 (×4): 5 mg via ORAL
  Filled 2021-08-24 (×4): qty 1

## 2021-08-24 MED ORDER — LEVOTHYROXINE SODIUM 50 MCG PO TABS
50.0000 ug | ORAL_TABLET | Freq: Every day | ORAL | Status: DC
  Administered 2021-08-24 – 2021-08-27 (×4): 50 ug via ORAL
  Filled 2021-08-24 (×4): qty 1

## 2021-08-24 MED ORDER — FENOFIBRATE 160 MG PO TABS
160.0000 mg | ORAL_TABLET | Freq: Every day | ORAL | Status: DC
  Administered 2021-08-24 – 2021-08-27 (×4): 160 mg via ORAL
  Filled 2021-08-24 (×4): qty 1

## 2021-08-24 MED ORDER — LISINOPRIL 20 MG PO TABS
20.0000 mg | ORAL_TABLET | Freq: Every day | ORAL | Status: DC
  Administered 2021-08-24 – 2021-08-27 (×4): 20 mg via ORAL
  Filled 2021-08-24 (×3): qty 1
  Filled 2021-08-24: qty 2

## 2021-08-24 MED ORDER — BISACODYL 5 MG PO TBEC
5.0000 mg | DELAYED_RELEASE_TABLET | Freq: Every day | ORAL | Status: DC | PRN
  Administered 2021-08-26 – 2021-08-27 (×2): 5 mg via ORAL
  Filled 2021-08-24 (×2): qty 1

## 2021-08-24 MED ORDER — SODIUM CHLORIDE 0.9 % IV SOLN: INTRAVENOUS | Status: DC

## 2021-08-24 NOTE — Assessment & Plan Note (Signed)
-   Continue home BuSpar and Lexapro

## 2021-08-24 NOTE — Assessment & Plan Note (Signed)
CBG within goal. -Continue with SSI

## 2021-08-24 NOTE — Assessment & Plan Note (Signed)
Magnesium at 1. -Replete magnesium and monitor

## 2021-08-24 NOTE — Evaluation (Signed)
Physical Therapy Evaluation Patient Details Name: Brittney Ray MRN: [id number] DOB: Aug 04, 1950 Today's Date: 08/24/2021  History of Present Illness  Pt is a 71 y/o F admitted on 08/23/21 after presenting to the ED for evaluation following multiple falls 2/2 worsening confusion & weakness. CT of head & cervical spine were negative for acute bony abnormality. PMH: DM, HTN, hypothyroidism, anxiety, breast CA  Clinical Impression  Pt seen for PT evaluation with pt's sister present for session. Prior to admission pt resided with husband in a 1 level home with 3 steps without rails to enter, ambulated with RW, sister assisted with bathing, & pt with recent hx of falls. On this date pt is AxOx4 (which sister seems pleasantly surprised about) but demonstrates impaired cognition during gait. Pt also endorses significant R rib pain & family requesting x-ray -- MD notified. Pt requires mod assist for supine>sit & max assist sit>supine. Pt is able to stand EOB demonstrating posterior lean with BUE support on RW. Upon gait attempt, pt begins walking with BLE heels off ground (sister reports she's been doing that for weeks) before simply stopping mid stride & staring off (sister reports pt has been doing this more frequently). Pt instructed to return to bed & requires max cuing for side stepping at EOB with mod assist & RW before returning to bed. Pt would benefit from STR upon d/c to maximize independence with functional mobility, decrease caregiver burden, & reduce fall risk prior to return home.    BP checked in LUE: Supine: 126/61 mmHg (MAP 80) Sitting: 148/60 mmHg (MAP 87) In bed at end of session: 134/54 mmHg (MAP 78) No reports of symptoms during session.     Recommendations for follow up therapy are one component of a multi-disciplinary discharge planning process, led by the attending physician.  Recommendations may be updated based on patient status, additional functional criteria and insurance  authorization.  Follow Up Recommendations Skilled nursing-short term rehab (<3 hours/day) Can patient physically be transported by private vehicle: No    Assistance Recommended at Discharge Frequent or constant Supervision/Assistance  Patient can return home with the following  A lot of help with walking and/or transfers;A lot of help with bathing/dressing/bathroom;Assistance with cooking/housework;Assist for transportation;Direct supervision/assist for medications management;Help with stairs or ramp for entrance    Equipment Recommendations None recommended by PT  Recommendations for Other Services  OT consult    Functional Status Assessment Patient has had a recent decline in their functional status and demonstrates the ability to make significant improvements in function in a reasonable and predictable amount of time.     Precautions / Restrictions Precautions Precautions: Fall Restrictions Weight Bearing Restrictions: No      Mobility  Bed Mobility Overal bed mobility: Needs Assistance Bed Mobility: Supine to Sit, Sit to Supine     Supine to sit: Min assist, HOB elevated (cuing for technique, requires BUE support 2/2 R rib pain) Sit to supine: Max assist (assistance to safely lower trunk onto bed, elevate BLE onto bed)        Transfers Overall transfer level: Needs assistance Equipment used: Rolling walker (2 wheels) Transfers: Sit to/from Stand Sit to Stand: Min assist           General transfer comment: from elevated ED stretcher    Ambulation/Gait                  Stairs            Wheelchair Mobility    Modified  Rankin (Stroke Patients Only)       Balance Overall balance assessment: Needs assistance, History of Falls Sitting-balance support: Bilateral upper extremity supported, Feet unsupported Sitting balance-Leahy Scale: Fair Sitting balance - Comments: supervision static sitting EOB                                      Pertinent Vitals/Pain Pain Assessment Pain Assessment: Faces Faces Pain Scale: Hurts whole lot Pain Location: R ribs Pain Descriptors / Indicators: Grimacing, Discomfort Pain Intervention(s): Monitored during session, Repositioned    Home Living Family/patient expects to be discharged to:: Private residence Living Arrangements: Spouse/significant other Available Help at Discharge: Family;Available 24 hours/day Type of Home: House Home Access: Stairs to enter Entrance Stairs-Rails: None Entrance Stairs-Number of Steps: 3   Home Layout: One level Home Equipment: Conservation officer, nature (2 wheels)      Prior Function               Mobility Comments: Pt has been ambulatory with RW without physical assistance but many falls prior to admission. Has been requesting assistance for getting OOB at home but sister encouraging pt to complete herself. Not driving. ADLs Comments: Sister assists with bathing. Incontinent at baseline.     Hand Dominance        Extremity/Trunk Assessment   Upper Extremity Assessment Upper Extremity Assessment: Generalized weakness    Lower Extremity Assessment Lower Extremity Assessment: Generalized weakness       Communication   Communication: No difficulties  Cognition Arousal/Alertness: Awake/alert Behavior During Therapy: WFL for tasks assessed/performed Overall Cognitive Status: History of cognitive impairments - at baseline                                 General Comments: Pt is AxOx4 during session but demonstrates cognitive impairments during functional mobility; pt also with noted hx of possible atypical parkinson's or lewy body dementia in chart.        General Comments      Exercises     Assessment/Plan    PT Assessment Patient needs continued PT services  PT Problem List Decreased strength;Decreased mobility;Decreased safety awareness;Decreased activity tolerance;Decreased balance;Pain       PT Treatment  Interventions DME instruction;Therapeutic exercise;Gait training;Balance training;Stair training;Neuromuscular re-education;Functional mobility training;Therapeutic activities;Patient/family education;Cognitive remediation;Manual techniques;Modalities    PT Goals (Current goals can be found in the Care Plan section)  Acute Rehab PT Goals Patient Stated Goal: decreased pain PT Goal Formulation: With patient Time For Goal Achievement: 09/07/21 Potential to Achieve Goals: Fair    Frequency Min 2X/week     Co-evaluation               AM-PAC PT "6 Clicks" Mobility  Outcome Measure Help needed turning from your back to your side while in a flat bed without using bedrails?: A Lot Help needed moving from lying on your back to sitting on the side of a flat bed without using bedrails?: A Lot Help needed moving to and from a bed to a chair (including a wheelchair)?: A Lot Help needed standing up from a chair using your arms (e.g., wheelchair or bedside chair)?: A Little Help needed to walk in hospital room?: A Lot Help needed climbing 3-5 steps with a railing? : A Lot 6 Click Score: 13    End of Session   Activity Tolerance: Patient  limited by pain (limited by cognition) Patient left: in bed;with call bell/phone within reach;with family/visitor present Nurse Communication:  (notified MD of family requesting x-ray as they are concerned pt has rib fxs) PT Visit Diagnosis: Unsteadiness on feet (R26.81);History of falling (Z91.81);Other abnormalities of gait and mobility (R26.89)    Time: 1413-1430 PT Time Calculation (min) (ACUTE ONLY): 17 min   Charges:   PT Evaluation $PT Eval Moderate Complexity: [place], PT, DPT 08/24/21, 2:45 PM   Waunita Schooner 08/24/2021, 2:41 PM
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
## 2021-08-24 NOTE — Assessment & Plan Note (Addendum)
Hemoglobin 9.9, seems like slowly declining.  Patient denies any obvious bleeding. Anemia panel with some iron on folic acid deficiency along with anemia of chronic disease. -Start her on supplement -Check vitamin D

## 2021-08-24 NOTE — Progress Notes (Signed)
*  PRELIMINARY RESULTS* Echocardiogram 2D Echocardiogram has been performed.  Claretta Fraise 08/24/2021, 11:22 AM

## 2021-08-24 NOTE — Assessment & Plan Note (Signed)
Continue Synthroid °

## 2021-08-24 NOTE — Assessment & Plan Note (Signed)
No chest pain. -Continue home aspirin, statin and carvedilol

## 2021-08-24 NOTE — Assessment & Plan Note (Signed)
Blood pressure within goal. -Continue home carvedilol, amlodipine, HCTZ and lisinopril.

## 2021-08-24 NOTE — ED Notes (Signed)
Advised nurse that patient has ready bed 
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
## 2021-08-24 NOTE — Assessment & Plan Note (Signed)
-   Continue home Lipitor and fenofibrate

## 2021-08-24 NOTE — Assessment & Plan Note (Signed)
Seems like having CKD stage IIIa.  Creatinine around baseline. -Encourage p.o. hydration -Monitor renal function

## 2021-08-25 ENCOUNTER — Observation Stay: Payer: Medicare Other

## 2021-08-25 DIAGNOSIS — L8990 Pressure ulcer of unspecified site, unspecified stage: Secondary | ICD-10-CM | POA: Insufficient documentation

## 2021-08-25 DIAGNOSIS — R55 Syncope and collapse: Secondary | ICD-10-CM | POA: Diagnosis not present

## 2021-08-25 LAB — BASIC METABOLIC PANEL
Anion gap: 10 (ref 5–15)
BUN: 26 mg/dL — ABNORMAL HIGH (ref 8–23)
CO2: 25 mmol/L (ref 22–32)
Calcium: 8.7 mg/dL — ABNORMAL LOW (ref 8.9–10.3)
Chloride: 102 mmol/L (ref 98–111)
Creatinine, Ser: 1.03 mg/dL — ABNORMAL HIGH (ref 0.44–1.00)
GFR, Estimated: 58 mL/min — ABNORMAL LOW (ref >60)
Glucose, Bld: 152 mg/dL — ABNORMAL HIGH (ref 70–99)
Potassium: 3.6 mmol/L (ref 3.5–5.1)
Sodium: 137 mmol/L (ref 135–145)

## 2021-08-25 LAB — GLUCOSE, CAPILLARY
Glucose-Capillary: 100 mg/dL — ABNORMAL HIGH (ref 70–99)
Glucose-Capillary: 154 mg/dL — ABNORMAL HIGH (ref 70–99)
Glucose-Capillary: 119 mg/dL — ABNORMAL HIGH (ref 70–99)
Glucose-Capillary: 166 mg/dL — ABNORMAL HIGH (ref 70–99)

## 2021-08-25 LAB — MAGNESIUM: Magnesium: 1.5 mg/dL — ABNORMAL LOW (ref 1.7–2.4)

## 2021-08-25 MED ORDER — LIDOCAINE 5 % EX PTCH
1.0000 | MEDICATED_PATCH | CUTANEOUS | Status: DC
  Administered 2021-08-25 – 2021-08-27 (×3): 1 via TRANSDERMAL
  Filled 2021-08-25 (×4): qty 1

## 2021-08-25 NOTE — Progress Notes (Signed)
Report called to receiving [id number]. Will transfer via bed.

## 2021-08-25 NOTE — TOC CM/SW Note (Signed)
RE: Brittney Ray Date of Birth: [birth year] Date: 08/25/2021   To Whom It May Concern:  Please be advised that the above-named patient will require a short-term nursing home stay - anticipated 30 days or less for rehabilitation and strengthening.  The plan is for return home.
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
## 2021-08-25 NOTE — NC FL2 (Signed)
[place] LEVEL OF CARE SCREENING TOOL     IDENTIFICATION  Patient Name: Brittney Ray Birthdate: 14-Oct-1950 Sex: female Admission Date (Current Location): 08/23/2021  [facility] [place] and [place] Number:  Engineering geologist and Address:  [facility], [address] [place], [place] [address]      Provider Number: [id number]  Attending Physician Name and Address:  Lorella Nimrod, MD  Relative Name and Phone Number:       Current Level of Care: Hospital Recommended Level of Care: [place] Prior Approval Number:    Date Approved/Denied:   PASRR Number: Manual review  Discharge Plan: SNF    Current Diagnoses: Patient Active Problem List   Diagnosis Date Noted   Pressure injury of skin 08/25/2021   Syncope and collapse 08/24/2021   Normocytic anemia 08/24/2021   Chronic kidney disease (CKD), stage III (moderate) (HCC) 08/24/2021   Hypomagnesemia 08/24/2021   Anxiety and depression 08/24/2021   TIA (transient ischemic attack) 12/03/2017   Primary osteoarthritis of both hands 08/30/2017   Trochanteric bursitis of both hips 08/30/2017   DDD (degenerative disc disease), lumbar 08/30/2017   H/O Paget's disease of bone 08/30/2017   Osteopenia of neck of right femur 03/04/2017   Primary osteoarthritis of both knees 01/10/2016   Chronic pain of both knees 01/10/2016   Obesity (BMI 30-39.9) 03/25/2015   Multinodular goiter 03/04/2015   Vaccine counseling 10/24/2014   Postprocedural hypothyroidism 10/08/2014   Breast cancer in situ 08/30/2014   Coronary artery disease 08/17/2013   NSTEMI (non-ST elevated myocardial infarction) ([place]) 08/17/2013   Acquired hypothyroidism 02/13/2013   Diabetes mellitus type 2, uncomplicated ([place]) 08/67/6195   Essential hypertension 02/13/2013   Mixed hyperlipidemia 02/13/2013   Personal history of breast cancer 02/13/2013    Orientation RESPIRATION BLADDER Height & Weight     Self,  Time, Situation, Place  Normal Incontinent, External catheter Weight: 142 lb (64.4 kg) Height:  '5\' 1"'$  (154.9 cm)  BEHAVIORAL SYMPTOMS/MOOD NEUROLOGICAL BOWEL NUTRITION STATUS   (Flat affect)  (None) Continent Diet (Heart healthy)  AMBULATORY STATUS COMMUNICATION OF NEEDS Skin   Extensive Assist Verbally Skin abrasions, Bruising, Other (Comment), PU Stage and Appropriate Care (Erythema/redness) PU Stage 1 Dressing:  (Sacrum: Foam every 3 days.)                     Personal Care Assistance Level of Assistance  Bathing, Feeding, Dressing Bathing Assistance: Maximum assistance Feeding assistance: Limited assistance Dressing Assistance: Maximum assistance     Functional Limitations Info  Sight, Hearing, Speech Sight Info: Adequate Hearing Info: Adequate Speech Info: Adequate    SPECIAL CARE FACTORS FREQUENCY  PT (By licensed PT), OT (By licensed OT)     PT Frequency: 5 x week OT Frequency: 5 x week            Contractures Contractures Info: Not present    Additional Factors Info  Code Status, Allergies, Psychotropic Code Status Info: Full code Allergies Info: Contrast Media (Iodinated Contrast Media), Codeine, Morphine and related, Sulfa Antibiotics Psychotropic Info: Anxiety, Depression         Current Medications (08/25/2021):  This is the current hospital active medication list Current Facility-Administered Medications  Medication Dose Route Frequency Provider Last Rate Last Admin   acetaminophen (TYLENOL) tablet 650 mg  650 mg Oral Q6H PRN Hugelmeyer, Alexis, DO       Or   acetaminophen (TYLENOL) suppository 650 mg  650 mg Rectal Q6H PRN Hugelmeyer,  Alexis, DO       alum & mag hydroxide-simeth (MAALOX/MYLANTA) 200-200-20 MG/5ML suspension 30 mL  30 mL Oral Q6H PRN Hugelmeyer, Alexis, DO       amLODipine (NORVASC) tablet 5 mg  5 mg Oral Daily Hugelmeyer, Alexis, DO   5 mg at 08/25/21 0930   aspirin tablet 325 mg  325 mg Oral Daily Hugelmeyer, Alexis, DO   325 mg  at 08/25/21 0929   atorvastatin (LIPITOR) tablet 40 mg  40 mg Oral QHS Hugelmeyer, Alexis, DO   40 mg at 08/24/21 2058   bisacodyl (DULCOLAX) EC tablet 5 mg  5 mg Oral Daily PRN Hugelmeyer, Alexis, DO       busPIRone (BUSPAR) tablet 5 mg  5 mg Oral BID Hugelmeyer, Alexis, DO   5 mg at 08/25/21 0930   carvedilol (COREG) tablet 3.125 mg  3.125 mg Oral BID WC Hugelmeyer, Alexis, DO   3.125 mg at 08/25/21 0929   escitalopram (LEXAPRO) tablet 10 mg  10 mg Oral Daily Hugelmeyer, Alexis, DO   10 mg at 08/25/21 0930   fenofibrate tablet 160 mg  160 mg Oral Daily Hugelmeyer, Alexis, DO   160 mg at 08/25/21 0929   ferrous [id number] C-folic acid (TRINSICON / FOLTRIN) capsule 1 capsule  1 capsule Oral BID PC Lorella Nimrod, MD   1 capsule at 08/24/21 1058   lisinopril (ZESTRIL) tablet 20 mg  20 mg Oral Daily Renda Rolls, RPH   20 mg at 08/25/21 5830   And   hydrochlorothiazide (HYDRODIURIL) tablet 12.5 mg  12.5 mg Oral Daily Renda Rolls, RPH   12.5 mg at 08/25/21 0930   HYDROcodone-acetaminophen (NORCO/VICODIN) 5-325 MG per tablet 1-2 tablet  1-2 tablet Oral Q4H PRN Hugelmeyer, Alexis, DO   2 tablet at 08/25/21 1134   insulin aspart (novoLOG) injection 0-15 Units  0-15 Units Subcutaneous TID WC Hugelmeyer, Alexis, DO   3 Units at 08/25/21 1234   insulin aspart (novoLOG) injection 0-5 Units  0-5 Units Subcutaneous QHS Hugelmeyer, Alexis, DO       levothyroxine (SYNTHROID) tablet 50 mcg  50 mcg Oral Q0600 Renda Rolls, RPH   50 mcg at 08/25/21 0518   lidocaine (LIDODERM) 5 % 1 patch  1 patch Transdermal Q24H Lorella Nimrod, MD       magnesium citrate solution 1 Bottle  1 Bottle Oral Once PRN Hugelmeyer, Alexis, DO       ondansetron (ZOFRAN) tablet 4 mg  4 mg Oral Q6H PRN Hugelmeyer, Alexis, DO       Or   ondansetron (ZOFRAN) injection 4 mg  4 mg Intravenous Q6H PRN Hugelmeyer, Alexis, DO       pantoprazole (PROTONIX) EC tablet 40 mg  40 mg Oral Daily Hugelmeyer, Alexis, DO   40 mg at  08/25/21 0930   senna-docusate (Senokot-S) tablet 1 tablet  1 tablet Oral QHS PRN Hugelmeyer, Alexis, DO       sodium chloride flush (NS) 0.9 % injection 3 mL  3 mL Intravenous Q12H Hugelmeyer, Alexis, DO   3 mL at 08/24/21 2058     Discharge Medications: Please see discharge summary for a list of discharge medications.  Relevant Imaging Results:  Relevant Lab Results:   Additional Information SS#: [id number]  Candie Chroman, LCSW
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
## 2021-08-25 NOTE — Plan of Care (Signed)
  Problem: Skin Integrity: Goal: Risk for impaired skin integrity will decrease Outcome: Progressing   Problem: Education: Goal: Knowledge of General Education information will improve Description: Including pain rating scale, medication(s)/side effects and non-pharmacologic comfort measures Outcome: Progressing   Problem: Pain Managment: Goal: General experience of comfort will improve Outcome: Progressing   Problem: Skin Integrity: Goal: Risk for impaired skin integrity will decrease Outcome: Progressing

## 2021-08-25 NOTE — Procedures (Signed)
Routine EEG Report  Brittney Ray is a 71 y.o. female with a history of syncope who is undergoing an EEG to evaluate for seizures.  Report: This EEG was acquired with electrodes placed according to the International 10-20 electrode system (including Fp1, Fp2, F3, F4, C3, C4, P3, P4, O1, O2, T3, T4, T5, T6, A1, A2, Fz, Cz, Pz). The following electrodes were missing or displaced: none.  The occipital dominant rhythm was 9 Hz. This activity is reactive to stimulation. Drowsiness was manifested by background fragmentation; deeper stages of sleep were not identified. There was no focal slowing. There were no interictal epileptiform discharges. There were no electrographic seizures identified. There was no abnormal response to photic stimulation or hyperventilation.   Impression: This EEG was obtained while awake and drowsy and is normal.    Clinical Correlation: Normal EEGs, however, do not rule out epilepsy.  Su Monks, MD [facility] [phone]  If 7pm- 7am, please page neurology on call as listed in [place].

## 2021-08-25 NOTE — TOC Initial Note (Addendum)
Transition of Care [facility]) - Initial/Assessment Note    Patient Details  Name: Brittney Ray MRN: [id number] Date of Birth: [birth year]  Transition of [facility]) CM/SW Contact:    Candie Chroman, LCSW Phone Number: 08/25/2021, 12:40 PM  Clinical Narrative:   CSW met with patient. 14 female family members at bedside. Husband came in a few minutes later. CSW introduced role and explained that PT recommendations would be discussed. Patient and her husband are agreeable to SNF placement. Gave CMS scores for facilities within 25 miles of their zip code. Will follow up with bed offers once available. PASARR under manual review. Asked MD to cosign 30-day note. No further concerns. CSW encouraged patient and her family to contact CSW as needed. CSW will continue to follow patient and her family for support and facilitate discharge to SNF once medically stable.               2:48 pm: Uploaded clinicals into [place] [facility] for PASARR review.  3:23 pm: PASARR obtained: [id number]. Expires 9/13.  Expected Discharge Plan: Skilled Nursing Facility Barriers to Discharge: Continued Medical Work up   Patient Goals and CMS Choice   CMS Medicare.gov Compare Post Acute Care list provided to:: Patient (Husband)    Expected Discharge Plan and Services Expected Discharge Plan: [place] Acute Care Choice: [place] Living arrangements for the past 2 months: Single Family Home                                      Prior Living Arrangements/Services Living arrangements for the past 2 months: Single Family Home Lives with:: Spouse Patient language and need for interpreter reviewed:: Yes Do you feel safe going back to the place where you live?: Yes      Need for Family Participation in Patient Care: Yes (Comment) Care giver support system in place?: Yes (comment) Current home services: DME Criminal Activity/Legal Involvement Pertinent to Current  Situation/Hospitalization: No - Comment as needed  Activities of Daily Living Home Assistive Devices/Equipment: None ADL Screening (condition at time of admission) Patient's cognitive ability adequate to safely complete daily activities?: Yes Is the patient deaf or have difficulty hearing?: No Does the patient have difficulty seeing, even when wearing glasses/contacts?: No Does the patient have difficulty concentrating, remembering, or making decisions?: No Patient able to express need for assistance with ADLs?: Yes Does the patient have difficulty dressing or bathing?: No Independently performs ADLs?: Yes (appropriate for developmental age) Does the patient have difficulty walking or climbing stairs?: No Weakness of Legs: Both Weakness of Arms/Hands: Both  Permission Sought/Granted Permission sought to share information with : Facility Sport and exercise psychologist, Family Supports Permission granted to share information with : Yes, Verbal Permission Granted  Share Information with NAME: Rozalia Dino  Permission granted to share info w AGENCY: SNF's  Permission granted to share info w Relationship: Spouse  Permission granted to share info w Contact Information: [phone]  Emotional Assessment Appearance:: Appears stated age Attitude/Demeanor/Rapport: Engaged Affect (typically observed): Accepting, Calm Orientation: : Oriented to Self, Oriented to Place, Oriented to  Time, Oriented to Situation Alcohol / Substance Use: Not Applicable Psych Involvement: No (comment)  Admission diagnosis:  Syncope and collapse [R55] Weakness [R53.1] Fall, initial encounter [W19.XXXA] Patient Active Problem List   Diagnosis Date Noted   Pressure injury of skin 08/25/2021   Syncope and collapse 08/24/2021  Normocytic anemia 08/24/2021   Chronic kidney disease (CKD), stage III (moderate) (HCC) 08/24/2021   Hypomagnesemia 08/24/2021   Anxiety and depression 08/24/2021   TIA (transient ischemic  attack) 12/03/2017   Primary osteoarthritis of both hands 08/30/2017   Trochanteric bursitis of both hips 08/30/2017   DDD (degenerative disc disease), lumbar 08/30/2017   H/O Paget's disease of bone 08/30/2017   Osteopenia of neck of right femur 03/04/2017   Primary osteoarthritis of both knees 01/10/2016   Chronic pain of both knees 01/10/2016   Obesity (BMI 30-39.9) 03/25/2015   Multinodular goiter 03/04/2015   Vaccine counseling 10/24/2014   Postprocedural hypothyroidism 10/08/2014   Breast cancer in situ 08/30/2014   Coronary artery disease 08/17/2013   NSTEMI (non-ST elevated myocardial infarction) ([place]) 08/17/2013   Acquired hypothyroidism 02/13/2013   Diabetes mellitus type 2, uncomplicated ([place]) 14/70/9295   Essential hypertension 02/13/2013   Mixed hyperlipidemia 02/13/2013   Personal history of breast cancer 02/13/2013   PCP:  Dion Body, MD Pharmacy:   [facility] [address], [place] [address] [place] [address] Phone: [phone] Fax: [phone]     Social Determinants of Health (SDOH) Interventions    Readmission Risk Interventions     No data to display

## 2021-08-25 NOTE — Progress Notes (Signed)
Eeg done 

## 2021-08-25 NOTE — Care Management Obs Status (Signed)
[facility] NOTIFICATION   Patient Details  Name: Brittney Ray MRN: [id number] Date of Birth: 1950-12-26   Medicare Observation Status Notification Given:  Yes    Candie Chroman, LCSW 08/25/2021, 12:21 PM

## 2021-08-25 NOTE — Evaluation (Addendum)
Occupational Therapy Evaluation Patient Details Name: Brittney Ray MRN: [id number] DOB: Jun 13, 1950 Today's Date: 08/25/2021   History of Present Illness Pt is a 71 y/o F admitted on 08/23/21 after presenting to the ED for evaluation following multiple falls 2/2 worsening confusion & weakness. CT of head & cervical spine were negative for acute bony abnormality. PMH: DM, HTN, hypothyroidism, anxiety, breast CA   Clinical Impression   Pt was seen for OT evaluation and co-tx with PT this date. Prior to admission pt resided with husband in a 1 level home with 3 steps without rails to enter, ambulated with RW, sister assisted with bathing, & pt with recent hx of falls. Pt presents to acute OT demonstrating impaired ADL performance and functional mobility 2/2 decreased strength, activity tolerance, rib pain, dizziness, and cognition (See OT problem list). Orthostatic BP's taken with stable BP's and HR. Pt currently requires MOD A for bed mobility, MIN A +2 for safety with ADL tranfers, and MAX A For LB ADL tasks.  Pt would benefit from skilled OT services to address noted impairments and functional limitations (see below for any additional details) in order to maximize safety and independence while minimizing falls risk and caregiver burden. Upon hospital discharge, recommend STR to maximize pt safety and return to PLOF.     Recommendations for follow up therapy are one component of a multi-disciplinary discharge planning process, led by the attending physician.  Recommendations may be updated based on patient status, additional functional criteria and insurance authorization.   Follow Up Recommendations  Skilled nursing-short term rehab (<3 hours/day)    Assistance Recommended at Discharge Frequent or constant Supervision/Assistance  Patient can return home with the following Assistance with cooking/housework;Assist for transportation;Help with stairs or ramp for entrance;Direct supervision/assist  for medications management;A lot of help with walking and/or transfers;A lot of help with bathing/dressing/bathroom    Functional Status Assessment  Patient has had a recent decline in their functional status and demonstrates the ability to make significant improvements in function in a reasonable and predictable amount of time.  Equipment Recommendations  Other (comment) (defer to next venue, likely benefit from 2WW, [facility])    Recommendations for Other Services       Precautions / Restrictions Precautions Precautions: Fall Restrictions Weight Bearing Restrictions: No      Mobility Bed Mobility Overal bed mobility: Needs Assistance Bed Mobility: Supine to Sit     Supine to sit: Mod assist     General bed mobility comments: MOD A from PT    Transfers Overall transfer level: Needs assistance Equipment used: Rolling walker (2 wheels) Transfers: Sit to/from Stand Sit to Stand: Min assist, +2 safety/equipment                  Balance Overall balance assessment: Needs assistance, History of Falls Sitting-balance support: Bilateral upper extremity supported, Feet unsupported Sitting balance-Leahy Scale: Fair     Standing balance support: Bilateral upper extremity supported Standing balance-Leahy Scale: Poor                             ADL either performed or assessed with clinical judgement   ADL                                         General ADL Comments: Pt requires MAX A For LB ADL Tasks,  MIN A For UB ADL Tasks     Vision         Perception     Praxis      Pertinent Vitals/Pain Pain Assessment Pain Assessment: 0-10 Pain Score: 10-Worst pain ever Pain Location: R ribs Pain Descriptors / Indicators: Grimacing, Discomfort Pain Intervention(s): Limited activity within patient's tolerance, Monitored during session, Repositioned, Patient requesting pain meds-RN notified     Hand Dominance     Extremity/Trunk Assessment  Upper Extremity Assessment Upper Extremity Assessment: Generalized weakness   Lower Extremity Assessment Lower Extremity Assessment: Generalized weakness       Communication Communication Communication: No difficulties   Cognition Arousal/Alertness: Awake/alert Behavior During Therapy: WFL for tasks assessed/performed Overall Cognitive Status: History of cognitive impairments - at baseline                                       General Comments       Exercises     Shoulder Instructions      Home Living Family/patient expects to be discharged to:: Private residence Living Arrangements: Spouse/significant other Available Help at Discharge: Family;Available 24 hours/day Type of Home: House Home Access: Stairs to enter [employer] of Steps: 3 Entrance Stairs-Rails: None Home Layout: One level               Home Equipment: Conservation officer, nature (2 wheels)          Prior Functioning/Environment               Mobility Comments: Pt has been ambulatory with RW without physical assistance but many falls prior to admission. Has been requesting assistance for getting OOB at home but sister encouraging pt to complete herself. Not driving. ADLs Comments: Sister assists with bathing. Incontinent at baseline.        OT Problem List: Decreased strength;Decreased activity tolerance;Impaired balance (sitting and/or standing);Decreased knowledge of use of DME or AE;Decreased cognition      OT Treatment/Interventions: Self-care/ADL training;Therapeutic exercise;Therapeutic activities;DME and/or AE instruction;Patient/family education;Balance training    OT Goals(Current goals can be found in the care plan section) Acute Rehab OT Goals Patient Stated Goal: get better OT Goal Formulation: With patient Time For Goal Achievement: 09/08/21 Potential to Achieve Goals: Good ADL Goals Pt Will Perform Upper Body Dressing: with supervision;with  set-up;sitting Pt Will Perform Lower Body Dressing: with min assist;sit to/from stand Pt Will Transfer to Toilet: with supervision;ambulating (LRAD) Pt Will Perform Toileting - Clothing Manipulation and hygiene: with set-up;with supervision;sitting/lateral leans  OT Frequency: Min 2X/week    Co-evaluation PT/OT/SLP Co-Evaluation/Treatment: Yes Reason for Co-Treatment: For patient/therapist safety;To address functional/ADL transfers PT goals addressed during session: Mobility/safety with mobility;Strengthening/ROM OT goals addressed during session: ADL's and self-care      AM-PAC OT "6 Clicks" Daily Activity     Outcome Measure Help from another person eating meals?: None Help from another person taking care of personal grooming?: A Little Help from another person toileting, which includes using toliet, bedpan, or urinal?: A Lot Help from another person bathing (including washing, rinsing, drying)?: A Lot Help from another person to put on and taking off regular upper body clothing?: A Little Help from another person to put on and taking off regular lower body clothing?: A Lot 6 Click Score: 16   End of Session Equipment Utilized During Treatment: Gait belt;Rolling walker (2 wheels)  Activity Tolerance: Patient tolerated treatment well Patient  left: in chair;with call bell/phone within reach;with chair alarm set;with family/visitor present  OT Visit Diagnosis: Other abnormalities of gait and mobility (R26.89);Muscle weakness (generalized) (M62.81)                Time: 1610-9604 OT Time Calculation (min): 15 min Charges:  OT General Charges $OT Visit: 1 Visit OT Evaluation $OT Eval Moderate Complexity: 1 Mod  Ardeth Perfect., MPH, MS, OTR/L ascom [phone] 08/25/21, 2:08 PM

## 2021-08-25 NOTE — Progress Notes (Signed)
Physical Therapy Treatment Patient Details Name: Brittney Ray MRN: [id number] DOB: 1950/05/18 Today's Date: 08/25/2021   History of Present Illness Pt is a 71 y/o F admitted on 08/23/21 after presenting to the ED for evaluation following multiple falls 2/2 worsening confusion & weakness. CT of head & cervical spine were negative for acute bony abnormality. PMH: DM, HTN, hypothyroidism, anxiety, breast CA    PT Comments    OT in room upon arrival.  Co-tx for pt safety based on PT session yesterday.  Orthostatic BP's taken with stable BP's and HR.  See flow sheets.  She does c/o some dizziness but no darkening of room.  She does have some increased respirations which she attributes to anxiety which she says she does have at baseline.    Mod a x 1 for bed mobility with cues for hand placements.  Steady in sitting but supervision is given for safety.  She is able to stand to complete orthostatic BP's and while +2 assist is available for standing and transfers it is not physically needed and she does not have any episodes of stopping or staring off as she did yesterday.  Seated AROM.  Overall she did do better but remains quite limited due to pain and dizziness which does not seem to be attributed to orthostatics per BP readings. RN aware and of request for pain meds.  Family in room.   Recommendations for follow up therapy are one component of a multi-disciplinary discharge planning process, led by the attending physician.  Recommendations may be updated based on patient status, additional functional criteria and insurance authorization.  Follow Up Recommendations  Skilled nursing-short term rehab (<3 hours/day)     Assistance Recommended at Discharge Frequent or constant Supervision/Assistance  Patient can return home with the following A lot of help with walking and/or transfers;A lot of help with bathing/dressing/bathroom;Assistance with cooking/housework;Assist for transportation;Direct  supervision/assist for medications management;Help with stairs or ramp for entrance   Equipment Recommendations  None recommended by PT    Recommendations for Other Services       Precautions / Restrictions Precautions Precautions: Fall Restrictions Weight Bearing Restrictions: No     Mobility  Bed Mobility Overal bed mobility: Needs Assistance Bed Mobility: Supine to Sit     Supine to sit: Mod assist          Transfers Overall transfer level: Needs assistance Equipment used: Rolling walker (2 wheels) Transfers: Sit to/from Stand Sit to Stand: Min assist, +2 safety/equipment                Ambulation/Gait Ambulation/Gait assistance: Min assist, +2 safety/equipment Gait Distance (Feet): 3 Feet Assistive device: Rolling walker (2 wheels) Gait Pattern/deviations: Step-to pattern Gait velocity: dec     General Gait Details: steps to chair limited bu pain and fatigue.   Stairs             Wheelchair Mobility    Modified Rankin (Stroke Patients Only)       Balance Overall balance assessment: Needs assistance, History of Falls Sitting-balance support: Bilateral upper extremity supported, Feet unsupported Sitting balance-Leahy Scale: Fair     Standing balance support: Bilateral upper extremity supported Standing balance-Leahy Scale: Poor Standing balance comment: close min guard/assist for safety                            Cognition Arousal/Alertness: Awake/alert Behavior During Therapy: WFL for tasks assessed/performed Overall Cognitive Status: History of cognitive  impairments - at baseline                                          Exercises Other Exercises Other Exercises: seated AROM    General Comments        Pertinent Vitals/Pain Pain Assessment Pain Assessment: Faces Faces Pain Scale: Hurts worst Pain Location: R ribs Pain Descriptors / Indicators: Grimacing, Discomfort Pain Intervention(s):  Limited activity within patient's tolerance, Monitored during session, Repositioned, Patient requesting pain meds-RN notified (initially declined medicine but after mobility agrees)    Home Living                          Prior Function            PT Goals (current goals can now be found in the care plan section) Progress towards PT goals: Progressing toward goals    Frequency    Min 2X/week      PT Plan Current plan remains appropriate    Co-evaluation PT/OT/SLP Co-Evaluation/Treatment: Yes Reason for Co-Treatment: For patient/therapist safety PT goals addressed during session: Mobility/safety with mobility;Strengthening/ROM OT goals addressed during session: Other (comment) (eval)      AM-PAC PT "6 Clicks" Mobility   Outcome Measure  Help needed turning from your back to your side while in a flat bed without using bedrails?: A Lot Help needed moving from lying on your back to sitting on the side of a flat bed without using bedrails?: A Lot Help needed moving to and from a bed to a chair (including a wheelchair)?: A Lot Help needed standing up from a chair using your arms (e.g., wheelchair or bedside chair)?: A Little Help needed to walk in hospital room?: A Lot Help needed climbing 3-5 steps with a railing? : Total 6 Click Score: 12    End of Session Equipment Utilized During Treatment: Gait belt Activity Tolerance: Patient limited by pain Patient left: in chair;with call bell/phone within reach;with chair alarm set Nurse Communication: Mobility status;Patient requests pain meds;Other (comment) PT Visit Diagnosis: Unsteadiness on feet (R26.81);History of falling (Z91.81);Other abnormalities of gait and mobility (R26.89)     Time: 8185-6314 PT Time Calculation (min) (ACUTE ONLY): 13 min  Charges:  $Therapeutic Activity: 8-22 mins                   Chesley Noon, PTA 08/25/21, 1:15 PM

## 2021-08-26 DIAGNOSIS — R55 Syncope and collapse: Secondary | ICD-10-CM | POA: Diagnosis not present

## 2021-08-26 LAB — URINALYSIS, ROUTINE W REFLEX MICROSCOPIC
Bilirubin Urine: NEGATIVE
Glucose, UA: NEGATIVE mg/dL
Hgb urine dipstick: NEGATIVE
Ketones, ur: NEGATIVE mg/dL
Nitrite: NEGATIVE
Protein, ur: NEGATIVE mg/dL
Specific Gravity, Urine: 1.025 (ref 1.005–1.030)
pH: 5 (ref 5.0–8.0)

## 2021-08-26 LAB — GLUCOSE, CAPILLARY
Glucose-Capillary: 112 mg/dL — ABNORMAL HIGH (ref 70–99)
Glucose-Capillary: 127 mg/dL — ABNORMAL HIGH (ref 70–99)
Glucose-Capillary: 105 mg/dL — ABNORMAL HIGH (ref 70–99)
Glucose-Capillary: 154 mg/dL — ABNORMAL HIGH (ref 70–99)

## 2021-08-26 NOTE — Progress Notes (Signed)
Physical Therapy Treatment Patient Details Name: Brittney Ray MRN: [id number] DOB: 06-10-1950 Today's Date: 08/26/2021   History of Present Illness Pt is a 71 y/o F admitted on 08/23/21 after presenting to the ED for evaluation following multiple falls 2/2 worsening confusion & weakness. CT of head & cervical spine were negative for acute bony abnormality. PMH: DM, HTN, hypothyroidism, anxiety, breast CA    PT Comments    Pt initially seemed very eager and willing to get up/work with PT but was ultimately very pain limited with all tasks.  She needed a lot of help to attain sitting EOB, but once up needed only light assist to transition to the [facility].  She struggled with coordinating safe set up/hand placement etc and did not control descent to [facility] (similarly she later did not control descent to recliner and then, after brief rest break, did not control getting back into the bed.  Pt showed impulsive, halting, unsteady gait and had consistent veering to the L running into unmoving objects multiple times during our brief ambulation effort.  Pt consistently c/o rib pain t/o the session, decent effort but simply unable to do a lot of functional activity.  Recommendations for follow up therapy are one component of a multi-disciplinary discharge planning process, led by the attending physician.  Recommendations may be updated based on patient status, additional functional criteria and insurance authorization.  Follow Up Recommendations  Skilled nursing-short term rehab (<3 hours/day) Can patient physically be transported by private vehicle: No   Assistance Recommended at Discharge Frequent or constant Supervision/Assistance  Patient can return home with the following A lot of help with walking and/or transfers;A lot of help with bathing/dressing/bathroom;Assistance with cooking/housework;Assist for transportation;Direct supervision/assist for medications management;Help with stairs or ramp for  entrance   Equipment Recommendations  None recommended by PT    Recommendations for Other Services       Precautions / Restrictions Precautions Precautions: Fall Restrictions Weight Bearing Restrictions: No     Mobility  Bed Mobility Overal bed mobility: Needs Assistance Bed Mobility: Supine to Sit, Sit to Supine     Supine to sit: Mod assist Sit to supine: Mod assist, Max assist   General bed mobility comments: Pt c/o severe pain with transitions, showed some effort but ultimately needed considerable physical assist to get to/from sitting    Transfers Overall transfer level: Needs assistance Equipment used: Rolling walker (2 wheels) Transfers: Sit to/from Stand Sit to Stand: Min assist           General transfer comment: Cues for set up/hand placement, light assist to get to standing, no control back to sitting with crash into recliner and later back to bed w/o UE use despite cuing and CGA    Ambulation/Gait Ambulation/Gait assistance: Min assist, Mod assist Gait Distance (Feet): 12 Feet Assistive device: Rolling walker (2 wheels)         General Gait Details: Pt was able to put together a few feet of ambulation, but ran unto stationary objects multiple times in the brief ambulation bout... despite much directional cuing pt unable to make adjustments   Stairs             Wheelchair Mobility    Modified Rankin (Stroke Patients Only)       Balance   Sitting-balance support: Bilateral upper extremity supported, Feet unsupported Sitting balance-Leahy Scale: Fair     Standing balance support: Bilateral upper extremity supported Standing balance-Leahy Scale: Poor Standing balance comment: close min guard/assist for  safety - impulsive/poor awareness                            Cognition Arousal/Alertness: Awake/alert Behavior During Therapy: Impulsive Overall Cognitive Status: History of cognitive impairments - at baseline                                  General Comments: poor safety awareness, impulsive, easily distractable        Exercises      General Comments        Pertinent Vitals/Pain Pain Assessment Pain Assessment: Faces Faces Pain Scale: Hurts whole lot Pain Location: R ribs Pain Descriptors / Indicators: Grimacing, Discomfort    Home Living                          Prior Function            PT Goals (current goals can now be found in the care plan section) Acute Rehab PT Goals Patient Stated Goal: "I'm hurting" Progress towards PT goals: Progressing toward goals    Frequency    Min 2X/week      PT Plan Current plan remains appropriate    Co-evaluation              AM-PAC PT "6 Clicks" Mobility   Outcome Measure  Help needed turning from your back to your side while in a flat bed without using bedrails?: A Lot Help needed moving from lying on your back to sitting on the side of a flat bed without using bedrails?: A Lot Help needed moving to and from a bed to a chair (including a wheelchair)?: A Lot Help needed standing up from a chair using your arms (e.g., wheelchair or bedside chair)?: A Little Help needed to walk in hospital room?: A Lot Help needed climbing 3-5 steps with a railing? : Total 6 Click Score: 12    End of Session Equipment Utilized During Treatment: Gait belt Activity Tolerance: Patient limited by pain Patient left: with bed alarm set;with call bell/phone within reach Nurse Communication: Mobility status;Patient requests pain meds PT Visit Diagnosis: Unsteadiness on feet (R26.81);History of falling (Z91.81);Other abnormalities of gait and mobility (R26.89)     Time: 6144-3154 PT Time Calculation (min) (ACUTE ONLY): 27 min  Charges:  $Gait Training: 8-22 mins $Therapeutic Activity: 8-22 mins                     Kreg Shropshire, DPT 08/26/2021, 6:06 PM
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
## 2021-08-26 NOTE — TOC Progression Note (Signed)
Transition of Care [facility]) - Progression Note    Patient Details  Name: Brittney Ray MRN: [id number] Date of Birth: [birth year]  Transition of Care [facility]) CM/SW Contact  Laurena Slimmer, RN Phone Number: 08/26/2021, 10:47 PM  Clinical Narrative:    Spoke with patient, and husband and bedside. Patient given bed offers. Patient prefers [facility]. Contacted Tam[facility] Russell at [facility].  [facility] bed offer accepted. Navi auth started.    Expected Discharge Plan: [place] Barriers to Discharge: Continued Medical Work up  Expected Discharge Plan and Services Expected Discharge Plan: [place] Choice: [place] arrangements for the past 2 months: Single Family Home                                       Social Determinants of Health (SDOH) Interventions    Readmission Risk Interventions     No data to display
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
## 2021-08-27 DIAGNOSIS — R55 Syncope and collapse: Secondary | ICD-10-CM | POA: Diagnosis not present

## 2021-08-27 LAB — GLUCOSE, CAPILLARY
Glucose-Capillary: 116 mg/dL — ABNORMAL HIGH (ref 70–99)
Glucose-Capillary: 105 mg/dL — ABNORMAL HIGH (ref 70–99)
Glucose-Capillary: 199 mg/dL — ABNORMAL HIGH (ref 70–99)

## 2021-08-27 MED ORDER — LIDOCAINE 5 % EX PTCH: 1.0000 | MEDICATED_PATCH | CUTANEOUS | 0 refills | Status: DC

## 2021-08-27 MED ORDER — FE FUMARATE-B12-VIT C-FA-IFC PO CAPS: 1.0000 | ORAL_CAPSULE | Freq: Two times a day (BID) | ORAL | Status: DC
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
## 2021-08-27 NOTE — Plan of Care (Signed)
Patient discharged per MD orders at this time.All discharge instructions, education and medications reviewed with the patient.Pt expressed understanding and will comply with dc instructions.f/u appointments was communicated to the patient.no verbal c/o or any ssx of distress.Pt was discharged to the [facility] for PT/OT services per order.report was called to staff nurse Monika Salk before transport.Pt was transported by 2 [facility] personnel on a stretcher.

## 2021-08-27 NOTE — Progress Notes (Signed)
Spoke with Tammy from [facility]. Facility will accept patient today. Patient ,family, nurse and MD notified. Discharge order received. Discharge summary and transfer report sent to facility via hub. EMS arranged. TOC signing off.

## 2021-08-27 NOTE — Plan of Care (Signed)
  Problem: Education: Goal: Ability to describe self-care measures that may prevent or decrease complications (Diabetes Survival Skills Education) will improve Outcome: Progressing Goal: Individualized Educational Video(s) Outcome: Progressing   Problem: Coping: Goal: Ability to adjust to condition or change in health will improve Outcome: Progressing   Problem: Fluid Volume: Goal: Ability to maintain a balanced intake and output will improve Outcome: Progressing   Problem: Health Behavior/Discharge Planning: Goal: Ability to identify and utilize available resources and services will improve Outcome: Progressing Goal: Ability to manage health-related needs will improve Outcome: Progressing   Problem: Metabolic: Goal: Ability to maintain appropriate glucose levels will improve Outcome: Progressing   Problem: Nutritional: Goal: Maintenance of adequate nutrition will improve Outcome: Progressing Goal: Progress toward achieving an optimal weight will improve Outcome: Progressing   Problem: Skin Integrity: Goal: Risk for impaired skin integrity will decrease Outcome: Progressing   Problem: Tissue Perfusion: Goal: Adequacy of tissue perfusion will improve Outcome: Progressing   Problem: Health Behavior/Discharge Planning: Goal: Ability to manage health-related needs will improve Outcome: Progressing   Problem: Clinical Measurements: Goal: Ability to maintain clinical measurements within normal limits will improve Outcome: Progressing Goal: Will remain free from infection Outcome: Progressing Goal: Diagnostic test results will improve Outcome: Progressing Goal: Respiratory complications will improve Outcome: Progressing Goal: Cardiovascular complication will be avoided Outcome: Progressing   Problem: Activity: Goal: Risk for activity intolerance will decrease Outcome: Progressing   Problem: Nutrition: Goal: Adequate nutrition will be maintained Outcome:  Progressing   Problem: Coping: Goal: Level of anxiety will decrease Outcome: Progressing   Problem: Elimination: Goal: Will not experience complications related to bowel motility Outcome: Progressing Goal: Will not experience complications related to urinary retention Outcome: Progressing   Problem: Pain Managment: Goal: General experience of comfort will improve Outcome: Progressing   Problem: Safety: Goal: Ability to remain free from injury will improve Outcome: Progressing   Problem: Skin Integrity: Goal: Risk for impaired skin integrity will decrease Outcome: Progressing   

## 2021-10-07 ENCOUNTER — Ambulatory Visit: Admission: RE | Admit: 2021-10-07 | Payer: Medicare Other | Source: Ambulatory Visit

## 2021-10-07 ENCOUNTER — Encounter: Admission: RE | Payer: Self-pay | Source: Ambulatory Visit

## 2021-10-07 SURGERY — COLONOSCOPY WITH PROPOFOL
Anesthesia: General

## 2021-11-12 ENCOUNTER — Emergency Department
Admission: EM | Admit: 2021-11-12 | Discharge: 2021-11-12 | Disposition: A | Discharge status: Home / Self Care | Payer: Medicare Other | Source: Home / Self Care | Attending: Emergency Medicine | Admitting: Emergency Medicine

## 2021-11-12 ENCOUNTER — Emergency Department: Payer: Medicare Other

## 2021-11-12 ENCOUNTER — Other Ambulatory Visit: Payer: Self-pay

## 2021-11-12 ENCOUNTER — Emergency Department
Admission: EM | Admit: 2021-11-12 | Discharge: 2021-11-12 | Disposition: A | Discharge status: Home / Self Care | Payer: Medicare Other | Attending: Student in an Organized Health Care Education/Training Program | Admitting: Student in an Organized Health Care Education/Training Program

## 2021-11-12 DIAGNOSIS — R410 Disorientation, unspecified: Secondary | ICD-10-CM | POA: Diagnosis present

## 2021-11-12 DIAGNOSIS — Z7982 Long term (current) use of aspirin: Secondary | ICD-10-CM | POA: Insufficient documentation

## 2021-11-12 DIAGNOSIS — S0101XA Laceration without foreign body of scalp, initial encounter: Secondary | ICD-10-CM | POA: Insufficient documentation

## 2021-11-12 DIAGNOSIS — W01198A Fall on same level from slipping, tripping and stumbling with subsequent striking against other object, initial encounter: Secondary | ICD-10-CM | POA: Insufficient documentation

## 2021-11-12 DIAGNOSIS — S0990XA Unspecified injury of head, initial encounter: Secondary | ICD-10-CM

## 2021-11-12 LAB — URINALYSIS, ROUTINE W REFLEX MICROSCOPIC
Bilirubin Urine: NEGATIVE
Glucose, UA: NEGATIVE mg/dL
Hgb urine dipstick: NEGATIVE
Ketones, ur: NEGATIVE mg/dL
Nitrite: NEGATIVE
Protein, ur: NEGATIVE mg/dL
Specific Gravity, Urine: 1.025 (ref 1.005–1.030)
pH: 5 (ref 5.0–8.0)

## 2021-11-12 LAB — BASIC METABOLIC PANEL
Anion gap: 7 (ref 5–15)
BUN: 35 mg/dL — ABNORMAL HIGH (ref 8–23)
CO2: 24 mmol/L (ref 22–32)
Calcium: 9 mg/dL (ref 8.9–10.3)
Chloride: 108 mmol/L (ref 98–111)
Creatinine, Ser: 1.13 mg/dL — ABNORMAL HIGH (ref 0.44–1.00)
GFR, Estimated: 52 mL/min — ABNORMAL LOW (ref >60)
Glucose, Bld: 118 mg/dL — ABNORMAL HIGH (ref 70–99)
Potassium: 3.5 mmol/L (ref 3.5–5.1)
Sodium: 139 mmol/L (ref 135–145)

## 2021-11-12 LAB — CBC
HCT: 37.7 % (ref 36.0–46.0)
Hemoglobin: 12 g/dL (ref 12.0–15.0)
MCH: 26.3 pg (ref 26.0–34.0)
MCHC: 31.8 g/dL (ref 30.0–36.0)
MCV: 82.5 fL (ref 80.0–100.0)
Platelets: 188 10*3/uL (ref 150–400)
RBC: 4.57 MIL/uL (ref 3.87–5.11)
RDW: 15.4 % (ref 11.5–15.5)
WBC: 6.3 10*3/uL (ref 4.0–10.5)
nRBC: 0 % (ref 0.0–0.2)

## 2021-11-12 LAB — TROPONIN I (HIGH SENSITIVITY)
Troponin I (High Sensitivity): 7 ng/L (ref <18)
Troponin I (High Sensitivity): 7 ng/L (ref <18)

## 2021-11-12 MED ORDER — ESCITALOPRAM OXALATE 10 MG PO TABS: 10.0000 mg | ORAL_TABLET | Freq: Every day | ORAL | 2 refills | Status: AC

## 2021-11-12 MED ORDER — LIDOCAINE-EPINEPHRINE 1 %-1:100000 IJ SOLN
30.0000 mL | Freq: Once | INTRAMUSCULAR | Status: AC
  Administered 2021-11-12: 30 mL
  Filled 2021-11-12: qty 2

## 2021-11-12 MED ORDER — DIAZEPAM 2 MG PO TABS
2.0000 mg | ORAL_TABLET | Freq: Once | ORAL | Status: AC
Start: 2021-11-12 — End: 2021-11-12
  Administered 2021-11-12: 2 mg via ORAL
  Filled 2021-11-12: qty 1

## 2021-11-12 NOTE — ED Notes (Signed)
Pt to ED for ongoing weakness and difficulty walking since 1 year.

## 2021-11-12 NOTE — Discharge Instructions (Signed)
Please wait 24 hours before showering.  Follow-up with primary care provider, walk-in clinic or urgent care in 1 week for staple removal.  Return to the ER for any worsening symptoms or any headaches, vision changes, weakness, urgent changes in health

## 2021-11-12 NOTE — ED Notes (Signed)
Pt is under the care of a neurologist and is being evaluated for parkinson's, family member at bedside states that she had been having a lot of falls and having a hard time walking, is currently getting in home physical therapy occupational therapy and speech therapy, pt has lost weight recently as well. Pt states that she woke up this am and her head feels weird, states that it didn't feel like this last pm, but was there when she woke up, denies dizziness or headache and is unable to describe how weird feels to her.

## 2021-11-12 NOTE — ED Triage Notes (Addendum)
Pt presents to ED with /co of having a fall. Pt has a lac to L side of head. Pt states she tripped over her walker. Pt denies blood thinners. Pt denies LOC. Bleeding controlled. Wound care done in triage to help better visualize area.   Pt seen today for confusion and discharged. Pt is A&Ox4 at this time. Husband with pt.

## 2021-11-12 NOTE — Discharge Instructions (Signed)
Follow-up with Dr. Manuella Ghazi as well is Dr. Netty Starring.  Return for any additional questions or concerns.

## 2021-11-12 NOTE — ED Provider Notes (Signed)
[facility] EMERGENCY DEPARTMENT Provider Note   CSN: [id number] Arrival date & time: 11/12/21  1833     History  Chief Complaint  Patient presents with   [facility] Laceration    Brittney Ray is a 71 y.o. female presents to the emergency department for evaluation of fall, head laceration.  Patient tripped over her walker just prior to arrival, fell and hit her head and denies loss of consciousness nausea vomiting or headache.  She has laceration to the left frontal scalp.  Bleeding controlled.  She remains ambulatory with assistance.  She was here earlier today for head discomfort but had negative CT scan and states she was feeling fine after going home.  She denies any numbness tingling radicular symptoms.  No neck pain.  No pain to her upper or lower extremities.  She is not on blood thinners  HPI     Home Medications Prior to Admission medications   Medication Sig Start Date End Date Taking? Authorizing Provider  ACCU-CHEK AVIVA PLUS test strip USE 1 STRIP TO CHECK GLUCOSE THREE TIMES DAILY 12/03/17   [provider]  alendronate (FOSAMAX) 70 MG tablet Take 70 mg by mouth once a week. Take with a full glass of water on an empty stomach.    [provider]  amLODipine (NORVASC) 5 MG tablet Take 5 mg by mouth daily.    [provider]  aspirin 325 MG tablet Take 325 mg by mouth daily.     [provider]  atorvastatin (LIPITOR) 40 MG tablet Take 40 mg by mouth at bedtime.    [provider]  busPIRone (BUSPAR) 5 MG tablet Take 5 mg by mouth 2 (two) times daily.    [provider]  Calcium Carb-Cholecalciferol 600-800 MG-UNIT TABS Take 1 tablet by mouth daily with breakfast.     [provider]  carvedilol (COREG) 3.125 MG tablet Take 3.125 mg by mouth 2 (two) times daily with a meal.    [provider]  escitalopram (LEXAPRO) 10 MG tablet Take 1 tablet (10 mg total) by mouth  daily. 11/12/21 02/10/22  Merlyn Lot, MD  fenofibrate 160 MG tablet Take 1 tablet (160 mg total) by mouth daily. 12/05/17   Salary, Holly Bodily D, MD  ferrous [id number] C-folic acid (TRINSICON / FOLTRIN) capsule Take 1 capsule by mouth 2 (two) times daily after a meal. 08/27/21   Lorella Nimrod, MD  Glucose Blood (ACCU-CHEK AVIVA PLUS VI) 1 each as directed 07/25/14   [provider]  levothyroxine (SYNTHROID, LEVOTHROID) 50 MCG tablet Take 50 mcg by mouth daily.     [provider]  lidocaine (LIDODERM) 5 % Place 1 patch onto the skin daily. Remove & Discard patch within 12 hours or as directed by MD 08/27/21   Lorella Nimrod, MD  lisinopril-hydrochlorothiazide (ZESTORETIC) 20-12.5 MG tablet Take 1 tablet by mouth daily.    [provider]  metFORMIN (GLUCOPHAGE) 500 MG tablet Take 500 mg by mouth 2 (two) times daily with a meal.    [provider]  pantoprazole (PROTONIX) 40 MG tablet Take 40 mg by mouth daily.     [provider]      Allergies    Contrast media [iodinated contrast media], Codeine, Morphine and related, and Sulfa antibiotics    Review of Systems   Review of Systems  Physical Exam Updated Vital Signs BP (!) 144/77 (BP Location: Right Arm)   Pulse 86   Temp 98.4  F (36.9 C) (Oral)   Resp 17   SpO2 100%  Physical Exam Constitutional:      Appearance: She is well-developed.  HENT:     Head: Normocephalic.     Comments: 4 cm laceration to the left frontal scalp.  Laceration gaping, bleeding well controlled.    Right Ear: External ear normal.     Left Ear: External ear normal.     Nose: Nose normal.  Eyes:     Conjunctiva/sclera: Conjunctivae normal.     Pupils: Pupils are equal, round, and reactive to light.  Cardiovascular:     Rate and Rhythm: Normal rate.  Pulmonary:     Effort: Pulmonary effort is normal. No respiratory distress.     Breath sounds: Normal breath sounds.  Abdominal:     Palpations:  Abdomen is soft.     Tenderness: There is no abdominal tenderness.  Musculoskeletal:        General: No deformity. Normal range of motion.     Cervical back: Normal range of motion.     Comments: No pain with hip or knee range of motion or palpation.  No tenderness along the shoulders elbows wrist or digits.  Skin:    General: Skin is warm and dry.     Findings: No rash.  Neurological:     General: No focal deficit present.     Mental Status: She is alert and oriented to person, place, and time.     Cranial Nerves: No cranial nerve deficit.     Coordination: Coordination normal.  Psychiatric:        Behavior: Behavior normal.     ED Results / Procedures / Treatments   Labs (all labs ordered are listed, but only abnormal results are displayed) Labs Reviewed - No data to display  EKG None  Radiology CT HEAD WO CONTRAST (5MM)  Result Date: 11/12/2021 CLINICAL DATA:  Head trauma, moderate-severe Fall; Facial trauma, blunt, fall, EXAM: CT HEAD WITHOUT CONTRAST CT MAXILLOFACIAL WITHOUT CONTRAST CT CERVICAL SPINE WITHOUT CONTRAST TECHNIQUE: Multidetector CT imaging of the head, cervical spine, and maxillofacial structures were performed using the standard protocol without intravenous contrast. Multiplanar CT image reconstructions of the cervical spine and maxillofacial structures were also generated. RADIATION DOSE REDUCTION: This exam was performed according to the departmental dose-optimization program which includes automated exposure control, adjustment of the mA and/or kV according to patient size and/or use of iterative reconstruction technique. COMPARISON:  None Available. FINDINGS: CT HEAD FINDINGS Brain: Normal anatomic configuration. Parenchymal volume loss is commensurate with the patient's age. Mild periventricular white matter changes are present likely reflecting the sequela of small vessel ischemia. No abnormal intra or extra-axial mass lesion or fluid collection. No abnormal  mass effect or midline shift. No evidence of acute intracranial hemorrhage or infarct. Ventricular size is normal. Cerebellum unremarkable. Vascular: No asymmetric hyperdense vasculature at the skull base. Skull: Intact Other: Mastoid air cells and middle ear cavities are clear. CT MAXILLOFACIAL FINDINGS Osseous: Healed fracture of the nasal bones with minimal residual deformity. No acute facial fracture. No mandibular dislocation. Orbits: Negative. No traumatic or inflammatory finding. Sinuses: Small air-fluid levels are seen within the maxillary sinuses and within the sphenoid sinus. Mild mucosal thickening within the left maxillary sinus. Soft tissues: Negative. CT CERVICAL SPINE FINDINGS Alignment: Normal. Skull base and vertebrae: No acute fracture. No primary bone lesion or focal pathologic process. Soft tissues and spinal canal: No prevertebral fluid or swelling. No visible canal hematoma. There is asymmetric nodular  enlargement of the right thyroid gland, not well characterized on this examination. Disc levels: Intervertebral disc space narrowing and endplate remodeling is seen at C4-C7, most severe at C5-6 in keeping with changes of moderate degenerative disc disease. Prevertebral soft tissues are not thickened on sagittal reformats. Spinal canal is widely patent. Uncovertebral arthrosis contributes to mild neuroforaminal narrowing on the right at C3-4 and on the left at C5-6. Upper chest: Negative. Other: None IMPRESSION: 1. No acute intracranial injury. No calvarial fracture. 2. No acute facial fracture. 3. Mild paranasal sinus disease. 4. No acute fracture or listhesis of the cervical spine. 5. Asymmetric nodular enlargement of the right thyroid gland, not well characterized on this examination. Recommend thyroid ultrasound (ref: J Am Coll Radiol. 2015 Feb;12(2): 143-50). Electronically Signed   By: Fidela Salisbury M.D.   On: 11/12/2021 19:37   CT MAXILLOFACIAL WO CONTRAST  Result Date:  11/12/2021 CLINICAL DATA:  Head trauma, moderate-severe Fall; Facial trauma, blunt, fall, EXAM: CT HEAD WITHOUT CONTRAST CT MAXILLOFACIAL WITHOUT CONTRAST CT CERVICAL SPINE WITHOUT CONTRAST TECHNIQUE: Multidetector CT imaging of the head, cervical spine, and maxillofacial structures were performed using the standard protocol without intravenous contrast. Multiplanar CT image reconstructions of the cervical spine and maxillofacial structures were also generated. RADIATION DOSE REDUCTION: This exam was performed according to the departmental dose-optimization program which includes automated exposure control, adjustment of the mA and/or kV according to patient size and/or use of iterative reconstruction technique. COMPARISON:  None Available. FINDINGS: CT HEAD FINDINGS Brain: Normal anatomic configuration. Parenchymal volume loss is commensurate with the patient's age. Mild periventricular white matter changes are present likely reflecting the sequela of small vessel ischemia. No abnormal intra or extra-axial mass lesion or fluid collection. No abnormal mass effect or midline shift. No evidence of acute intracranial hemorrhage or infarct. Ventricular size is normal. Cerebellum unremarkable. Vascular: No asymmetric hyperdense vasculature at the skull base. Skull: Intact Other: Mastoid air cells and middle ear cavities are clear. CT MAXILLOFACIAL FINDINGS Osseous: Healed fracture of the nasal bones with minimal residual deformity. No acute facial fracture. No mandibular dislocation. Orbits: Negative. No traumatic or inflammatory finding. Sinuses: Small air-fluid levels are seen within the maxillary sinuses and within the sphenoid sinus. Mild mucosal thickening within the left maxillary sinus. Soft tissues: Negative. CT CERVICAL SPINE FINDINGS Alignment: Normal. Skull base and vertebrae: No acute fracture. No primary bone lesion or focal pathologic process. Soft tissues and spinal canal: No prevertebral fluid or  swelling. No visible canal hematoma. There is asymmetric nodular enlargement of the right thyroid gland, not well characterized on this examination. Disc levels: Intervertebral disc space narrowing and endplate remodeling is seen at C4-C7, most severe at C5-6 in keeping with changes of moderate degenerative disc disease. Prevertebral soft tissues are not thickened on sagittal reformats. Spinal canal is widely patent. Uncovertebral arthrosis contributes to mild neuroforaminal narrowing on the right at C3-4 and on the left at C5-6. Upper chest: Negative. Other: None IMPRESSION: 1. No acute intracranial injury. No calvarial fracture. 2. No acute facial fracture. 3. Mild paranasal sinus disease. 4. No acute fracture or listhesis of the cervical spine. 5. Asymmetric nodular enlargement of the right thyroid gland, not well characterized on this examination. Recommend thyroid ultrasound (ref: J Am Coll Radiol. 2015 Feb;12(2): 143-50). Electronically Signed   By: Fidela Salisbury M.D.   On: 11/12/2021 19:37   CT CERVICAL SPINE WO CONTRAST  Result Date: 11/12/2021 CLINICAL DATA:  Head trauma, moderate-severe Fall; Facial trauma, blunt, fall, EXAM: CT HEAD  WITHOUT CONTRAST CT MAXILLOFACIAL WITHOUT CONTRAST CT CERVICAL SPINE WITHOUT CONTRAST TECHNIQUE: Multidetector CT imaging of the head, cervical spine, and maxillofacial structures were performed using the standard protocol without intravenous contrast. Multiplanar CT image reconstructions of the cervical spine and maxillofacial structures were also generated. RADIATION DOSE REDUCTION: This exam was performed according to the departmental dose-optimization program which includes automated exposure control, adjustment of the mA and/or kV according to patient size and/or use of iterative reconstruction technique. COMPARISON:  None Available. FINDINGS: CT HEAD FINDINGS Brain: Normal anatomic configuration. Parenchymal volume loss is commensurate with the patient's age. Mild  periventricular white matter changes are present likely reflecting the sequela of small vessel ischemia. No abnormal intra or extra-axial mass lesion or fluid collection. No abnormal mass effect or midline shift. No evidence of acute intracranial hemorrhage or infarct. Ventricular size is normal. Cerebellum unremarkable. Vascular: No asymmetric hyperdense vasculature at the skull base. Skull: Intact Other: Mastoid air cells and middle ear cavities are clear. CT MAXILLOFACIAL FINDINGS Osseous: Healed fracture of the nasal bones with minimal residual deformity. No acute facial fracture. No mandibular dislocation. Orbits: Negative. No traumatic or inflammatory finding. Sinuses: Small air-fluid levels are seen within the maxillary sinuses and within the sphenoid sinus. Mild mucosal thickening within the left maxillary sinus. Soft tissues: Negative. CT CERVICAL SPINE FINDINGS Alignment: Normal. Skull base and vertebrae: No acute fracture. No primary bone lesion or focal pathologic process. Soft tissues and spinal canal: No prevertebral fluid or swelling. No visible canal hematoma. There is asymmetric nodular enlargement of the right thyroid gland, not well characterized on this examination. Disc levels: Intervertebral disc space narrowing and endplate remodeling is seen at C4-C7, most severe at C5-6 in keeping with changes of moderate degenerative disc disease. Prevertebral soft tissues are not thickened on sagittal reformats. Spinal canal is widely patent. Uncovertebral arthrosis contributes to mild neuroforaminal narrowing on the right at C3-4 and on the left at C5-6. Upper chest: Negative. Other: None IMPRESSION: 1. No acute intracranial injury. No calvarial fracture. 2. No acute facial fracture. 3. Mild paranasal sinus disease. 4. No acute fracture or listhesis of the cervical spine. 5. Asymmetric nodular enlargement of the right thyroid gland, not well characterized on this examination. Recommend thyroid ultrasound  (ref: J Am Coll Radiol. 2015 Feb;12(2): 143-50). Electronically Signed   By: Fidela Salisbury M.D.   On: 11/12/2021 19:37   DG Chest Portable 1 View  Result Date: 11/12/2021 CLINICAL DATA:  Weakness EXAM: PORTABLE CHEST 1 VIEW COMPARISON:  Chest 08/25/2021 FINDINGS: Heart size and vascularity normal. Mild atherosclerotic calcification aortic arch. Mild right lower lobe airspace disease most likely atelectasis. Left lung clear. No effusion Chronic right rib fractures with prominent callus formation. IMPRESSION: Mild right lower lobe atelectasis. Electronically Signed   By: Franchot Gallo M.D.   On: 11/12/2021 14:06   CT HEAD WO CONTRAST  Result Date: 11/12/2021 CLINICAL DATA:  Persistent dizziness EXAM: CT HEAD WITHOUT CONTRAST TECHNIQUE: Contiguous axial images were obtained from the base of the skull through the vertex without intravenous contrast. RADIATION DOSE REDUCTION: This exam was performed according to the departmental dose-optimization program which includes automated exposure control, adjustment of the mA and/or kV according to patient size and/or use of iterative reconstruction technique. COMPARISON:  08/23/2021, 01/21/2019 FINDINGS: Brain: Stable atrophy pattern and white matter microvascular ischemic changes throughout both cerebral hemispheres. No acute intracranial hemorrhage, new infarction, mass lesion, midline shift, herniation, hydrocephalus, or extra-axial fluid collection. No focal mass effect or edema. Cisterns are patent.  No gross cerebellar abnormality. Vascular: No hyperdense vessel or unexpected calcification. Skull: Normal. Negative for fracture or focal lesion. Sinuses/Orbits: Orbits are symmetric. Small bilateral maxillary sinus and sphenoid air-fluid levels can be seen with sinusitis. Mastoids are clear. Other: None. IMPRESSION: 1. Stable atrophy pattern and white matter microvascular ischemic changes. 2. No acute intracranial abnormality by noncontrast CT. 3. Small bilateral  maxillary and sphenoid air-fluid levels can be seen with sinusitis. Electronically Signed   By: Jerilynn Mages.  Shick M.D.   On: 11/12/2021 12:12    Procedures .Marland KitchenLaceration Repair  Date/Time: 11/12/2021 8:58 PM  Performed by: Duanne Guess, PA-C Authorized by: Duanne Guess, PA-C   Consent:    Consent obtained:  Verbal   Consent given by:  Patient Universal protocol:    Patient identity confirmed:  Verbally with patient Anesthesia:    Anesthesia method:  Local infiltration   Local anesthetic:  Lidocaine 1% WITH epi Laceration details:    Location:  Scalp   Scalp location:  Frontal   Length (cm):  4   Depth (mm):  3 Pre-procedure details:    Preparation:  Patient was prepped and draped in usual sterile fashion Treatment:    Area cleansed with:  Povidone-iodine and saline   Amount of cleaning:  Standard   Irrigation method:  Tap and syringe Skin repair:    Repair method:  Staples   Number of staples:  6 Approximation:    Approximation:  Close Repair type:    Repair type:  Simple Post-procedure details:    Dressing:  Bulky dressing     Medications Ordered in ED Medications  lidocaine-EPINEPHrine (XYLOCAINE W/EPI) 1 %-1:100000 (with pres) injection 30 mL (30 mLs Infiltration Given by Other 11/12/21 2032)    ED Course/ Medical Decision Making/ A&P                           Medical Decision Making Amount and/or Complexity of Data Reviewed Radiology: ordered.  Risk Prescription drug management.   71 year old female with fall after tripping on her walker earlier today.  Tetanus is up-to-date.  No headache, complaints of bony pain or joint pain.  CT of the head, maxillofacial and cervical spine negative.  Laceration thoroughly irrigated cleansed and repaired with staples.  She is educated on wound care, follow-up for staple removal and signs symptoms return to the ER for. Final Clinical Impression(s) / ED Diagnoses Final diagnoses:  Injury of head, initial encounter   Laceration of scalp without foreign body, initial encounter    Rx / DC Orders ED Discharge Orders     None         Renata Caprice 11/12/21 2103    Harvest Dark, MD 11/12/21 2219

## 2021-11-12 NOTE — ED Provider Notes (Signed)
[facility] Provider Note    Event Date/Time   First MD Initiated Contact with Patient 11/12/21 1209     (approximate)   History   Weakness   HPI  Brittney Ray is a 71 y.o. female presents to the ER for of "head feels weird.  ".  Patient will not provide any additional descriptors of her symptoms.  Symptoms started this morning.  Denies any numbness or tingling.  No headache.  No chest pain or shortness of breath no nausea or vomiting.     Physical Exam   Triage Vital Signs: ED Triage Vitals [11/12/21 1150]  Enc Vitals Group     BP 132/64     Pulse Rate 80     Resp 17     Temp 98.4 F (36.9 C)     Temp Source Oral     SpO2 100 %     Weight      Height      Head Circumference      Peak Flow      Pain Score 0     Pain Loc      Pain Edu?      Excl. in [place]?     Most recent vital signs: Vitals:   11/12/21 1150 11/12/21 1430  BP: 132/64 (!) 146/60  Pulse: 80 70  Resp: 17 16  Temp: 98.4 F (36.9 C)   SpO2: 100% 100%     Constitutional: Alert, anxious appearing Eyes: Conjunctivae are normal.  Head: Atraumatic. Nose: No congestion/rhinnorhea. Mouth/Throat: Mucous membranes are moist.   Neck: Painless ROM.  Cardiovascular:   Good peripheral circulation. Respiratory: Normal respiratory effort.  No retractions.  Gastrointestinal: Soft and nontender.  Musculoskeletal:  no deformity Neurologic:  MAE spontaneously. No gross focal neurologic deficits are appreciated.  Skin:  Skin is warm, dry and intact. No rash noted. Psychiatric: Mood and affect are anxious Speech and behavior are normal.    ED Results / Procedures / Treatments   Labs (all labs ordered are listed, but only abnormal results are displayed) Labs Reviewed  BASIC METABOLIC PANEL - Abnormal; Notable for the following components:      Result Value   Glucose, Bld 118 (*)    BUN 35 (*)    Creatinine, Ser 1.13 (*)    GFR, Estimated 52 (*)    All other components  within normal limits  URINALYSIS, ROUTINE W REFLEX MICROSCOPIC - Abnormal; Notable for the following components:   Color, Urine YELLOW (*)    APPearance CLOUDY (*)    Leukocytes,Ua TRACE (*)    Bacteria, UA RARE (*)    All other components within normal limits  CBC  CBG MONITORING, ED  TROPONIN I (HIGH SENSITIVITY)  TROPONIN I (HIGH SENSITIVITY)     EKG  ED ECG REPORT I, Merlyn Lot, the attending physician, personally viewed and interpreted this ECG.   Date: 11/12/2021  EKG Time: 11:59  Rate: 80  Rhythm: sinus  Axis: normal  Intervals: normal  ST&T Change: no stemi, no depression    RADIOLOGY Please see ED Course for my review and interpretation.  I personally reviewed all radiographic images ordered to evaluate for the above acute complaints and reviewed radiology reports and findings.  These findings were personally discussed with the patient.  Please see medical record for radiology report.    PROCEDURES:  Critical Care performed: No  Procedures   MEDICATIONS ORDERED IN ED: Medications  diazepam (VALIUM) tablet 2 mg (2  mg Oral Given 11/12/21 1236)     IMPRESSION / MDM / ASSESSMENT AND PLAN / ED COURSE  I reviewed the triage vital signs and the nursing notes.                              Differential diagnosis includes, but is not limited to, Dehydration, sepsis, pna, uti, hypoglycemia, cva, drug effect, withdrawal, encephalitis  Patient presenting to the ER for evaluation of symptoms as described above.  Based on symptoms, risk factors and considered above differential, this presenting complaint could reflect a potentially life-threatening illness therefore the patient will be placed on continuous pulse oximetry and telemetry for monitoring.  Laboratory evaluation will be sent to evaluate for the above complaints.      Clinical Course as of 11/12/21 1511  Wed Nov 12, 2021  1225 CT head on my review and interpretation does not show any evidence of  mass or subdural hematoma. [PR]  1226 Patient does seem very anxious simply repeating that her "head feels weird "but is unable to give any additional descriptors.  Feeling over bedside states that she has been stressed does have a history of anxiety.  Will trial low-dose Valium to see if this might cause some symptomatic relief and she has no other specific complaints or symptoms. [PR]  1503 Patient reassessed.  Had improvement of symptoms after anxiolysis.  Work-up here is reassuring.  Patient family feels comfortable with patient going home for outpatient follow-up.  Discussed return precautions. [PR]    Clinical Course User Index [PR] Merlyn Lot, MD      FINAL CLINICAL IMPRESSION(S) / ED DIAGNOSES   Final diagnoses:  Confusion     Rx / DC Orders   ED Discharge Orders          Ordered    escitalopram (LEXAPRO) 10 MG tablet  Daily        11/12/21 1501             Note:  This document was prepared using Dragon voice recognition software and may include unintentional dictation errors.    Merlyn Lot, MD 11/12/21 [phone]

## 2021-11-12 NOTE — ED Triage Notes (Signed)
Pt presents to ED with c/o of her "head not feeling right". Pt denies headache. Pt denies any recent falls where she hit her head. Sister states is being evaluated for Parkinson's but no new meds given. Pt states some dizziness. Pt is A&Ox4 at this time. NAD noted in triage

## 2022-02-18 ENCOUNTER — Other Ambulatory Visit: Payer: Self-pay | Admitting: Student

## 2022-02-18 DIAGNOSIS — R2689 Other abnormalities of gait and mobility: Secondary | ICD-10-CM

## 2022-02-22 ENCOUNTER — Ambulatory Visit
Admission: RE | Admit: 2022-02-22 | Discharge: 2022-02-22 | Disposition: A | Discharge status: Home / Self Care | Payer: Medicare Other | Source: Ambulatory Visit | Attending: Student | Admitting: Student

## 2022-02-22 DIAGNOSIS — R2689 Other abnormalities of gait and mobility: Secondary | ICD-10-CM | POA: Insufficient documentation
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
## 2022-03-17 ENCOUNTER — Encounter: Payer: Self-pay | Admitting: Neurosurgery

## 2022-03-17 ENCOUNTER — Ambulatory Visit: Payer: Medicare Other | Admitting: Neurosurgery

## 2022-03-17 VITALS — BP 110/63 | HR 74 | Ht 60.0 in | Wt 143.0 lb

## 2022-03-17 DIAGNOSIS — M542 Cervicalgia: Secondary | ICD-10-CM

## 2022-03-17 DIAGNOSIS — F0390 Unspecified dementia without behavioral disturbance: Secondary | ICD-10-CM | POA: Diagnosis not present
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
## 2022-05-28 ENCOUNTER — Other Ambulatory Visit: Payer: Medicare Other

## 2022-05-28 DIAGNOSIS — Z515 Encounter for palliative care: Secondary | ICD-10-CM

## 2022-05-28 NOTE — Progress Notes (Signed)
TELEPHONE ENCOUNTER  Palliative care SW connected with patients spouse to discuss and review new PC referral.   Spouse endorses that has severe cognitive impairment ad he is is concerned about her declining further and being unable to care for her. Patient has not been dx with dementia, as far as husband knows.  Patient is Uses a walker at home for stability and still unstable. Patient is incontinent of B/B currently. Requires assistance with bathing and dressing at times. Spouse shares they have a caregiver that comes once a week to assist with bath. Patient was on [facility] PT sometime ago but spouse states he does not believe that is what patient needed. Spouse is interested in additional caregiver assistance/support.   PC to complete in home consultation 5/28 @noon  to review new PC criteria and determine eligibility.

## 2022-06-09 ENCOUNTER — Other Ambulatory Visit: Payer: Medicare Other

## 2022-06-09 VITALS — BP 132/64 | HR 63 | Temp 97.2°F

## 2022-06-09 DIAGNOSIS — Z515 Encounter for palliative care: Secondary | ICD-10-CM
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
## 2022-06-09 NOTE — Progress Notes (Signed)
PATIENT NAME: Brittney Ray DOB: Dec 02, 1950 MRN: [id number]  PRIMARY CARE PROVIDER: Marisue Ivan, MD  RESPONSIBLE PARTY:  Acct ID - Guarantor Home Phone Work Phone Relationship Acct Type  [id number] [name], [name] [phone]  Self P/F     [address], [place], [place] [address]   Home visit completed with patient, spouse and her sister.   ACP:  Patient and spouse share hardships of caring for patient now.  Sisters and spouse are assisting with ADL's.  Goal is to remain home for as long as possible.  DNR already in place.   Abnormal Weight Loss:  Was previously over 200 lbs about 2 years ago.  Now at 138 lbs.   Spouse is now doing meal prep as patient is no longer able to do this.  She is able to feed herself but spouse states this has become harder.    Anxiety:  Currently on buspar and lexapro.  Patient feels her anxiety is mostly related to her fear of falling.  Does not feel her anxiety is well managed at this time.  She will follow up with neurology on Thursday.   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  She reports stiffness and rigid muscles.  Now using a rolling walker.  Having frequent falls.  No injuries thus far.   Patient is able to stand from bed and chair with standby assistance.  She requires assistance with dressing, grooming and bathing.  Patient is incontinent of bowel and bladder.  Spouse states patient has starring spells and takes time to process and and speak.  Able to engage in conversation but has difficulty finding words.   DME:  Spouse is asking for a hospital bed.  Will follow up with neurology with this request.   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  Feels this makes her balance worse.   Palliative Care:  Services explained to patient and spouse.  Consents signed.   Pain:  Denies any issue with pain.   Urinary Infection:  History of urinary tract infection.  Patient frequently going to the bathroom to  urinate on this visit.   Denies pain with urination.  Spouse will follow up with PCP regarding urine testing.   Follow up visit scheduled on 6/27 @ 1045 am.   CODE STATUS: DNR ADVANCED DIRECTIVES: No MOST FORM: No PPS: 50%-weak   PHYSICAL EXAM:   VITALS: Today's Vitals   06/09/22 1209  BP: 132/64  Pulse: 63  Temp: (!) 97.2 F (36.2 C)  SpO2: 99%    LUNGS: clear to auscultation  CARDIAC: Cor RRR}  EXTREMITIES: - for edema SKIN: Skin color, texture, turgor normal. No rashes or lesions or mobility and turgor normal  NEURO: positive for gait problems       Truitt Merle, RN

## 2022-07-09 ENCOUNTER — Other Ambulatory Visit: Payer: Medicare Other

## 2022-08-17 ENCOUNTER — Encounter: Payer: Self-pay | Admitting: *Deleted

## 2022-08-17 ENCOUNTER — Emergency Department: Payer: Medicare Other

## 2022-08-17 ENCOUNTER — Emergency Department
Admission: EM | Admit: 2022-08-17 | Discharge: 2022-08-17 | Disposition: A | Discharge status: Home / Self Care | Payer: Medicare Other | Attending: Emergency Medicine | Admitting: Emergency Medicine

## 2022-08-17 ENCOUNTER — Other Ambulatory Visit: Payer: Self-pay

## 2022-08-17 DIAGNOSIS — R55 Syncope and collapse: Secondary | ICD-10-CM

## 2022-08-17 DIAGNOSIS — R638 Other symptoms and signs concerning food and fluid intake: Secondary | ICD-10-CM | POA: Insufficient documentation

## 2022-08-17 LAB — CBC
HCT: 30.6 % — ABNORMAL LOW (ref 36.0–46.0)
Hemoglobin: 10.3 g/dL — ABNORMAL LOW (ref 12.0–15.0)
MCH: 29.7 pg (ref 26.0–34.0)
MCHC: 33.7 g/dL (ref 30.0–36.0)
MCV: 88.2 fL (ref 80.0–100.0)
Platelets: 137 10*3/uL — ABNORMAL LOW (ref 150–400)
RBC: 3.47 MIL/uL — ABNORMAL LOW (ref 3.87–5.11)
RDW: 14.8 % (ref 11.5–15.5)
WBC: 3.6 10*3/uL — ABNORMAL LOW (ref 4.0–10.5)
nRBC: 0 % (ref 0.0–0.2)

## 2022-08-17 LAB — TROPONIN I (HIGH SENSITIVITY)
Troponin I (High Sensitivity): 7 ng/L (ref <18)
Troponin I (High Sensitivity): 6 ng/L (ref <18)

## 2022-08-17 LAB — BASIC METABOLIC PANEL
Anion gap: 9 (ref 5–15)
BUN: 33 mg/dL — ABNORMAL HIGH (ref 8–23)
CO2: 23 mmol/L (ref 22–32)
Calcium: 8.5 mg/dL — ABNORMAL LOW (ref 8.9–10.3)
Chloride: 104 mmol/L (ref 98–111)
Creatinine, Ser: 1.3 mg/dL — ABNORMAL HIGH (ref 0.44–1.00)
GFR, Estimated: 44 mL/min — ABNORMAL LOW (ref >60)
Glucose, Bld: 102 mg/dL — ABNORMAL HIGH (ref 70–99)
Potassium: 3.8 mmol/L (ref 3.5–5.1)
Sodium: 136 mmol/L (ref 135–145)

## 2022-08-17 NOTE — ED Notes (Signed)
Called [facility] to give report of discharge. No answer

## 2022-08-17 NOTE — ED Provider Notes (Signed)
[facility] Provider Note   Event Date/Time   First MD Initiated Contact with Patient 08/17/22 2321     (approximate) History  Near Syncope  HPI Brittney Ray is a 72 y.o. female who presents from her long-term care facility after an episode of syncope upon standing today.  Patient states that she fell back into a wall but denies any significant head trauma and denies any headache at this time.  Patient does endorse decreased p.o. intake over the last 24 hours.  Caregiver at bedside also states the patient has had a persistent cough for the last 2 days without any complaints of chills, fevers, sore throat, recent travel, or sick contacts ROS: Patient currently denies any vision changes, tinnitus, difficulty speaking, facial droop, sore throat, chest pain, shortness of breath, abdominal pain, nausea/vomiting/diarrhea, dysuria, or weakness/numbness/paresthesias in any extremity   Physical Exam  Triage Vital Signs: ED Triage Vitals  Encounter Vitals Group     BP 08/17/22 1600 (!) 97/54     Systolic BP Percentile --      Diastolic BP Percentile --      Pulse Rate 08/17/22 1600 72     Resp 08/17/22 1600 16     Temp 08/17/22 1600 98.3 F (36.8 C)     Temp Source 08/17/22 1600 Oral     SpO2 08/17/22 1600 100 %     Weight 08/17/22 1600 141 lb (64 kg)     Height 08/17/22 1600 5' (1.524 m)     Head Circumference --      Peak Flow --      Pain Score 08/17/22 1605 0     Pain Loc --      Pain Education --      Exclude from Growth Chart --    Most recent vital signs: Vitals:   08/17/22 1600 08/17/22 2134  BP: (!) 97/54 138/78  Pulse: 72 68  Resp: 16 16  Temp: 98.3 F (36.8 C) 98.3 F (36.8 C)  SpO2: 100% 98%   General: Awake, oriented x4. CV:  Good peripheral perfusion.  Resp:  Normal effort.  Abd:  No distention.  Other:  Well-developed, well-nourished Caucasian female laying in bed in no acute distress. ED Results / Procedures / Treatments   Labs (all labs ordered are listed, but only abnormal results are displayed) Labs Reviewed  BASIC METABOLIC PANEL - Abnormal; Notable for the following components:      Result Value   Glucose, Bld 102 (*)    BUN 33 (*)    Creatinine, Ser 1.30 (*)    Calcium 8.5 (*)    GFR, Estimated 44 (*)    All other components within normal limits  CBC - Abnormal; Notable for the following components:   WBC 3.6 (*)    RBC 3.47 (*)    Hemoglobin 10.3 (*)    HCT 30.6 (*)    Platelets 137 (*)    All other components within normal limits  TROPONIN I (HIGH SENSITIVITY)  TROPONIN I (HIGH SENSITIVITY)   EKG ED ECG REPORT I, Merwyn Katos, the attending physician, personally viewed and interpreted this ECG. Date: 08/17/2022 EKG Time: 1603 Rate: 70 Rhythm: normal sinus rhythm QRS Axis: normal Intervals: normal ST/T Wave abnormalities: normal Narrative Interpretation: no evidence of acute ischemia RADIOLOGY ED MD interpretation: 2 view chest x-ray interpreted by me shows no evidence of acute abnormalities including no pneumonia, pneumothorax, or widened mediastinum -Agree with radiology assessment Official radiology report(s): DG  Chest 2 View  Result Date: 08/17/2022 CLINICAL DATA:  Syncope EXAM: CHEST - 2 VIEW COMPARISON:  X-ray 11/12/2021 FINDINGS: Multiple old bilateral rib fractures again identified. No consolidation, pneumothorax or effusion. No edema. Normal cardiopericardial silhouette. Calcified aorta. IMPRESSION: No acute cardiopulmonary disease. Electronically Signed   By: Karen Kays M.D.   On: 08/17/2022 16:33   PROCEDURES: Critical Care performed: No .1-3 Lead EKG Interpretation  Performed by: Merwyn Katos, MD Authorized by: Merwyn Katos, MD     Interpretation: normal     ECG rate:  71   ECG rate assessment: normal     Rhythm: sinus rhythm     Ectopy: none     Conduction: normal    MEDICATIONS ORDERED IN ED: Medications - No data to display IMPRESSION / MDM /  ASSESSMENT AND PLAN / ED COURSE  I reviewed the triage vital signs and the nursing notes.                             The patient is on the cardiac monitor to evaluate for evidence of arrhythmia and/or significant heart rate changes. Patient's presentation is most consistent with acute presentation with potential threat to life or bodily function. Patient presents with complaints of syncope/presyncope ED Workup:  CBC, BMP, Troponin, BNP, ECG, CXR Differential diagnosis includes HF, ICH, seizure, stroke, HOCM, ACS, aortic dissection, malignant arrhythmia, or GI bleed. Findings: No evidence of acute laboratory abnormalities.  Troponin negative x1 EKG: No e/o STEMI. No evidence of Brugadas sign, delta wave, epsilon wave, significantly prolonged QTc, or malignant arrhythmia.  Disposition: Discharge. Patient is at baseline at this time. Return precautions expressed and understood in person. Advised follow up with primary care provider or clinic physician in next 24 hours.   FINAL CLINICAL IMPRESSION(S) / ED DIAGNOSES   Final diagnoses:  Syncope and collapse   Rx / DC Orders   ED Discharge Orders     None      Note:  This document was prepared using Dragon voice recognition software and may include unintentional dictation errors.   Merwyn Katos, MD 08/17/22 [id number]

## 2022-08-17 NOTE — ED Triage Notes (Addendum)
Pt brought in via ems from [facility].  Pt reportedly had a syncopal episode today striking the wall.  Pt reports she fell out of bed.  No headache.  No chest pain or sob.   No loc  no n/v/   pt alert  iv in place.  Pt has no pants on in triage.  Sheet covering pt.

## 2022-08-17 NOTE — ED Notes (Signed)
Pt assisted to bathroom to have bowel movement, new diaper given.

## 2022-11-01 ENCOUNTER — Inpatient Hospital Stay
Admission: EM | Admit: 2022-11-01 | Discharge: 2022-11-05 | DRG: 690 | Disposition: A | Discharge status: Home / Self Care | Payer: Medicare Other | Attending: Internal Medicine | Admitting: Internal Medicine

## 2022-11-01 ENCOUNTER — Emergency Department: Payer: Medicare Other

## 2022-11-01 ENCOUNTER — Other Ambulatory Visit: Payer: Self-pay

## 2022-11-01 DIAGNOSIS — I10 Essential (primary) hypertension: Secondary | ICD-10-CM | POA: Diagnosis present

## 2022-11-01 DIAGNOSIS — I129 Hypertensive chronic kidney disease with stage 1 through stage 4 chronic kidney disease, or unspecified chronic kidney disease: Secondary | ICD-10-CM | POA: Diagnosis present

## 2022-11-01 DIAGNOSIS — Z7982 Long term (current) use of aspirin: Secondary | ICD-10-CM

## 2022-11-01 DIAGNOSIS — W19XXXA Unspecified fall, initial encounter: Secondary | ICD-10-CM | POA: Diagnosis present

## 2022-11-01 DIAGNOSIS — E039 Hypothyroidism, unspecified: Secondary | ICD-10-CM | POA: Diagnosis present

## 2022-11-01 DIAGNOSIS — E1129 Type 2 diabetes mellitus with other diabetic kidney complication: Secondary | ICD-10-CM | POA: Diagnosis present

## 2022-11-01 DIAGNOSIS — M889 Osteitis deformans of unspecified bone: Secondary | ICD-10-CM | POA: Diagnosis present

## 2022-11-01 DIAGNOSIS — F32A Depression, unspecified: Secondary | ICD-10-CM | POA: Diagnosis present

## 2022-11-01 DIAGNOSIS — Z8673 Personal history of transient ischemic attack (TIA), and cerebral infarction without residual deficits: Secondary | ICD-10-CM | POA: Diagnosis not present

## 2022-11-01 DIAGNOSIS — F418 Other specified anxiety disorders: Secondary | ICD-10-CM | POA: Diagnosis present

## 2022-11-01 DIAGNOSIS — F410 Panic disorder [episodic paroxysmal anxiety] without agoraphobia: Secondary | ICD-10-CM | POA: Diagnosis present

## 2022-11-01 DIAGNOSIS — K219 Gastro-esophageal reflux disease without esophagitis: Secondary | ICD-10-CM | POA: Diagnosis present

## 2022-11-01 DIAGNOSIS — I2510 Atherosclerotic heart disease of native coronary artery without angina pectoris: Secondary | ICD-10-CM | POA: Diagnosis present

## 2022-11-01 DIAGNOSIS — N12 Tubulo-interstitial nephritis, not specified as acute or chronic: Principal | ICD-10-CM

## 2022-11-01 DIAGNOSIS — Z90710 Acquired absence of both cervix and uterus: Secondary | ICD-10-CM

## 2022-11-01 DIAGNOSIS — R296 Repeated falls: Secondary | ICD-10-CM | POA: Diagnosis present

## 2022-11-01 DIAGNOSIS — Z66 Do not resuscitate: Secondary | ICD-10-CM | POA: Diagnosis present

## 2022-11-01 DIAGNOSIS — N10 Acute tubulo-interstitial nephritis: Secondary | ICD-10-CM | POA: Diagnosis present

## 2022-11-01 DIAGNOSIS — E782 Mixed hyperlipidemia: Secondary | ICD-10-CM | POA: Diagnosis present

## 2022-11-01 DIAGNOSIS — I700 Atherosclerosis of aorta: Secondary | ICD-10-CM | POA: Diagnosis present

## 2022-11-01 DIAGNOSIS — G231 Progressive supranuclear ophthalmoplegia [Steele-Richardson-Olszewski]: Secondary | ICD-10-CM | POA: Diagnosis present

## 2022-11-01 DIAGNOSIS — G459 Transient cerebral ischemic attack, unspecified: Secondary | ICD-10-CM | POA: Diagnosis not present

## 2022-11-01 DIAGNOSIS — B9620 Unspecified Escherichia coli [E. coli] as the cause of diseases classified elsewhere: Secondary | ICD-10-CM | POA: Diagnosis present

## 2022-11-01 DIAGNOSIS — Y92009 Unspecified place in unspecified non-institutional (private) residence as the place of occurrence of the external cause: Secondary | ICD-10-CM | POA: Diagnosis not present

## 2022-11-01 DIAGNOSIS — F419 Anxiety disorder, unspecified: Secondary | ICD-10-CM

## 2022-11-01 DIAGNOSIS — K5730 Diverticulosis of large intestine without perforation or abscess without bleeding: Secondary | ICD-10-CM | POA: Diagnosis present

## 2022-11-01 DIAGNOSIS — T148XXA Other injury of unspecified body region, initial encounter: Secondary | ICD-10-CM | POA: Diagnosis not present

## 2022-11-01 DIAGNOSIS — Z7984 Long term (current) use of oral hypoglycemic drugs: Secondary | ICD-10-CM | POA: Diagnosis not present

## 2022-11-01 DIAGNOSIS — Z8669 Personal history of other diseases of the nervous system and sense organs: Secondary | ICD-10-CM | POA: Diagnosis not present

## 2022-11-01 DIAGNOSIS — K449 Diaphragmatic hernia without obstruction or gangrene: Secondary | ICD-10-CM | POA: Diagnosis present

## 2022-11-01 DIAGNOSIS — E1122 Type 2 diabetes mellitus with diabetic chronic kidney disease: Secondary | ICD-10-CM | POA: Diagnosis present

## 2022-11-01 DIAGNOSIS — S20223A Contusion of bilateral back wall of thorax, initial encounter: Secondary | ICD-10-CM | POA: Diagnosis present

## 2022-11-01 DIAGNOSIS — M4800 Spinal stenosis, site unspecified: Secondary | ICD-10-CM | POA: Diagnosis present

## 2022-11-01 DIAGNOSIS — N1831 Chronic kidney disease, stage 3a: Secondary | ICD-10-CM | POA: Diagnosis present

## 2022-11-01 DIAGNOSIS — Z923 Personal history of irradiation: Secondary | ICD-10-CM

## 2022-11-01 DIAGNOSIS — Z79890 Hormone replacement therapy (postmenopausal): Secondary | ICD-10-CM

## 2022-11-01 DIAGNOSIS — Z803 Family history of malignant neoplasm of breast: Secondary | ICD-10-CM

## 2022-11-01 DIAGNOSIS — Z853 Personal history of malignant neoplasm of breast: Secondary | ICD-10-CM

## 2022-11-01 DIAGNOSIS — M170 Bilateral primary osteoarthritis of knee: Secondary | ICD-10-CM | POA: Diagnosis present

## 2022-11-01 DIAGNOSIS — Z9049 Acquired absence of other specified parts of digestive tract: Secondary | ICD-10-CM

## 2022-11-01 DIAGNOSIS — Z79899 Other long term (current) drug therapy: Secondary | ICD-10-CM

## 2022-11-01 LAB — HEPATIC FUNCTION PANEL
ALT: 15 U/L (ref 0–44)
AST: 20 U/L (ref 15–41)
Albumin: 3.5 g/dL (ref 3.5–5.0)
Alkaline Phosphatase: 27 U/L — ABNORMAL LOW (ref 38–126)
Bilirubin, Direct: <0.1 mg/dL (ref 0.0–0.2)
Total Bilirubin: 0.4 mg/dL (ref 0.3–1.2)
Total Protein: 6.6 g/dL (ref 6.5–8.1)

## 2022-11-01 LAB — CBC
HCT: 34.4 % — ABNORMAL LOW (ref 36.0–46.0)
Hemoglobin: 11 g/dL — ABNORMAL LOW (ref 12.0–15.0)
MCH: 29.1 pg (ref 26.0–34.0)
MCHC: 32 g/dL (ref 30.0–36.0)
MCV: 91 fL (ref 80.0–100.0)
Platelets: 211 10*3/uL (ref 150–400)
RBC: 3.78 MIL/uL — ABNORMAL LOW (ref 3.87–5.11)
RDW: 14.1 % (ref 11.5–15.5)
WBC: 5.1 10*3/uL (ref 4.0–10.5)
nRBC: 0 % (ref 0.0–0.2)

## 2022-11-01 LAB — URINALYSIS, ROUTINE W REFLEX MICROSCOPIC
Bilirubin Urine: NEGATIVE
Glucose, UA: NEGATIVE mg/dL
Hgb urine dipstick: NEGATIVE
Ketones, ur: NEGATIVE mg/dL
Nitrite: POSITIVE — AB
Protein, ur: NEGATIVE mg/dL
Specific Gravity, Urine: 1.021 (ref 1.005–1.030)
WBC, UA: >50 WBC/hpf (ref 0–5)
pH: 5 (ref 5.0–8.0)

## 2022-11-01 LAB — BASIC METABOLIC PANEL
Anion gap: 8 (ref 5–15)
BUN: 37 mg/dL — ABNORMAL HIGH (ref 8–23)
CO2: 23 mmol/L (ref 22–32)
Calcium: 9.2 mg/dL (ref 8.9–10.3)
Chloride: 107 mmol/L (ref 98–111)
Creatinine, Ser: 1.15 mg/dL — ABNORMAL HIGH (ref 0.44–1.00)
GFR, Estimated: 50 mL/min — ABNORMAL LOW (ref >60)
Glucose, Bld: 154 mg/dL — ABNORMAL HIGH (ref 70–99)
Potassium: 3.9 mmol/L (ref 3.5–5.1)
Sodium: 138 mmol/L (ref 135–145)

## 2022-11-01 LAB — LIPASE, BLOOD: Lipase: 52 U/L — ABNORMAL HIGH (ref 11–51)

## 2022-11-01 LAB — CBG MONITORING, ED: Glucose-Capillary: 86 mg/dL (ref 70–99)

## 2022-11-01 MED ORDER — INSULIN ASPART 100 UNIT/ML IJ SOLN
0.0000 [IU] | Freq: Three times a day (TID) | INTRAMUSCULAR | Status: DC
  Administered 2022-11-03: 1 [IU] via SUBCUTANEOUS
  Filled 2022-11-01: qty 1

## 2022-11-01 MED ORDER — SODIUM CHLORIDE 0.9 % IV SOLN
1.0000 g | Freq: Once | INTRAVENOUS | Status: AC
  Administered 2022-11-01: 1 g via INTRAVENOUS
  Filled 2022-11-01: qty 10

## 2022-11-01 MED ORDER — INSULIN ASPART 100 UNIT/ML IJ SOLN
0.0000 [IU] | Freq: Every day | INTRAMUSCULAR | Status: DC
  Filled 2022-11-01: qty 1

## 2022-11-01 MED ORDER — ACETAMINOPHEN 325 MG PO TABS: 650.0000 mg | ORAL_TABLET | Freq: Four times a day (QID) | ORAL | Status: DC | PRN

## 2022-11-01 MED ORDER — OXYCODONE-ACETAMINOPHEN 5-325 MG PO TABS
1.0000 | ORAL_TABLET | ORAL | Status: DC | PRN
  Administered 2022-11-02 – 2022-11-04 (×8): 1 via ORAL
  Filled 2022-11-01 (×9): qty 1

## 2022-11-01 MED ORDER — SODIUM CHLORIDE 0.9 % IV SOLN
1.0000 g | INTRAVENOUS | Status: DC
Start: 2022-11-02 — End: 2022-11-04
  Administered 2022-11-02 – 2022-11-03 (×2): 1 g via INTRAVENOUS
  Filled 2022-11-01 (×3): qty 10

## 2022-11-01 MED ORDER — FENTANYL CITRATE PF 50 MCG/ML IJ SOSY
12.5000 ug | PREFILLED_SYRINGE | INTRAMUSCULAR | Status: DC | PRN
  Administered 2022-11-05: 12.5 ug via INTRAVENOUS
  Filled 2022-11-01: qty 1

## 2022-11-01 MED ORDER — ONDANSETRON HCL 4 MG/2ML IJ SOLN: 4.0000 mg | Freq: Three times a day (TID) | INTRAMUSCULAR | Status: DC | PRN

## 2022-11-01 MED ORDER — HYDRALAZINE HCL 20 MG/ML IJ SOLN: 5.0000 mg | INTRAMUSCULAR | Status: DC | PRN

## 2022-11-01 MED ORDER — MORPHINE SULFATE (PF) 4 MG/ML IV SOLN
4.0000 mg | Freq: Once | INTRAVENOUS | Status: AC
  Administered 2022-11-01: 4 mg via INTRAVENOUS
  Filled 2022-11-01: qty 1

## 2022-11-01 MED ORDER — LIDOCAINE 5 % EX PTCH
1.0000 | MEDICATED_PATCH | CUTANEOUS | Status: DC
  Administered 2022-11-01 – 2022-11-04 (×4): 1 via TRANSDERMAL
  Filled 2022-11-01 (×4): qty 1

## 2022-11-01 MED ORDER — MORPHINE SULFATE (PF) 2 MG/ML IV SOLN
2.0000 mg | Freq: Once | INTRAVENOUS | Status: AC
  Administered 2022-11-01: 2 mg via INTRAVENOUS
  Filled 2022-11-01: qty 1

## 2022-11-01 NOTE — H&P (Signed)
History and Physical    Brittney Ray MVH:[id number] DOB: [birth year] DOA: 11/01/2022  Referring MD/NP/PA:   PCP: Marisue Ivan, MD   Patient coming from:  The patient is coming from home.     Chief Complaint:  lower back/right flank pain, increased urinary frequency  HPI: Brittney Ray is a 72 y.o. female with medical history significant of progressive supranuclear palsy, hypertension, hyperlipidemia, diabetes mellitus, CAD, TIA, hypothyroidism, depression with anxiety, CKD-3A, Paget bone disease, breast cancer (s/p of right lumpectomy, radiation therapy), who presents with lower back/flank pain, increased urinary frequency.  Patient states that her lower back/flank pain started today, which is constant, aching, severe, sharp, nonradiating, aggravated by movement.  She has increased urinary frequency, denies dysuria or burning with urination.  No fever or chills.  Denies nausea, vomiting, diarrhea or abdominal pain.  Denies chest pain, cough, SOB.  She states that she fell about 10 days ago, no loss of consciousness.  She strongly denies any head or neck injury.  She refused CT scan of head and neck.  Data reviewed independently and ED Course: pt was found to have WBC 5.1, positive urinalysis (cloudy appearance, large amount of leukocyte, positive nitrite, many bacteria, WBC> 50), stable renal function, temperature normal, blood pressure 158/63, heart rate 71, RR 18, oxygen saturation 99% on room air.  CT scan per renal stone protocol: 1. No renal stone or hydronephrosis. 2. There are two adjacent well-circumscribed fluid collections within the subcutaneous soft tissues of the right lower back measuring 8.0 x 2.8 x 7.5 cm and 4.0 x 2.1 x 3.2 cm. There is surrounding fat stranding within the adjacent subcutaneous fat. These could represent liquefied hematomas or abscesses. 3. Colonic diverticulosis without evidence of acute diverticulitis. 4. Aortic atherosclerosis  (ICD10-I70.0).  CT of lumbar spine: 1. No acute fracture or traumatic listhesis of the lumbar spine. 2. Chronic pagetoid changes of the L2 vertebral body and posterior elements, unchanged in appearance compared to multiple previous studies. 3. High-grade foraminal stenosis bilaterally at L5-S1 and on the left at L4-L5. 4. Partially imaged fluid collection in the subcutaneous soft tissues of the right low back, better assessed on concurrently obtained CT abdomen pelvis. 5. Aortic atherosclerosis (ICD10-I70.0).   EKG: Not done in ED, will get one.     Review of Systems:   General: no fevers, chills, no body weight gain, has fatigue HEENT: no blurry vision, hearing changes or sore throat Respiratory: no dyspnea, coughing, wheezing CV: no chest pain, no palpitations GI: no nausea, vomiting, abdominal pain, diarrhea, constipation GU: no dysuria, burning on urination, has increased urinary frequency, no hematuria  Ext: no leg edema Neuro: no unilateral weakness, numbness, or tingling, no vision change or hearing loss Skin: no rash, no skin tear. MSK: has lower back/right flank pain Heme: No easy bruising.  Travel history: No recent long distant travel.   Allergy:  Allergies  Allergen Reactions   Contrast Media [Iodinated Contrast Media] Hives and Cough   Codeine Anxiety   Morphine And Codeine Other (See Comments)    Tremors   Sulfa Antibiotics Nausea And Vomiting    Past Medical History:  Diagnosis Date   Anxiety    panic attacks; no curren med.   Arthritis    knees   Breast cancer (HCC) 2014   RT LUMPECTOMY DCIS   Diabetes mellitus    NIDDM   GERD (gastroesophageal reflux disease)    H/O hiatal hernia    Heart murmur    states never had  any problems   Hypertension    under control, has been on med. > 10 yrs.   Hypothyroidism    Medial meniscus tear 04/2011   right knee   Nocturia    3-4 x/night   Paget's bone disease    hips   Personal history of radiation  therapy 2014   right BREAST CA   PONV (postoperative nausea and vomiting)     Past Surgical History:  Procedure Laterality Date   ABDOMINAL HYSTERECTOMY  1978   partial   BACK SURGERY  04/1990, 1996   BREAST BIOPSY Right 03/14/2012   2 areas UOQ of DCIS   BREAST CYST ASPIRATION Left    BREAST LUMPECTOMY Right 03/29/2012   right breast DCIS of two areas. Clear margins   BURCH PROCEDURE  09/1989   with rectal repair   CHOLECYSTECTOMY  11/2005   FOOT SURGERY  1992   left   HAND SURGERY  10/1999   nerve repair right thumb   KNEE SURGERY  07/31/2009   right   KNEE SURGERY  2009   left   THYROID LOBECTOMY  03/1992   left    Social History:  reports that she has never smoked. She has never used smokeless tobacco. She reports that she does not drink alcohol and does not use drugs.  Family History:  Family History  Problem Relation Age of Onset   Breast cancer Mother    Breast cancer Sister 51   Breast cancer Paternal Aunt    Breast cancer Paternal Grandmother    Breast cancer Cousin        maternal side 1st cousin     Prior to Admission medications   Medication Sig Start Date End Date Taking? Authorizing Provider  ACCU-CHEK AVIVA PLUS test strip USE 1 STRIP TO CHECK GLUCOSE THREE TIMES DAILY 12/03/17   [provider]  alendronate (FOSAMAX) 70 MG tablet Take 70 mg by mouth once a week. Take with a full glass of water on an empty stomach.    [provider]  amLODipine (NORVASC) 5 MG tablet Take 5 mg by mouth daily.    [provider]  aspirin 325 MG tablet Take 325 mg by mouth daily.     [provider]  atorvastatin (LIPITOR) 40 MG tablet Take 40 mg by mouth at bedtime.    [provider]  busPIRone (BUSPAR) 5 MG tablet Take 5 mg by mouth 2 (two) times daily.    [provider]  Calcium Carb-Cholecalciferol 600-800 MG-UNIT TABS Take 1 tablet by mouth daily with breakfast.     [provider]  carvedilol (COREG)  3.125 MG tablet Take 3.125 mg by mouth 2 (two) times daily with a meal.    [provider]  escitalopram (LEXAPRO) 10 MG tablet Take 1 tablet (10 mg total) by mouth daily. 11/12/21 02/10/22  Willy Eddy, MD  fenofibrate 160 MG tablet Take 1 tablet (160 mg total) by mouth daily. 12/05/17   Salary, Jetty Duhamel D, MD  ferrous fumarate-b12-vitamic C-folic acid (TRINSICON / FOLTRIN) capsule Take 1 capsule by mouth 2 (two) times daily after a meal. 08/27/21   Arnetha Courser, MD  Glucose Blood (ACCU-CHEK AVIVA PLUS VI) 1 each as directed 07/25/14   [provider]  levothyroxine (SYNTHROID, LEVOTHROID) 50 MCG tablet Take 50 mcg by mouth daily.     [provider]  lidocaine (LIDODERM) 5 % Place 1 patch onto the skin daily. Remove & Discard patch within 12 hours or  as directed by MD 08/27/21   Arnetha Courser, MD  lisinopril-hydrochlorothiazide (ZESTORETIC) 20-12.5 MG tablet Take 1 tablet by mouth daily.    [provider]  metFORMIN (GLUCOPHAGE) 500 MG tablet Take 500 mg by mouth 2 (two) times daily with a meal.    [provider]  pantoprazole (PROTONIX) 40 MG tablet Take 40 mg by mouth daily.     [provider]    Physical Exam: Vitals:   11/01/22 1452 11/01/22 1959 11/01/22 2130  BP: 137/60 (!) 167/68 (!) 158/63  Pulse: 71 63 64  Resp: 18 16 15   Temp: 98 F (36.7 C) (!) 97.5 F (36.4 C) 97.6 F (36.4 C)  TempSrc:  Oral Oral  SpO2: 100% 99% 99%   General: Not in acute distress HEENT:       Eyes: PERRL, EOMI, no jaundice       ENT: No discharge from the ears and nose, no pharynx injection, no tonsillar enlargement.        Neck: No JVD, no bruit, no mass felt. Heme: No neck lymph node enlargement. Cardiac: S1/S2, RRR, No gallops or rubs. Respiratory: No rales, wheezing, rhonchi or rubs. GI: Soft, nondistended, nontender, no rebound pain, no organomegaly, BS present. GU: No hematuria.  Has right CVA tenderness Ext: No pitting leg edema  bilaterally. 1+DP/PT pulse bilaterally. Musculoskeletal: Has tenderness to midline lower back Skin: No rashes.  Has a tiny area of bruise over right hip Neuro: Alert, oriented X3, cranial nerves II-XII grossly intact, moves all extremities Psych: Patient is not psychotic, no suicidal or hemocidal ideation.  Labs on Admission: I have personally reviewed following labs and imaging studies  CBC: Recent Labs  Lab 11/01/22 1453  WBC 5.1  HGB 11.0*  HCT 34.4*  MCV 91.0  PLT 211   Basic Metabolic Panel: Recent Labs  Lab 11/01/22 1453  NA 138  K 3.9  CL 107  CO2 23  GLUCOSE 154*  BUN 37*  CREATININE 1.15*  CALCIUM 9.2   GFR: CrCl cannot be calculated (Unknown ideal weight.). Liver Function Tests: Recent Labs  Lab 11/01/22 1453  AST 20  ALT 15  ALKPHOS 27*  BILITOT 0.4  PROT 6.6  ALBUMIN 3.5   Recent Labs  Lab 11/01/22 1453  LIPASE 52*   No results for input(s): "AMMONIA" in the last 168 hours. Coagulation Profile: No results for input(s): "INR", "PROTIME" in the last 168 hours. Cardiac Enzymes: No results for input(s): "CKTOTAL", "CKMB", "CKMBINDEX", "TROPONINI" in the last 168 hours. BNP (last 3 results) No results for input(s): "PROBNP" in the last 8760 hours. HbA1C: No results for input(s): "HGBA1C" in the last 72 hours. CBG: Recent Labs  Lab 11/01/22 2208  GLUCAP 86   Lipid Profile: No results for input(s): "CHOL", "HDL", "LDLCALC", "TRIG", "CHOLHDL", "LDLDIRECT" in the last 72 hours. Thyroid Function Tests: No results for input(s): "TSH", "T4TOTAL", "FREET4", "T3FREE", "THYROIDAB" in the last 72 hours. Anemia Panel: No results for input(s): "VITAMINB12", "FOLATE", "FERRITIN", "TIBC", "IRON", "RETICCTPCT" in the last 72 hours. Urine analysis:    Component Value Date/Time   COLORURINE YELLOW (A) 11/01/2022 1717   APPEARANCEUR CLOUDY (A) 11/01/2022 1717   APPEARANCEUR Hazy 06/23/2011 1516   LABSPEC 1.021 11/01/2022 1717   LABSPEC 1.013 06/23/2011  1516   PHURINE 5.0 11/01/2022 1717   GLUCOSEU NEGATIVE 11/01/2022 1717   GLUCOSEU Negative 06/23/2011 1516   HGBUR NEGATIVE 11/01/2022 1717   BILIRUBINUR NEGATIVE 11/01/2022 1717   BILIRUBINUR Negative 06/23/2011 1516   KETONESUR NEGATIVE  11/01/2022 1717   PROTEINUR NEGATIVE 11/01/2022 1717   NITRITE POSITIVE (A) 11/01/2022 1717   LEUKOCYTESUR LARGE (A) 11/01/2022 1717   LEUKOCYTESUR Negative 06/23/2011 1516   Sepsis Labs: @LABRCNTIP (procalcitonin:4,lacticidven:4) )No results found for this or any previous visit (from the past 240 hour(s)).   Radiological Exams on Admission: CT L-SPINE NO CHARGE  Result Date: 11/01/2022 CLINICAL DATA:  Right-sided low back pain EXAM: CT LUMBAR SPINE WITHOUT CONTRAST TECHNIQUE: Multidetector CT imaging of the lumbar spine was performed without intravenous contrast administration. Multiplanar CT image reconstructions were also generated. RADIATION DOSE REDUCTION: This exam was performed according to the departmental dose-optimization program which includes automated exposure control, adjustment of the mA and/or kV according to patient size and/or use of iterative reconstruction technique. COMPARISON:  09/26/2015 FINDINGS: Segmentation: 5 lumbar type vertebrae. Alignment: No traumatic listhesis. Vertebrae: Chronic pagetoid changes of the L2 vertebral body and posterior elements, unchanged in appearance compared to multiple previous studies. No acute fracture of the lumbar spine. No suspicious lytic or sclerotic bone lesion. Paraspinal and other soft tissues: Partially imaged fluid collection in the subcutaneous soft tissues of the right low back, better assessed on concurrently obtained CT abdomen pelvis. Aortic atherosclerosis. Hyperdense lower pole right renal cyst. Sigmoid diverticulosis. Disc levels: Chronic disc height loss and endplate spurring at L5-S1. Mild multilevel facet arthropathy. High-grade foraminal stenosis bilaterally at L5-S1 and on the left at  L4-L5. Expansion of the L2 vertebral body contributes to mass effect upon the canal at this level. IMPRESSION: 1. No acute fracture or traumatic listhesis of the lumbar spine. 2. Chronic pagetoid changes of the L2 vertebral body and posterior elements, unchanged in appearance compared to multiple previous studies. 3. High-grade foraminal stenosis bilaterally at L5-S1 and on the left at L4-L5. 4. Partially imaged fluid collection in the subcutaneous soft tissues of the right low back, better assessed on concurrently obtained CT abdomen pelvis. 5. Aortic atherosclerosis (ICD10-I70.0). Electronically Signed   By: Duanne Guess D.O.   On: 11/01/2022 20:02   CT Renal Stone Study  Result Date: 11/01/2022 CLINICAL DATA:  Abdominal/flank pain, stone suspected EXAM: CT ABDOMEN AND PELVIS WITHOUT CONTRAST TECHNIQUE: Multidetector CT imaging of the abdomen and pelvis was performed following the standard protocol without IV contrast. RADIATION DOSE REDUCTION: This exam was performed according to the departmental dose-optimization program which includes automated exposure control, adjustment of the mA and/or kV according to patient size and/or use of iterative reconstruction technique. COMPARISON:  01/22/2019 FINDINGS: Lower chest: No acute abnormality. Hepatobiliary: No focal liver abnormality is seen. Status post cholecystectomy. No biliary dilatation. Pancreas: Unremarkable. No pancreatic ductal dilatation or surrounding inflammatory changes. Spleen: Normal in size without focal abnormality. Adrenals/Urinary Tract: Unremarkable adrenal glands. Stable 3.7 cm rounded intermediate density lesion arising from the inferior pole of the right kidney compatible with a hyperdense cyst. No renal stone or hydronephrosis. Urinary bladder within normal limits for the degree of distension. Stomach/Bowel: Stomach is within normal limits. Colonic diverticulosis. No evidence of bowel wall thickening, distention, or inflammatory  changes. Vascular/Lymphatic: Aortic atherosclerosis. No enlarged abdominal or pelvic lymph nodes. Reproductive: Status post hysterectomy. No adnexal masses. Other: No free fluid. No abdominopelvic fluid collection. No pneumoperitoneum. No abdominal wall hernia. Musculoskeletal: There are 2 adjacent well-circumscribed fluid collections within the subcutaneous soft tissues of the right lower back measuring 8.0 x 2.8 x 7.5 cm (volume = 88 cm^3) and 4.0 x 2.1 x 3.2 cm (volume = 14 cm^3). Both of these collections have well-defined walls. There is surrounding fat  stranding within the adjacent subcutaneous fat. Multiple chronic appearing bilateral rib fractures. Unchanged appearance of the proximal left femur and L2 vertebral body which may be related to Paget's disease. IMPRESSION: 1. No renal stone or hydronephrosis. 2. There are two adjacent well-circumscribed fluid collections within the subcutaneous soft tissues of the right lower back measuring 8.0 x 2.8 x 7.5 cm and 4.0 x 2.1 x 3.2 cm. There is surrounding fat stranding within the adjacent subcutaneous fat. These could represent liquefied hematomas or abscesses. 3. Colonic diverticulosis without evidence of acute diverticulitis. 4. Aortic atherosclerosis (ICD10-I70.0). Electronically Signed   By: Duanne Guess D.O.   On: 11/01/2022 19:58      Assessment/Plan Principal Problem:   Acute pyelonephritis Active Problems:   Acquired hypothyroidism   Essential hypertension   Type II diabetes mellitus with renal manifestations (HCC)   Mixed hyperlipidemia   Chronic kidney disease, stage 3a (HCC)   TIA (transient ischemic attack)   Coronary artery disease   Anxiety and depression   History of progressive supranuclear palsy   Fall at home, initial encounter   Assessment and Plan:  Acute pyelonephritis: Patient has increased urinary frequency, positive urinary analysis and positive right CVA tenderness, indicating acute pyelonephritis. Patient does  not have fever or leukocytosis.  Clinically not septic. -Admitted to MedSurg bed as inpatient -IV Rocephin -Follow-up urine culture  Acquired hypothyroidism -Synthroid  Essential hypertension -IV hydralazine as needed -Amlodipine, Coreg, Zestoretic  Type II diabetes mellitus with renal manifestations (HCC): Recent A1c 5.0, well-controlled.  Patient is taking metformin -Sliding scale insulin  Mixed hyperlipidemia -Lipitor, fenofibrate  Chronic kidney disease, stage 3a (HCC): Renal function close to baseline.  Recent baseline creatinine 1.0-1.3.  Her creatinine is 1.15, BUN 37, GFR 50 -Follow-up with BMP  TIA (transient ischemic attack) -Hold aspirin due to possible hematoma -Lipitor  Coronary artery disease -Hold aspirin due to hematoma -Lipitor  Anxiety and depression -continue home medications  History of progressive supranuclear palsy -Fall precaution  Fall at home, initial encounter: Strongly denies head or neck injury.  Refused CT scan of head and neck. -Fall precaution -PT/OT  Possible hematoma: CT showed two well-circumscribed fluid collections within the subcutaneous soft tissues of the right lower back.  Likely due to hematoma from recent fall.  Low suspicions of abscess since patient does not have fever or leukocytosis.  Hemoglobin stable 11.0 (Hgb was 10.3 on 08/17/2022). -Repeat CBC pneumonia -Monitor closely      DVT ppx: SCD  Code Status: DNR (I discussed with patient and explained the meaning of CODE STATUS. Patient wants to be DNR)  Family Communication: I have tried to call her sister who did not pick up the phone, I left a message to her.  Disposition Plan:  Anticipate discharge back to previous environment  Consults called:  none  Admission status and Level of care: Med-Surg:   as inpt      Dispo: The patient is from: Home              Anticipated d/c is to: Home              Anticipated d/c date is: 2 days              Patient currently  is not medically stable to d/c.    Severity of Illness:  The appropriate patient status for this patient is INPATIENT. Inpatient status is judged to be reasonable and necessary in order to provide the required intensity of service to ensure  the patient's safety. The patient's presenting symptoms, physical exam findings, and initial radiographic and laboratory data in the context of their chronic comorbidities is felt to place them at high risk for further clinical deterioration. Furthermore, it is not anticipated that the patient will be medically stable for discharge from the hospital within 2 midnights of admission.   * I certify that at the point of admission it is my clinical judgment that the patient will require inpatient hospital care spanning beyond 2 midnights from the point of admission due to high intensity of service, high risk for further deterioration and high frequency of surveillance required.*       Date of Service 11/01/2022    Lorretta Harp [facility]   If 7PM-7AM, please contact night-coverage [email or web address] 11/01/2022, 10:34 PM

## 2022-11-01 NOTE — ED Triage Notes (Signed)
Pt comes with c/o lower right sided back pain that started today. Pt denies any injuries or falls. Pt  has odor noticed on Friday by family to her urine.

## 2022-11-01 NOTE — ED Provider Notes (Signed)
[facility] Provider Note    Event Date/Time   First MD Initiated Contact with Patient 11/01/22 1621     (approximate)   History   Chief Complaint Back Pain   HPI  Brittney Ray is a 72 y.o. female with past medical history of hypertension, diabetes, CAD, CKD, and progressive supranuclear palsy who presents to the ED complaining of back pain.  Majority of history is obtained from patient's sister at bedside who reports that the patient has complained of increasing pain coming around the right side of her back since this morning.  Pain is described as sharp and constant, sister is not aware of any recent trauma to her back but does state that patient falls frequently due to her progressive supranuclear palsy.  Sister is not aware of any prior back problems and patient denies similar pain in the past.  She denies any fevers, abdominal pain, dysuria, nausea, or vomiting.  She has not taken anything for pain prior to arrival.  She denies any numbness in her groin and has not had any urinary incontinence.     Physical Exam   Triage Vital Signs: ED Triage Vitals  Encounter Vitals Group     BP 11/01/22 1452 137/60     Systolic BP Percentile --      Diastolic BP Percentile --      Pulse Rate 11/01/22 1452 71     Resp 11/01/22 1452 18     Temp 11/01/22 1452 98 F (36.7 C)     Temp src --      SpO2 11/01/22 1452 100 %     Weight --      Height --      Head Circumference --      Peak Flow --      Pain Score 11/01/22 1451 10     Pain Loc --      Pain Education --      Exclude from Growth Chart --     Most recent vital signs: Vitals:   11/01/22 1959 11/01/22 2130  BP: (!) 167/68 (!) 158/63  Pulse: 63 64  Resp: 16 15  Temp: (!) 97.5 F (36.4 C) 97.6 F (36.4 C)  SpO2: 99% 99%    Constitutional: Alert and oriented. Eyes: Conjunctivae are normal. Head: Atraumatic. Nose: No congestion/rhinnorhea. Mouth/Throat: Mucous membranes are moist.   Cardiovascular: Normal rate, regular rhythm. Grossly normal heart sounds.  2+ radial pulses bilaterally. Respiratory: Normal respiratory effort.  No retractions. Lungs CTAB. Gastrointestinal: Soft and nontender.  No CVA tenderness bilaterally.  No distention. Musculoskeletal: No lower extremity tenderness nor edema.  Tenderness to palpation noted over right paraspinal area with no midline lumbar spinal tenderness to palpation.  Tenderness wraps around to her right hip area. Neurologic:  Normal speech and language. No gross focal neurologic deficits are appreciated.    ED Results / Procedures / Treatments   Labs (all labs ordered are listed, but only abnormal results are displayed) Labs Reviewed  CBC - Abnormal; Notable for the following components:      Result Value   RBC 3.78 (*)    Hemoglobin 11.0 (*)    HCT 34.4 (*)    All other components within normal limits  BASIC METABOLIC PANEL - Abnormal; Notable for the following components:   Glucose, Bld 154 (*)    BUN 37 (*)    Creatinine, Ser 1.15 (*)    GFR, Estimated 50 (*)    All other components  within normal limits  URINALYSIS, ROUTINE W REFLEX MICROSCOPIC - Abnormal; Notable for the following components:   Color, Urine YELLOW (*)    APPearance CLOUDY (*)    Nitrite POSITIVE (*)    Leukocytes,Ua LARGE (*)    Bacteria, UA MANY (*)    All other components within normal limits  HEPATIC FUNCTION PANEL - Abnormal; Notable for the following components:   Alkaline Phosphatase 27 (*)    All other components within normal limits  LIPASE, BLOOD - Abnormal; Notable for the following components:   Lipase 52 (*)    All other components within normal limits  URINE CULTURE  PROTIME-INR  APTT  BASIC METABOLIC PANEL  CBC   RADIOLOGY CT renal protocol reviewed and interpreted by me with no hydronephrosis or ureteral stone.  PROCEDURES:  Critical Care performed: No  Procedures   MEDICATIONS ORDERED IN ED: Medications   lidocaine (LIDODERM) 5 % 1 patch (1 patch Transdermal Patch Applied 11/01/22 1733)  ondansetron (ZOFRAN) injection 4 mg (has no administration in time range)  hydrALAZINE (APRESOLINE) injection 5 mg (has no administration in time range)  acetaminophen (TYLENOL) tablet 650 mg (has no administration in time range)  oxyCODONE-acetaminophen (PERCOCET/ROXICET) 5-325 MG per tablet 1 tablet (has no administration in time range)  fentaNYL (SUBLIMAZE) injection 12.5 mcg (has no administration in time range)  insulin aspart (novoLOG) injection 0-9 Units (has no administration in time range)  insulin aspart (novoLOG) injection 0-5 Units (has no administration in time range)  cefTRIAXone (ROCEPHIN) 1 g in sodium chloride 0.9 % 100 mL IVPB (has no administration in time range)  morphine (PF) 2 MG/ML injection 2 mg (2 mg Intravenous Given 11/01/22 1734)  cefTRIAXone (ROCEPHIN) 1 g in sodium chloride 0.9 % 100 mL IVPB (0 g Intravenous Stopped 11/01/22 2000)  morphine (PF) 4 MG/ML injection 4 mg (4 mg Intravenous Given 11/01/22 1839)     IMPRESSION / MDM / ASSESSMENT AND PLAN / ED COURSE  I reviewed the triage vital signs and the nursing notes.                              72 y.o. female with past medical history of hypertension, diabetes, CAD, CKD, and progressive supranuclear palsy who presents to the ED with about 12 hours of increasing pain over her right lower back.  Patient's presentation is most consistent with acute presentation with potential threat to life or bodily function.  Differential diagnosis includes, but is not limited to, lumbar strain, lumbar radiculopathy, compression fracture, cauda equina, kidney stone, pyelonephritis.  Patient uncomfortable appearing but in no acute distress, vital signs are unremarkable.  She is neurovascularly intact to her bilateral lower extremities, no findings concerning for cauda equina.  She does have frequent falls and given tenderness over her back, we  will check CT of her lumbar spine.  Will also check CT of her abdomen/pelvis for evidence of kidney stone or other contributing pathology.  Labs are reassuring with stable CKD, no significant anemia, leukocytosis, or electrolyte abnormality noted.  We will add on LFTs and lipase, urinalysis also pending.  We will treat symptomatically with IV morphine and Lidoderm patch.  Urinalysis concerning for UTI and presentation consistent with pyelonephritis.  CT imaging negative for kidney stone or other urinary obstruction.  Patient does have 2 fluid collections in subcutaneous tissues of right lower back representing abscess versus hematoma.  On exam there are no findings concerning for infection and  I have a very low suspicion for abscess.  With her multiple recent falls, these seem much more likely to be hematoma.  Patient given IV Rocephin for pyelonephritis and case discussed with hospitalist for admission.      FINAL CLINICAL IMPRESSION(S) / ED DIAGNOSES   Final diagnoses:  Pyelonephritis     Rx / DC Orders   ED Discharge Orders     None        Note:  This document was prepared using Dragon voice recognition software and may include unintentional dictation errors.   Chesley Noon, MD 11/01/22 2209

## 2022-11-02 ENCOUNTER — Encounter: Payer: Self-pay | Admitting: Internal Medicine

## 2022-11-02 DIAGNOSIS — Y92009 Unspecified place in unspecified non-institutional (private) residence as the place of occurrence of the external cause: Secondary | ICD-10-CM

## 2022-11-02 DIAGNOSIS — W19XXXA Unspecified fall, initial encounter: Secondary | ICD-10-CM

## 2022-11-02 DIAGNOSIS — N10 Acute tubulo-interstitial nephritis: Secondary | ICD-10-CM | POA: Diagnosis not present

## 2022-11-02 DIAGNOSIS — E039 Hypothyroidism, unspecified: Secondary | ICD-10-CM | POA: Diagnosis not present

## 2022-11-02 DIAGNOSIS — T148XXA Other injury of unspecified body region, initial encounter: Secondary | ICD-10-CM

## 2022-11-02 DIAGNOSIS — I10 Essential (primary) hypertension: Secondary | ICD-10-CM | POA: Diagnosis not present

## 2022-11-02 LAB — GLUCOSE, CAPILLARY
Glucose-Capillary: 97 mg/dL (ref 70–99)
Glucose-Capillary: 117 mg/dL — ABNORMAL HIGH (ref 70–99)

## 2022-11-02 LAB — CBC
HCT: 33.6 % — ABNORMAL LOW (ref 36.0–46.0)
Hemoglobin: 10.7 g/dL — ABNORMAL LOW (ref 12.0–15.0)
MCH: 29.2 pg (ref 26.0–34.0)
MCHC: 31.8 g/dL (ref 30.0–36.0)
MCV: 91.8 fL (ref 80.0–100.0)
Platelets: 195 10*3/uL (ref 150–400)
RBC: 3.66 MIL/uL — ABNORMAL LOW (ref 3.87–5.11)
RDW: 14.3 % (ref 11.5–15.5)
WBC: 7.1 10*3/uL (ref 4.0–10.5)
nRBC: 0 % (ref 0.0–0.2)

## 2022-11-02 LAB — CBG MONITORING, ED
Glucose-Capillary: 106 mg/dL — ABNORMAL HIGH (ref 70–99)
Glucose-Capillary: 103 mg/dL — ABNORMAL HIGH (ref 70–99)

## 2022-11-02 LAB — BASIC METABOLIC PANEL
Anion gap: 8 (ref 5–15)
BUN: 31 mg/dL — ABNORMAL HIGH (ref 8–23)
CO2: 25 mmol/L (ref 22–32)
Calcium: 9 mg/dL (ref 8.9–10.3)
Chloride: 105 mmol/L (ref 98–111)
Creatinine, Ser: 1.15 mg/dL — ABNORMAL HIGH (ref 0.44–1.00)
GFR, Estimated: 50 mL/min — ABNORMAL LOW (ref >60)
Glucose, Bld: 128 mg/dL — ABNORMAL HIGH (ref 70–99)
Potassium: 3.8 mmol/L (ref 3.5–5.1)
Sodium: 138 mmol/L (ref 135–145)

## 2022-11-02 LAB — APTT: aPTT: 28 s (ref 24–36)

## 2022-11-02 LAB — PROTIME-INR
INR: 1.1 (ref 0.8–1.2)
Prothrombin Time: 13.9 s (ref 11.4–15.2)

## 2022-11-02 MED ORDER — FENOFIBRATE 160 MG PO TABS
160.0000 mg | ORAL_TABLET | Freq: Every day | ORAL | Status: DC
Start: 2022-11-02 — End: 2022-11-05
  Administered 2022-11-02 – 2022-11-05 (×4): 160 mg via ORAL
  Filled 2022-11-02 (×5): qty 1

## 2022-11-02 MED ORDER — LISINOPRIL-HYDROCHLOROTHIAZIDE 20-12.5 MG PO TABS: 1.0000 | ORAL_TABLET | Freq: Every day | ORAL | Status: DC

## 2022-11-02 MED ORDER — LEVOTHYROXINE SODIUM 50 MCG PO TABS
25.0000 ug | ORAL_TABLET | Freq: Every day | ORAL | Status: DC
  Administered 2022-11-02 – 2022-11-05 (×4): 25 ug via ORAL
  Filled 2022-11-02 (×4): qty 1

## 2022-11-02 MED ORDER — HYDROCHLOROTHIAZIDE 12.5 MG PO TABS
12.5000 mg | ORAL_TABLET | Freq: Every day | ORAL | Status: DC
  Administered 2022-11-02 – 2022-11-05 (×4): 12.5 mg via ORAL
  Filled 2022-11-02 (×4): qty 1

## 2022-11-02 MED ORDER — PANTOPRAZOLE SODIUM 40 MG PO TBEC
40.0000 mg | DELAYED_RELEASE_TABLET | Freq: Every day | ORAL | Status: DC
  Administered 2022-11-02 – 2022-11-05 (×4): 40 mg via ORAL
  Filled 2022-11-02 (×4): qty 1

## 2022-11-02 MED ORDER — CARVEDILOL 6.25 MG PO TABS
3.1250 mg | ORAL_TABLET | Freq: Two times a day (BID) | ORAL | Status: DC
  Administered 2022-11-02 – 2022-11-05 (×6): 3.125 mg via ORAL
  Filled 2022-11-02 (×6): qty 1

## 2022-11-02 MED ORDER — ESCITALOPRAM OXALATE 10 MG PO TABS
10.0000 mg | ORAL_TABLET | Freq: Every day | ORAL | Status: DC
  Administered 2022-11-02 – 2022-11-05 (×4): 10 mg via ORAL
  Filled 2022-11-02 (×4): qty 1

## 2022-11-02 MED ORDER — LISINOPRIL 20 MG PO TABS
20.0000 mg | ORAL_TABLET | Freq: Every day | ORAL | Status: DC
  Administered 2022-11-02 – 2022-11-05 (×4): 20 mg via ORAL
  Filled 2022-11-02 (×2): qty 1
  Filled 2022-11-02: qty 2
  Filled 2022-11-02: qty 1

## 2022-11-02 MED ORDER — AMLODIPINE BESYLATE 5 MG PO TABS
5.0000 mg | ORAL_TABLET | Freq: Every day | ORAL | Status: DC
  Administered 2022-11-02 – 2022-11-05 (×4): 5 mg via ORAL
  Filled 2022-11-02 (×4): qty 1

## 2022-11-02 MED ORDER — ATORVASTATIN CALCIUM 20 MG PO TABS
40.0000 mg | ORAL_TABLET | Freq: Every day | ORAL | Status: DC
  Administered 2022-11-02 – 2022-11-04 (×4): 40 mg via ORAL
  Filled 2022-11-02 (×4): qty 2

## 2022-11-02 MED ORDER — BUSPIRONE HCL 10 MG PO TABS
5.0000 mg | ORAL_TABLET | Freq: Two times a day (BID) | ORAL | Status: DC
  Administered 2022-11-02 – 2022-11-05 (×8): 5 mg via ORAL
  Filled 2022-11-02 (×8): qty 1

## 2022-11-02 NOTE — ED Notes (Signed)
Pharmacy messaged for missing am meds

## 2022-11-02 NOTE — Evaluation (Signed)
Physical Therapy Evaluation Patient Details Name: Brittney Ray MRN: [id number] DOB: May 11, 1950 Today's Date: 11/02/2022  History of Present Illness  Brittney Ray is a 72 y.o. female with medical history significant of progressive supranuclear palsy, hypertension, hyperlipidemia, diabetes mellitus, CAD, TIA, hypothyroidism, depression with anxiety, CKD-3A, Paget bone disease, breast cancer (s/p of right lumpectomy, radiation therapy), who presents with lower back/flank pain, increased urinary frequency.  Clinical Impression  Patient admitted with the above. PTA, patient lives with husband and requires assistance for mobility with RW and ADLs. Patient has history of falls. Patient presents with weakness, impaired balance, decreased activity tolerance, and impaired cognition (unsure of baseline as family not present during session). Required minA for bed mobility with cues for log roll to minimize strain on back 2/2 current back pain. Able to stand from very elevated surface with RW and CGA. Took sidesteps at EOB with CGA but refused forward mobility and stated "that's all I got". Patient will benefit from skilled PT services during acute stay to address listed deficits. Patient will benefit from ongoing therapy at discharge to maximize functional independence and safety.         If plan is discharge home, recommend the following: A little help with walking and/or transfers;A little help with bathing/dressing/bathroom;Assistance with cooking/housework;Direct supervision/assist for financial management;Direct supervision/assist for medications management;Assist for transportation;Help with stairs or ramp for entrance;Supervision due to cognitive status   Can travel by private vehicle   Yes    Equipment Recommendations Rolling Daneille Desilva (2 wheels)  Recommendations for Other Services       Functional Status Assessment Patient has had a recent decline in their functional status and  demonstrates the ability to make significant improvements in function in a reasonable and predictable amount of time.     Precautions / Restrictions Precautions Precautions: Fall Restrictions Weight Bearing Restrictions: No      Mobility  Bed Mobility Overal bed mobility: Needs Assistance Bed Mobility: Supine to Sit, Sit to Supine     Supine to sit: Min assist Sit to supine: Contact guard assist        Transfers Overall transfer level: Needs assistance Equipment used: Rolling Anquanette Bahner (2 wheels) Transfers: Sit to/from Stand Sit to Stand: Contact guard assist           General transfer comment: from very elevated surface    Ambulation/Gait Ambulation/Gait assistance: Contact guard assist Gait Distance (Feet): 4 Feet Assistive device: Rolling Elliyah Liszewski (2 wheels) Gait Pattern/deviations: Step-to pattern Gait velocity: decreased     General Gait Details: sidesteps at EOB. Encouraged patient to ambulate forward, patient stating "that's all I got" and began to sit back on the bed  Stairs            Wheelchair Mobility     Tilt Bed    Modified Rankin (Stroke Patients Only)       Balance Overall balance assessment: Needs assistance, History of Falls Sitting-balance support: No upper extremity supported, Feet unsupported Sitting balance-Leahy Scale: Fair     Standing balance support: Bilateral upper extremity supported, Reliant on assistive device for balance Standing balance-Leahy Scale: Poor                               Pertinent Vitals/Pain Pain Assessment Pain Assessment: Faces Faces Pain Scale: Hurts whole lot Pain Location: back Pain Descriptors / Indicators: Aching Pain Intervention(s): Monitored during session, Limited activity within patient's tolerance, Repositioned, Patient requesting pain meds-RN  notified    Home Living Family/patient expects to be discharged to:: Private residence Living Arrangements: Spouse/significant  other Available Help at Discharge: Family;Available 24 hours/day Type of Home: House Home Access: Stairs to enter Entrance Stairs-Rails: None (on the back, where she enters) Secretary/administrator of Steps: 2-3   Home Layout: One level Home Equipment: Agricultural consultant (2 wheels);Shower seat Additional Comments: Pt states she has two sisters, one comes MWF to assist and the other comes Sat-Sun; also has an aide that comes 1x/week to assist with ADLs. Family assists with all IADLs.    Prior Function Prior Level of Function : Needs assist;History of Falls (last six months)       Physical Assist : Mobility (physical);ADLs (physical) Mobility (physical): Gait;Stairs ADLs (physical): IADLs;Toileting;Dressing;Bathing;Grooming Mobility Comments: Pt states she uses RW during mobility; always has someone "holding onto me" during mobility. Multiple falls (has PSP); notes most recent fall was about 10 days ago. ADLs Comments: Assistance for nearly all ADLs; showers 1x/week with aide assist; assist for standing to groom at sink, assist for toileting, assist for bathing. Does self-feed. Enjoys watching FRIENDS on TV; endorses no other hobbies.     Extremity/Trunk Assessment   Upper Extremity Assessment Upper Extremity Assessment: Defer to OT evaluation    Lower Extremity Assessment Lower Extremity Assessment: Generalized weakness       Communication   Communication Communication: No apparent difficulties  Cognition Arousal: Alert Behavior During Therapy: WFL for tasks assessed/performed Overall Cognitive Status: No family/caregiver present to determine baseline cognitive functioning                                 General Comments: Pt is oriented to person, place, situation; thinks it is November 12, 2021. Is pleasant and cooperative. Moves slowly 2/2 pain; intermittently groans 2/2 pain. Unable to describe how she falls.        General Comments General comments (skin  integrity, edema, etc.): Pt on room air throughout session; O2 normal. Cooperative, but in pain during session.    Exercises     Assessment/Plan    PT Assessment Patient needs continued PT services  PT Problem List Decreased strength;Decreased activity tolerance;Decreased balance;Decreased mobility;Decreased cognition;Decreased coordination;Decreased knowledge of use of DME;Decreased safety awareness;Decreased knowledge of precautions       PT Treatment Interventions DME instruction;Gait training;Functional mobility training;Therapeutic activities;Therapeutic exercise;Balance training;Stair training;Patient/family education    PT Goals (Current goals can be found in the Care Plan section)  Acute Rehab PT Goals Patient Stated Goal: did not state PT Goal Formulation: With patient Time For Goal Achievement: 11/16/22 Potential to Achieve Goals: Fair    Frequency Min 1X/week     Co-evaluation               AM-PAC PT "6 Clicks" Mobility  Outcome Measure Help needed turning from your back to your side while in a flat bed without using bedrails?: A Little Help needed moving from lying on your back to sitting on the side of a flat bed without using bedrails?: A Little Help needed moving to and from a bed to a chair (including a wheelchair)?: A Little Help needed standing up from a chair using your arms (e.g., wheelchair or bedside chair)?: A Little Help needed to walk in hospital room?: A Little Help needed climbing 3-5 steps with a railing? : A Lot 6 Click Score: 17    End of Session   Activity Tolerance: Patient  tolerated treatment well;Patient limited by pain Patient left: in bed;with call bell/phone within reach Nurse Communication: Mobility status PT Visit Diagnosis: Unsteadiness on feet (R26.81);Muscle weakness (generalized) (M62.81);History of falling (Z91.81)    Time: 1324-4010 PT Time Calculation (min) (ACUTE ONLY): 8 min   Charges:   PT Evaluation $PT Eval  Moderate Complexity: 1 Mod   PT General Charges $$ ACUTE PT VISIT: 1 Visit         Maylon Peppers, PT, DPT Physical Therapist - [facility]   Trenyce Loera A Shahzaib Azevedo 11/02/2022, 1:16 PM

## 2022-11-02 NOTE — Progress Notes (Signed)
Progress Note   Patient: Brittney Ray XBM:[id number] DOB: 1950/03/11 DOA: 11/01/2022     1 DOS: the patient was seen and examined on 11/02/2022   Brief hospital course: Brittney Ray is a 72 y.o. female with medical history significant of progressive supranuclear palsy, hypertension, hyperlipidemia, diabetes mellitus, CAD, TIA, hypothyroidism, depression with anxiety, CKD-3A, Paget bone disease, breast cancer (s/p of right lumpectomy, radiation therapy), who presents with lower back/flank pain, increased urinary frequency.   Assessment and Plan: Acute pyelonephritis. Not clinically septic. Continue IV Rocephin therapy. Follow urine, blood cultures.  Possible bilateral hematomas. Most likely due to multiple falls. H&H remained stable. Continue pain control. Fall precautions.  Multiple falls. Patient does have history of supranuclear palsy. Continue fall precautions. PT OT evaluation. TOC for safe discharge plan.  Type 2 diabetes mellitus. Accu-Cheks, sliding scale insulin as per floor protocol. Hold oral hypoglycemics.  Chronic kidney disease stage III a. Renal function at baseline. Continue to follow daily renal function. Avoid nephrotoxic drugs. Essential hypertension Continue Coreg, amlodipine therapy.  Mixed hyperlipidemia. TIA. Continue Lipitor, fenofibrate therapy. Hold aspirin due to hematomas.  CAD. Continue Lipitor, hold aspirin  Anxiety and depression. Continue current home medications.  Out of bed to chair. Incentive spirometry. Nursing supportive care. Fall, aspiration precautions. DVT prophylaxis   Code Status: Limited: Do not attempt resuscitation (DNR) -DNR-LIMITED -Do Not Intubate/DNI   Subjective: Patient is seen and examined today morning. She has pain over right flank and back. Asks for pain meds. Eating fair, no nausea or abdominal pain.   Physical Exam: Vitals:   11/02/22 1300 11/02/22 1415 11/02/22 1500 11/02/22 1558  BP:  114/61 (!) 101/48 (!) 107/52 121/68  Pulse: 84 62 70 71  Resp: 20 15 15 18   Temp:    98.9 F (37.2 C)  TempSrc:      SpO2: 98% 97% 97% 98%    General - Elderly Caucasian female, in distress due to severe pain HEENT - PERRLA, EOMI, atraumatic head, non tender sinuses. Lung - Clear, bibasilar rales noted Heart - S1, S2 heard, no murmurs, rubs, trace pedal edema. Abdomen - Soft, non tender right flank tenderness, bowel sounds  Neuro - Alert, awake and oriented x 3, non focal exam. Skin - Warm and dry.  Data Reviewed:      Latest Ref Rng & Units 11/02/2022    5:54 AM 11/01/2022    2:53 PM 08/17/2022    4:09 PM  CBC  WBC 4.0 - 10.5 K/uL 7.1  5.1  3.6   Hemoglobin 12.0 - 15.0 g/dL 02.7  25.3  66.4   Hematocrit 36.0 - 46.0 % 33.6  34.4  30.6   Platelets 150 - 400 K/uL 195  211  137       Latest Ref Rng & Units 11/02/2022    5:54 AM 11/01/2022    2:53 PM 08/17/2022    4:09 PM  BMP  Glucose 70 - 99 mg/dL 403  474  259   BUN 8 - 23 mg/dL 31  37  33   Creatinine 0.44 - 1.00 mg/dL 5.63  8.75  6.43   Sodium 135 - 145 mmol/L 138  138  136   Potassium 3.5 - 5.1 mmol/L 3.8  3.9  3.8   Chloride 98 - 111 mmol/L 105  107  104   CO2 22 - 32 mmol/L 25  23  23    Calcium 8.9 - 10.3 mg/dL 9.0  9.2  8.5    CT L-SPINE NO  CHARGE  Result Date: 11/01/2022 CLINICAL DATA:  Right-sided low back pain EXAM: CT LUMBAR SPINE WITHOUT CONTRAST TECHNIQUE: Multidetector CT imaging of the lumbar spine was performed without intravenous contrast administration. Multiplanar CT image reconstructions were also generated. RADIATION DOSE REDUCTION: This exam was performed according to the departmental dose-optimization program which includes automated exposure control, adjustment of the mA and/or kV according to patient size and/or use of iterative reconstruction technique. COMPARISON:  09/26/2015 FINDINGS: Segmentation: 5 lumbar type vertebrae. Alignment: No traumatic listhesis. Vertebrae: Chronic pagetoid changes of  the L2 vertebral body and posterior elements, unchanged in appearance compared to multiple previous studies. No acute fracture of the lumbar spine. No suspicious lytic or sclerotic bone lesion. Paraspinal and other soft tissues: Partially imaged fluid collection in the subcutaneous soft tissues of the right low back, better assessed on concurrently obtained CT abdomen pelvis. Aortic atherosclerosis. Hyperdense lower pole right renal cyst. Sigmoid diverticulosis. Disc levels: Chronic disc height loss and endplate spurring at L5-S1. Mild multilevel facet arthropathy. High-grade foraminal stenosis bilaterally at L5-S1 and on the left at L4-L5. Expansion of the L2 vertebral body contributes to mass effect upon the canal at this level. IMPRESSION: 1. No acute fracture or traumatic listhesis of the lumbar spine. 2. Chronic pagetoid changes of the L2 vertebral body and posterior elements, unchanged in appearance compared to multiple previous studies. 3. High-grade foraminal stenosis bilaterally at L5-S1 and on the left at L4-L5. 4. Partially imaged fluid collection in the subcutaneous soft tissues of the right low back, better assessed on concurrently obtained CT abdomen pelvis. 5. Aortic atherosclerosis (ICD10-I70.0). Electronically Signed   By: Duanne Guess D.O.   On: 11/01/2022 20:02   CT Renal Stone Study  Result Date: 11/01/2022 CLINICAL DATA:  Abdominal/flank pain, stone suspected EXAM: CT ABDOMEN AND PELVIS WITHOUT CONTRAST TECHNIQUE: Multidetector CT imaging of the abdomen and pelvis was performed following the standard protocol without IV contrast. RADIATION DOSE REDUCTION: This exam was performed according to the departmental dose-optimization program which includes automated exposure control, adjustment of the mA and/or kV according to patient size and/or use of iterative reconstruction technique. COMPARISON:  01/22/2019 FINDINGS: Lower chest: No acute abnormality. Hepatobiliary: No focal liver  abnormality is seen. Status post cholecystectomy. No biliary dilatation. Pancreas: Unremarkable. No pancreatic ductal dilatation or surrounding inflammatory changes. Spleen: Normal in size without focal abnormality. Adrenals/Urinary Tract: Unremarkable adrenal glands. Stable 3.7 cm rounded intermediate density lesion arising from the inferior pole of the right kidney compatible with a hyperdense cyst. No renal stone or hydronephrosis. Urinary bladder within normal limits for the degree of distension. Stomach/Bowel: Stomach is within normal limits. Colonic diverticulosis. No evidence of bowel wall thickening, distention, or inflammatory changes. Vascular/Lymphatic: Aortic atherosclerosis. No enlarged abdominal or pelvic lymph nodes. Reproductive: Status post hysterectomy. No adnexal masses. Other: No free fluid. No abdominopelvic fluid collection. No pneumoperitoneum. No abdominal wall hernia. Musculoskeletal: There are 2 adjacent well-circumscribed fluid collections within the subcutaneous soft tissues of the right lower back measuring 8.0 x 2.8 x 7.5 cm (volume = 88 cm^3) and 4.0 x 2.1 x 3.2 cm (volume = 14 cm^3). Both of these collections have well-defined walls. There is surrounding fat stranding within the adjacent subcutaneous fat. Multiple chronic appearing bilateral rib fractures. Unchanged appearance of the proximal left femur and L2 vertebral body which may be related to Paget's disease. IMPRESSION: 1. No renal stone or hydronephrosis. 2. There are two adjacent well-circumscribed fluid collections within the subcutaneous soft tissues of the right lower back measuring  8.0 x 2.8 x 7.5 cm and 4.0 x 2.1 x 3.2 cm. There is surrounding fat stranding within the adjacent subcutaneous fat. These could represent liquefied hematomas or abscesses. 3. Colonic diverticulosis without evidence of acute diverticulitis. 4. Aortic atherosclerosis (ICD10-I70.0). Electronically Signed   By: Duanne Guess D.O.   On:  11/01/2022 19:58     Family Communication: Discussed with patient she understand and agree. All questions answereed.    Disposition: Status is: Inpatient Remains inpatient appropriate because: IV antibiotics per acute pyelonephritis.  Planned Discharge Destination: Home with Home Health     Time spent: 38 minutes  Author: Marcelino Duster, MD 11/02/2022 4:13 PM Secure chat 7am to 7pm For on call review [email or web address].

## 2022-11-02 NOTE — Plan of Care (Signed)
  Problem: Coping: Goal: Ability to adjust to condition or change in health will improve Outcome: Progressing   

## 2022-11-02 NOTE — ED Notes (Signed)
Patient medicated. Labs drawn. VSS, call button within reach. No other needs identified at this time.

## 2022-11-02 NOTE — ED Notes (Signed)
Patient cleaned of urinary incontinence, new pads, and brief placed. Patient back to resting quietly with eyes closed.

## 2022-11-02 NOTE — Evaluation (Signed)
Occupational Therapy Evaluation Patient Details Name: Brittney Ray MRN: [id number] DOB: 08-07-1950 Today's Date: 11/02/2022   History of Present Illness Brittney Ray is a 72 y.o. female with medical history significant of progressive supranuclear palsy, hypertension, hyperlipidemia, diabetes mellitus, CAD, TIA, hypothyroidism, depression with anxiety, CKD-3A, Paget bone disease, breast cancer (s/p of right lumpectomy, radiation therapy), who presents with lower back/flank pain, increased urinary frequency.   Clinical Impression   Patient received for OT evaluation. See flowsheet below for details of function. Generally, patient requiring CGA for bed mobility, CGA with RW for functional sidesteps only, and MIN-MAX A for ADLs. Patient will benefit from continued OT while in acute care.        If plan is discharge home, recommend the following: A lot of help with walking and/or transfers;A lot of help with bathing/dressing/bathroom;Assistance with cooking/housework;Direct supervision/assist for medications management;Direct supervision/assist for financial management;Assist for transportation;Help with stairs or ramp for entrance;Supervision due to cognitive status    Functional Status Assessment  Patient has had a recent decline in their functional status and demonstrates the ability to make significant improvements in function in a reasonable and predictable amount of time.  Equipment Recommendations  Other (comment) (defer to next venue of care)    Recommendations for Other Services       Precautions / Restrictions Precautions Precautions: Fall Restrictions Weight Bearing Restrictions: No      Mobility Bed Mobility Overal bed mobility: Needs Assistance Bed Mobility: Supine to Sit, Sit to Supine     Supine to sit: Min assist Sit to supine: Contact guard assist   General bed mobility comments: OT cued pt in log roll technique for t/f to EOB. Pt on stretcher  which is tall; not ideal set up for pt of 5 feet tall.    Transfers Overall transfer level: Needs assistance Equipment used: Rolling walker (2 wheels) Transfers: Sit to/from Stand Sit to Stand: Contact guard assist                  Balance Overall balance assessment: Mild deficits observed, not formally tested                                         ADL either performed or assessed with clinical judgement   ADL Overall ADL's : Needs assistance/impaired Eating/Feeding: Set up;Bed level Eating/Feeding Details (indicate cue type and reason): OT assisted with cutting food (only spoon provided by dining) and positioning tray in front of her; pt using spoon with moderate accuracy to self-feed, occasionally spilling eggs on self.   Grooming Details (indicate cue type and reason): anticipate set up from seated only           Upper Body Dressing Details (indicate cue type and reason): Pt using BIL UE functionally during session; anticipate able to perform UB Dressing with set up-MIN A   Lower Body Dressing Details (indicate cue type and reason): anticipate pt to be dependent 2/2 increased pain in back and decreased ROM of trunk flexion.   Toilet Transfer Details (indicate cue type and reason): anticipate pt to be CGA with BSC t/f; no BSC available at this time. Pt able to sidestep at EOB with CGA. Toileting- Clothing Manipulation and Hygiene: Maximal assistance Toileting - Clothing Manipulation Details (indicate cue type and reason): pt required assist for doffing soiled brief and donning new brief, rolling well at bed level with  OT managing the brief.     Functional mobility during ADLs: Contact guard assist;Rolling walker (2 wheels) (sidestepped only) General ADL Comments: Pt providing limited details about prior ADL function (just that she needs help), so unclear how close she is to functional baseline at this time. Anticipate needing at least supervision and  often hands-on assist with most ADLs at this time, especially while standing given limited activity tolerance and falls history.     Vision Patient Visual Report: No change from baseline       Perception         Praxis         Pertinent Vitals/Pain Pain Assessment Pain Assessment: 0-10 Pain Score: 10-Worst pain ever Pain Location: back Pain Descriptors / Indicators: Aching Pain Intervention(s): Limited activity within patient's tolerance, Monitored during session, Repositioned, Other (comment) (alerted RN to patient status)     Extremity/Trunk Assessment Upper Extremity Assessment Upper Extremity Assessment: Defer to OT evaluation   Lower Extremity Assessment Lower Extremity Assessment: Generalized weakness       Communication Communication Communication: No apparent difficulties   Cognition Arousal: Alert Behavior During Therapy: WFL for tasks assessed/performed Overall Cognitive Status: No family/caregiver present to determine baseline cognitive functioning                                 General Comments: Pt is oriented to person, place, situation; thinks it is November 12, 2021. Is pleasant and cooperative. Moves slowly 2/2 pain; intermittently groans 2/2 pain. Unable to describe how she falls.     General Comments  Pt on room air throughout session; O2 normal. Cooperative, but in pain during session.    Exercises     Shoulder Instructions      Home Living Family/patient expects to be discharged to:: Private residence Living Arrangements: Spouse/significant other Available Help at Discharge: Family;Available 24 hours/day Type of Home: House Home Access: Stairs to enter [employer] of Steps: 2-3 Entrance Stairs-Rails: None (on the back, where she enters) Home Layout: One level     Bathroom Shower/Tub: Walk-in shower (with a seat)         Home Equipment: Agricultural consultant (2 wheels);Shower seat   Additional Comments: Pt states  she has two sisters, one comes MWF to assist and the other comes Sat-Sun; also has an aide that comes 1x/week to assist with ADLs. Family assists with all IADLs.      Prior Functioning/Environment Prior Level of Function : Needs assist;History of Falls (last six months)       Physical Assist : Mobility (physical);ADLs (physical) Mobility (physical): Gait;Stairs ADLs (physical): IADLs;Toileting;Dressing;Bathing;Grooming Mobility Comments: Pt states she uses RW during mobility; always has someone "holding onto me" during mobility. Multiple falls (has PSP); notes most recent fall was about 10 days ago. ADLs Comments: Assistance for nearly all ADLs; showers 1x/week with aide assist; assist for standing to groom at sink, assist for toileting, assist for bathing. Does self-feed. Enjoys watching FRIENDS on TV; endorses no other hobbies.        OT Problem List: Decreased strength;Decreased activity tolerance;Impaired balance (sitting and/or standing);Decreased cognition      OT Treatment/Interventions: Self-care/ADL training;Therapeutic exercise;DME and/or AE instruction;Therapeutic activities;Patient/family education    OT Goals(Current goals can be found in the care plan section) Acute Rehab OT Goals Patient Stated Goal: Get better OT Goal Formulation: With patient Time For Goal Achievement: 11/16/22 Potential to Achieve Goals: Good ADL Goals Pt Will  Perform Grooming: with contact guard assist;standing Pt Will Transfer to Toilet: with contact guard assist;ambulating;bedside commode Pt Will Perform Toileting - Clothing Manipulation and hygiene: with min assist;sit to/from stand  OT Frequency: Min 1X/week    Co-evaluation              AM-PAC OT "6 Clicks" Daily Activity     Outcome Measure Help from another person eating meals?: A Little Help from another person taking care of personal grooming?: A Lot Help from another person toileting, which includes using toliet, bedpan, or  urinal?: A Lot Help from another person bathing (including washing, rinsing, drying)?: A Lot Help from another person to put on and taking off regular upper body clothing?: A Little Help from another person to put on and taking off regular lower body clothing?: Total 6 Click Score: 13   End of Session Equipment Utilized During Treatment: Gait belt;Rolling walker (2 wheels) Nurse Communication: Mobility status;Other (comment) (pt eating breakfast)  Activity Tolerance: Patient limited by pain Patient left: in bed;Other (comment) (RN aware of pt status; pt on stretcher in hall; no alarm or call bell available.)  OT Visit Diagnosis: Unsteadiness on feet (R26.81);Repeated falls (R29.6);Muscle weakness (generalized) (M62.81)                Time: 1610-9604 OT Time Calculation (min): 29 min Charges:  OT General Charges $OT Visit: 1 Visit OT Evaluation $OT Eval Moderate Complexity: 1 Mod OT Treatments $Self Care/Home Management : 8-22 mins  Linward Foster, MS, OTR/L  Alvester Morin 11/02/2022, 1:16 PM

## 2022-11-02 NOTE — ED Notes (Signed)
Pt eating meal tray. Family at bedside. All needs within reach.

## 2022-11-02 NOTE — ED Notes (Signed)
Patient cleaned of urinary incontinence, new brief and blue pads placed down. VSS, call button within reach. No other needs identified at this time.

## 2022-11-03 DIAGNOSIS — E039 Hypothyroidism, unspecified: Secondary | ICD-10-CM | POA: Diagnosis not present

## 2022-11-03 DIAGNOSIS — I10 Essential (primary) hypertension: Secondary | ICD-10-CM | POA: Diagnosis not present

## 2022-11-03 DIAGNOSIS — W19XXXA Unspecified fall, initial encounter: Secondary | ICD-10-CM | POA: Diagnosis not present

## 2022-11-03 DIAGNOSIS — N10 Acute tubulo-interstitial nephritis: Secondary | ICD-10-CM | POA: Diagnosis not present

## 2022-11-03 LAB — GLUCOSE, CAPILLARY
Glucose-Capillary: 107 mg/dL — ABNORMAL HIGH (ref 70–99)
Glucose-Capillary: 91 mg/dL (ref 70–99)
Glucose-Capillary: 121 mg/dL — ABNORMAL HIGH (ref 70–99)
Glucose-Capillary: 123 mg/dL — ABNORMAL HIGH (ref 70–99)

## 2022-11-03 LAB — CBC
HCT: 32.9 % — ABNORMAL LOW (ref 36.0–46.0)
Hemoglobin: 10.8 g/dL — ABNORMAL LOW (ref 12.0–15.0)
MCH: 29.5 pg (ref 26.0–34.0)
MCHC: 32.8 g/dL (ref 30.0–36.0)
MCV: 89.9 fL (ref 80.0–100.0)
Platelets: 187 10*3/uL (ref 150–400)
RBC: 3.66 MIL/uL — ABNORMAL LOW (ref 3.87–5.11)
RDW: 14 % (ref 11.5–15.5)
WBC: 5.5 10*3/uL (ref 4.0–10.5)
nRBC: 0 % (ref 0.0–0.2)

## 2022-11-03 LAB — BASIC METABOLIC PANEL
Anion gap: 8 (ref 5–15)
BUN: 37 mg/dL — ABNORMAL HIGH (ref 8–23)
CO2: 25 mmol/L (ref 22–32)
Calcium: 8.7 mg/dL — ABNORMAL LOW (ref 8.9–10.3)
Chloride: 105 mmol/L (ref 98–111)
Creatinine, Ser: 1.28 mg/dL — ABNORMAL HIGH (ref 0.44–1.00)
GFR, Estimated: 44 mL/min — ABNORMAL LOW (ref >60)
Glucose, Bld: 92 mg/dL (ref 70–99)
Potassium: 4.6 mmol/L (ref 3.5–5.1)
Sodium: 138 mmol/L (ref 135–145)

## 2022-11-03 LAB — URINE CULTURE: Culture: >=100000 — AB

## 2022-11-03 NOTE — [place] FL2 (Signed)
[place] MEDICAID FL2 LEVEL OF CARE FORM     IDENTIFICATION  Patient Name: Brittney Ray Birthdate: [birth year] Sex: female Admission Date (Current Location): 11/01/2022  [facility] and [place] Number:  Chiropodist and Address:  [facility], [address], [place], [place] [address]      Provider Number: [id number]  Attending Physician Name and Address:  Marcelino Duster, MD  Relative Name and Phone Number:  Teren, Kandler (Spouse)  [phone] (Home Phone)    Current Level of Care: Hospital Recommended Level of Care: Skilled Nursing Facility Prior Approval Number:    Date Approved/Denied:   PASRR Number:    Discharge Plan: SNF    Current Diagnoses: Patient Active Problem List   Diagnosis Date Noted   Acute pyelonephritis 11/01/2022   Type II diabetes mellitus with renal manifestations (HCC) 11/01/2022   Chronic kidney disease, stage 3a (HCC) 11/01/2022   History of progressive supranuclear palsy 11/01/2022   Fall at home, initial encounter 11/01/2022   Pressure injury of skin 08/25/2021   Syncope and collapse 08/24/2021   Normocytic anemia 08/24/2021   Chronic kidney disease (CKD), stage III (moderate) (HCC) 08/24/2021   Hypomagnesemia 08/24/2021   Anxiety and depression 08/24/2021   TIA (transient ischemic attack) 12/03/2017   Primary osteoarthritis of both hands 08/30/2017   Trochanteric bursitis of both hips 08/30/2017   DDD (degenerative disc disease), lumbar 08/30/2017   H/O Paget's disease of bone 08/30/2017   Osteopenia of neck of right femur 03/04/2017   Primary osteoarthritis of both knees 01/10/2016   Chronic pain of both knees 01/10/2016   Obesity (BMI 30-39.9) 03/25/2015   Multinodular goiter 03/04/2015   Vaccine counseling 10/24/2014   Postprocedural hypothyroidism 10/08/2014   Breast cancer in situ 08/30/2014   Coronary artery disease 08/17/2013   NSTEMI (non-ST elevated myocardial infarction)  (HCC) 08/17/2013   Acquired hypothyroidism 02/13/2013   Diabetes mellitus type 2, uncomplicated (HCC) 02/13/2013   Essential hypertension 02/13/2013   Mixed hyperlipidemia 02/13/2013   Personal history of breast cancer 02/13/2013    Orientation RESPIRATION BLADDER Height & Weight     Self, Place, Time  Normal Incontinent Weight:   Height:     BEHAVIORAL SYMPTOMS/MOOD NEUROLOGICAL BOWEL NUTRITION STATUS  Other (Comment) (n/a)  (n/a) Continent Diet (Heart  Healthy Carb Modified)  AMBULATORY STATUS COMMUNICATION OF NEEDS Skin   Limited Assist Verbally Skin abrasions, Bruising (arm and leg)                       Personal Care Assistance Level of Assistance  Bathing, Feeding, Dressing Bathing Assistance: Limited assistance Feeding assistance: Limited assistance Dressing Assistance: Limited assistance     Functional Limitations Info             SPECIAL CARE FACTORS FREQUENCY  PT (By licensed PT), OT (By licensed OT)     PT Frequency: Min 2x weekly OT Frequency: Min 2x weekly            Contractures Contractures Info: Not present    Additional Factors Info  Code Status, Allergies Code Status Info: DNR Allergies Info: Contrast Media (Iodinated Contrast Media), Codeine, Morphine And Codeine, Sulfa Antibiotics           Current Medications (11/03/2022):  This is the current hospital active medication list Current Facility-Administered Medications  Medication Dose Route Frequency Provider Last Rate Last Admin   acetaminophen (TYLENOL) tablet 650 mg  650 mg Oral Q6H PRN Lorretta Harp, MD  amLODipine (NORVASC) tablet 5 mg  5 mg Oral Daily Lorretta Harp, MD   5 mg at 11/03/22 1050   atorvastatin (LIPITOR) tablet 40 mg  40 mg Oral QHS Lorretta Harp, MD   40 mg at 11/02/22 2133   busPIRone (BUSPAR) tablet 5 mg  5 mg Oral BID Lorretta Harp, MD   5 mg at 11/03/22 1050   carvedilol (COREG) tablet 3.125 mg  3.125 mg Oral BID WC Lorretta Harp, MD   3.125 mg at 11/03/22 1000    cefTRIAXone (ROCEPHIN) 1 g in sodium chloride 0.9 % 100 mL IVPB  1 g Intravenous Q24H Lorretta Harp, MD 200 mL/hr at 11/02/22 1753 1 g at 11/02/22 1753   escitalopram (LEXAPRO) tablet 10 mg  10 mg Oral Daily Lorretta Harp, MD   10 mg at 11/03/22 1051   fenofibrate tablet 160 mg  160 mg Oral Daily Lorretta Harp, MD   160 mg at 11/03/22 1129   fentaNYL (SUBLIMAZE) injection 12.5 mcg  12.5 mcg Intravenous Q3H PRN Lorretta Harp, MD       hydrALAZINE (APRESOLINE) injection 5 mg  5 mg Intravenous Q2H PRN Lorretta Harp, MD       lisinopril (ZESTRIL) tablet 20 mg  20 mg Oral Daily Otelia Sergeant, RPH   20 mg at 11/03/22 1051   And   hydrochlorothiazide (HYDRODIURIL) tablet 12.5 mg  12.5 mg Oral Daily Otelia Sergeant, RPH   12.5 mg at 11/03/22 1051   insulin aspart (novoLOG) injection 0-5 Units  0-5 Units Subcutaneous QHS Lorretta Harp, MD       insulin aspart (novoLOG) injection 0-9 Units  0-9 Units Subcutaneous TID WC Lorretta Harp, MD       levothyroxine (SYNTHROID) tablet 25 mcg  25 mcg Oral Q0600 Lorretta Harp, MD   25 mcg at 11/03/22 0549   lidocaine (LIDODERM) 5 % 1 patch  1 patch Transdermal Q24H Chesley Noon, MD   1 patch at 11/02/22 1652   ondansetron (ZOFRAN) injection 4 mg  4 mg Intravenous Q8H PRN Lorretta Harp, MD       oxyCODONE-acetaminophen (PERCOCET/ROXICET) 5-325 MG per tablet 1 tablet  1 tablet Oral Q4H PRN Lorretta Harp, MD   1 tablet at 11/03/22 1051   pantoprazole (PROTONIX) EC tablet 40 mg  40 mg Oral Daily Lorretta Harp, MD   40 mg at 11/03/22 1049     Discharge Medications: Please see discharge summary for a list of discharge medications.  Relevant Imaging Results:  Relevant Lab Results:   Additional Information SS#: [id number]  Truddie Hidden, RN

## 2022-11-03 NOTE — Plan of Care (Signed)
  Problem: Education: Goal: Ability to describe self-care measures that may prevent or decrease complications (Diabetes Survival Skills Education) will improve Outcome: Progressing Goal: Individualized Educational Video(s) Outcome: Progressing   Problem: Coping: Goal: Ability to adjust to condition or change in health will improve Outcome: Progressing   Problem: Fluid Volume: Goal: Ability to maintain a balanced intake and output will improve Outcome: Progressing   Problem: Health Behavior/Discharge Planning: Goal: Ability to identify and utilize available resources and services will improve Outcome: Progressing Goal: Ability to manage health-related needs will improve Outcome: Progressing   Problem: Metabolic: Goal: Ability to maintain appropriate glucose levels will improve Outcome: Progressing   Problem: Nutritional: Goal: Maintenance of adequate nutrition will improve Outcome: Progressing Goal: Progress toward achieving an optimal weight will improve Outcome: Progressing   Problem: Skin Integrity: Goal: Risk for impaired skin integrity will decrease Outcome: Progressing   Problem: Education: Goal: Knowledge of General Education information will improve Description: Including pain rating scale, medication(s)/side effects and non-pharmacologic comfort measures Outcome: Progressing   Problem: Health Behavior/Discharge Planning: Goal: Ability to manage health-related needs will improve Outcome: Progressing   Problem: Clinical Measurements: Goal: Ability to maintain clinical measurements within normal limits will improve Outcome: Progressing Goal: Will remain free from infection Outcome: Progressing Goal: Diagnostic test results will improve Outcome: Progressing Goal: Respiratory complications will improve Outcome: Progressing Goal: Cardiovascular complication will be avoided Outcome: Progressing   Problem: Activity: Goal: Risk for activity intolerance will  decrease Outcome: Progressing   Problem: Nutrition: Goal: Adequate nutrition will be maintained Outcome: Progressing   Problem: Coping: Goal: Level of anxiety will decrease Outcome: Progressing   Problem: Elimination: Goal: Will not experience complications related to bowel motility Outcome: Progressing Goal: Will not experience complications related to urinary retention Outcome: Progressing   Problem: Pain Managment: Goal: General experience of comfort will improve Outcome: Progressing   Problem: Safety: Goal: Ability to remain free from injury will improve Outcome: Progressing

## 2022-11-03 NOTE — Progress Notes (Signed)
Progress Note   Patient: Brittney Ray WJX:[id number] DOB: [birth year] DOA: 11/01/2022     2 DOS: the patient was seen and examined on 11/03/2022   Brief hospital course: Brittney Ray is a 72 y.o. female with medical history significant of progressive supranuclear palsy, hypertension, hyperlipidemia, diabetes mellitus, CAD, TIA, hypothyroidism, depression with anxiety, CKD-3A, Paget bone disease, breast cancer (s/p of right lumpectomy, radiation therapy), who presents with lower back/flank pain, increased urinary frequency.   Assessment and Plan: Acute pyelonephritis. Continue IV Rocephin therapy. Urine cultures grew E.coli pan sensitive. Also her flank pain likely due to hematomas.  Possible bilateral hematomas. Most likely due to multiple falls. H&H remained stable. Continue pain control. Fall precautions.  Multiple falls. Patient does have history of supranuclear palsy. Continue fall precautions. PT/ OT follow up TOC working on SNF placement. FL2 signed.  Type 2 diabetes mellitus. Accu-Cheks, sliding scale insulin as per floor protocol. Hold oral hypoglycemics.  Chronic kidney disease stage III a. Renal function at baseline. Continue to follow daily renal function. Avoid nephrotoxic drugs.  Essential hypertension Continue Coreg, amlodipine therapy.  Mixed hyperlipidemia. TIA. Continue Lipitor, fenofibrate therapy. Hold aspirin due to hematomas.  CAD. Continue Lipitor, hold aspirin  Anxiety and depression. Continue current home medications.  Out of bed to chair. Incentive spirometry. Nursing supportive care. Fall, aspiration precautions. DVT prophylaxis   Code Status: Limited: Do not attempt resuscitation (DNR) -DNR-LIMITED -Do Not Intubate/DNI   Subjective: Patient is seen and examined today morning. She has pain over bilateral flank area. Eating good, no nausea or abdominal pain.   Physical Exam: Vitals:   11/02/22 2004 11/03/22 0405  11/03/22 0815 11/03/22 1534  BP: (!) 135/59 (!) 132/57 (!) 130/56 (!) 102/45  Pulse: 80 (!) 58 65 67  Resp: 18 18 16    Temp: 98.5 F (36.9 C) 98 F (36.7 C) 97.9 F (36.6 C) 97.6 F (36.4 C)  TempSrc: Oral Oral Oral   SpO2: 98% 98% 100% 97%    General - Elderly Caucasian female,no acute distress HEENT - PERRLA, EOMI, atraumatic head, non tender sinuses. Lung - Clear, bibasilar rales noted Heart - S1, S2 heard, no murmurs, rubs, trace pedal edema. Abdomen - Soft, non tender right flank tenderness, bowel sounds  Neuro - Alert, awake and oriented x 3, non focal exam. Skin - Warm and dry.  Data Reviewed:      Latest Ref Rng & Units 11/03/2022    8:17 AM 11/02/2022    5:54 AM 11/01/2022    2:53 PM  CBC  WBC 4.0 - 10.5 K/uL 5.5  7.1  5.1   Hemoglobin 12.0 - 15.0 g/dL 21.3  08.6  57.8   Hematocrit 36.0 - 46.0 % 32.9  33.6  34.4   Platelets 150 - 400 K/uL 187  195  211       Latest Ref Rng & Units 11/03/2022    6:26 AM 11/02/2022    5:54 AM 11/01/2022    2:53 PM  BMP  Glucose 70 - 99 mg/dL 92  469  629   BUN 8 - 23 mg/dL 37  31  37   Creatinine 0.44 - 1.00 mg/dL 5.28  4.13  2.44   Sodium 135 - 145 mmol/L 138  138  138   Potassium 3.5 - 5.1 mmol/L 4.6  3.8  3.9   Chloride 98 - 111 mmol/L 105  105  107   CO2 22 - 32 mmol/L 25  25  23    Calcium 8.9 -  10.3 mg/dL 8.7  9.0  9.2    No results found.   Family Communication: Discussed with patient she understand and agree. All questions answereed.    Disposition: Status is: Inpatient Remains inpatient appropriate because: IV antibiotics for acute pyelonephritis.  Planned Discharge Destination: Skilled nursing facility     Time spent: 37 minutes  Author: Marcelino Duster, MD 11/03/2022 7:32 PM Secure chat 7am to 7pm For on call review [email or web address].

## 2022-11-04 DIAGNOSIS — I10 Essential (primary) hypertension: Secondary | ICD-10-CM | POA: Diagnosis not present

## 2022-11-04 DIAGNOSIS — W19XXXA Unspecified fall, initial encounter: Secondary | ICD-10-CM | POA: Diagnosis not present

## 2022-11-04 DIAGNOSIS — N10 Acute tubulo-interstitial nephritis: Secondary | ICD-10-CM | POA: Diagnosis not present

## 2022-11-04 DIAGNOSIS — E039 Hypothyroidism, unspecified: Secondary | ICD-10-CM | POA: Diagnosis not present

## 2022-11-04 LAB — GLUCOSE, CAPILLARY
Glucose-Capillary: 98 mg/dL (ref 70–99)
Glucose-Capillary: 103 mg/dL — ABNORMAL HIGH (ref 70–99)
Glucose-Capillary: 100 mg/dL — ABNORMAL HIGH (ref 70–99)
Glucose-Capillary: 116 mg/dL — ABNORMAL HIGH (ref 70–99)

## 2022-11-04 MED ORDER — CEFAZOLIN SODIUM-DEXTROSE 2-4 GM/100ML-% IV SOLN
2.0000 g | Freq: Three times a day (TID) | INTRAVENOUS | Status: DC
  Administered 2022-11-04 – 2022-11-05 (×3): 2 g via INTRAVENOUS
  Filled 2022-11-04 (×5): qty 100

## 2022-11-04 NOTE — Plan of Care (Signed)
  Problem: Education: Goal: Ability to describe self-care measures that may prevent or decrease complications (Diabetes Survival Skills Education) will improve Outcome: Progressing Goal: Individualized Educational Video(s) Outcome: Progressing   Problem: Coping: Goal: Ability to adjust to condition or change in health will improve Outcome: Progressing   Problem: Fluid Volume: Goal: Ability to maintain a balanced intake and output will improve Outcome: Progressing   Problem: Health Behavior/Discharge Planning: Goal: Ability to identify and utilize available resources and services will improve Outcome: Progressing Goal: Ability to manage health-related needs will improve Outcome: Progressing   Problem: Tissue Perfusion: Goal: Adequacy of tissue perfusion will improve Outcome: Progressing   Problem: Education: Goal: Knowledge of General Education information will improve Description: Including pain rating scale, medication(s)/side effects and non-pharmacologic comfort measures Outcome: Progressing

## 2022-11-04 NOTE — Progress Notes (Signed)
Physical Therapy Treatment Patient Details Name: ZORIA TUTTLE MRN: [id number] DOB: August 24, 1950 Today's Date: 11/04/2022   History of Present Illness LAJUAN FRABLE is a 72 y.o. female with medical history significant of progressive supranuclear palsy, hypertension, hyperlipidemia, diabetes mellitus, CAD, TIA, hypothyroidism, depression with anxiety, CKD-3A, Paget bone disease, breast cancer (s/p of right lumpectomy, radiation therapy), who presents with lower back/flank pain, increased urinary frequency.    PT Comments  Pt was pleasant and motivated to participate during the session and put forth good effort throughout. Pt is currently Min A for bed mobility, transfers from lowered surfaces, and during amb. Pt able to perform multiple sit<>supine and STS's during session. Once standing, she was able to perform 2x 20 feet of amb with BRW, needing Min A for BRW management, likely less assist when appropriate RW utilized. She takes steps with slightly forward leaning posture with a variable cadence. Pt will benefit from continued PT services upon discharge to safely address deficits listed in patient problem list for decreased caregiver assistance and eventual return to PLOF.    If plan is discharge home, recommend the following: A little help with walking and/or transfers;A little help with bathing/dressing/bathroom;Assistance with cooking/housework;Direct supervision/assist for financial management;Direct supervision/assist for medications management;Assist for transportation;Help with stairs or ramp for entrance;Supervision due to cognitive status   Can travel by private vehicle     Yes  Equipment Recommendations  Rolling walker (2 wheels)    Recommendations for Other Services       Precautions / Restrictions Precautions Precautions: Fall Restrictions Weight Bearing Restrictions: No     Mobility  Bed Mobility Overal bed mobility: Needs Assistance       Supine to sit:  Min assist Sit to supine: Min assist   General bed mobility comments: light cues for log roll, perform multiple sit<>supine throughout session, needing light min A to sit up completely and for slight adjustments in bed    Transfers Overall transfer level: Needs assistance Equipment used: Rolling walker (2 wheels) Transfers: Sit to/from Stand Sit to Stand: Min assist           General transfer comment: light min A from variable lowered surfaces. Only needs initial boost, once standing pt is CGA.    Ambulation/Gait Ambulation/Gait assistance: Min assist Gait Distance (Feet): 20 Feet Assistive device: Rolling walker (2 wheels) Gait Pattern/deviations: Narrow base of support, Trunk flexed Gait velocity: decreased     General Gait Details: Only having BRW in room to amb with. Pt needing Min a for BRW directing and management. Pt may only need CGA with regular RW, but walking with variable cadence and slight forwards lean into walker   Stairs             Wheelchair Mobility     Tilt Bed    Modified Rankin (Stroke Patients Only)       Balance Overall balance assessment: Needs assistance Sitting-balance support: No upper extremity supported, Feet unsupported Sitting balance-Leahy Scale: Fair     Standing balance support: Bilateral upper extremity supported, Reliant on assistive device for balance, During functional activity Standing balance-Leahy Scale: Poor Standing balance comment: reliant on RW,                            Cognition Arousal: Alert Behavior During Therapy: WFL for tasks assessed/performed Overall Cognitive Status: No family/caregiver present to determine baseline cognitive functioning  General Comments: answers questions with slightly delayed responses        Exercises      General Comments        Pertinent Vitals/Pain Pain Assessment Pain Assessment: 0-10 Pain Score: 7   Pain Location: back Pain Descriptors / Indicators: Aching, Guarding Pain Intervention(s): Monitored during session, Limited activity within patient's tolerance, Repositioned    Home Living                          Prior Function            PT Goals (current goals can now be found in the care plan section) Progress towards PT goals: Progressing toward goals    Frequency    Min 1X/week      PT Plan      Co-evaluation              AM-PAC PT "6 Clicks" Mobility   Outcome Measure  Help needed turning from your back to your side while in a flat bed without using bedrails?: A Little Help needed moving from lying on your back to sitting on the side of a flat bed without using bedrails?: A Little Help needed moving to and from a bed to a chair (including a wheelchair)?: A Little Help needed standing up from a chair using your arms (e.g., wheelchair or bedside chair)?: A Little Help needed to walk in hospital room?: A Little Help needed climbing 3-5 steps with a railing? : A Lot 6 Click Score: 17    End of Session Equipment Utilized During Treatment: Gait belt Activity Tolerance: Patient tolerated treatment well Patient left: in bed;with call bell/phone within reach;with SCD's reapplied;with bed alarm set Nurse Communication: Mobility status PT Visit Diagnosis: Unsteadiness on feet (R26.81);Muscle weakness (generalized) (M62.81);History of falling (Z91.81)     Time: 1610-9604 PT Time Calculation (min) (ACUTE ONLY): 40 min  Charges:                            Cecile Sheerer, SPT 11/04/22, 1:39 PM

## 2022-11-04 NOTE — Plan of Care (Signed)
  Problem: Education: Goal: Ability to describe self-care measures that may prevent or decrease complications (Diabetes Survival Skills Education) will improve Outcome: Progressing Goal: Individualized Educational Video(s) Outcome: Progressing   Problem: Coping: Goal: Ability to adjust to condition or change in health will improve Outcome: Progressing   Problem: Fluid Volume: Goal: Ability to maintain a balanced intake and output will improve Outcome: Progressing   Problem: Health Behavior/Discharge Planning: Goal: Ability to identify and utilize available resources and services will improve Outcome: Progressing Goal: Ability to manage health-related needs will improve Outcome: Progressing   Problem: Metabolic: Goal: Ability to maintain appropriate glucose levels will improve Outcome: Progressing   Problem: Nutritional: Goal: Maintenance of adequate nutrition will improve Outcome: Progressing Goal: Progress toward achieving an optimal weight will improve Outcome: Progressing   Problem: Skin Integrity: Goal: Risk for impaired skin integrity will decrease Outcome: Progressing   Problem: Tissue Perfusion: Goal: Adequacy of tissue perfusion will improve Outcome: Progressing   Problem: Education: Goal: Knowledge of General Education information will improve Description: Including pain rating scale, medication(s)/side effects and non-pharmacologic comfort measures Outcome: Progressing   Problem: Health Behavior/Discharge Planning: Goal: Ability to manage health-related needs will improve Outcome: Progressing   Problem: Clinical Measurements: Goal: Ability to maintain clinical measurements within normal limits will improve Outcome: Progressing Goal: Will remain free from infection Outcome: Progressing Goal: Diagnostic test results will improve Outcome: Progressing Goal: Respiratory complications will improve Outcome: Progressing Goal: Cardiovascular complication will  be avoided Outcome: Progressing   Problem: Activity: Goal: Risk for activity intolerance will decrease Outcome: Progressing   Problem: Nutrition: Goal: Adequate nutrition will be maintained Outcome: Progressing   Problem: Coping: Goal: Level of anxiety will decrease Outcome: Progressing   Problem: Elimination: Goal: Will not experience complications related to bowel motility Outcome: Progressing Goal: Will not experience complications related to urinary retention Outcome: Progressing   Problem: Pain Managment: Goal: General experience of comfort will improve Outcome: Progressing   Problem: Safety: Goal: Ability to remain free from injury will improve Outcome: Progressing   

## 2022-11-04 NOTE — Care Management Important Message (Signed)
Important Message  Patient Details  Name: Brittney Ray MRN: [id number] Date of Birth: 08/24/1950   Important Message Given:  Yes - Medicare IM     Truddie Hidden, RN 11/04/2022, 3:25 PM

## 2022-11-04 NOTE — Progress Notes (Signed)
Progress Note   Patient: Brittney Ray XBJ:[id number] DOB: 1950-12-24 DOA: 11/01/2022     3 DOS: the patient was seen and examined on 11/04/2022   Brief hospital course: Brittney Ray is a 72 y.o. female with medical history significant of progressive supranuclear palsy, hypertension, hyperlipidemia, diabetes mellitus, CAD, TIA, hypothyroidism, depression with anxiety, CKD-3A, Paget bone disease, breast cancer (s/p of right lumpectomy, radiation therapy), who presents with lower back/flank pain, increased urinary frequency.   10/23: TOC working on placement, Abx changed to IV Ancef  Assessment and Plan: Acute pyelonephritis. Urine cultures grew E.coli pan sensitive.- Abx changed to IV Ancef her flank pain likely due to hematomas.  Possible bilateral hematomas. Most likely due to multiple falls. H&H remained stable. Continue pain control. Fall precautions.  Multiple falls. Patient does have history of supranuclear palsy. Continue fall precautions. PT/ OT recommends SNF TOC working on SNF placement.   Type 2 diabetes mellitus. Accu-Cheks, sliding scale insulin for now. Hold oral hypoglycemics.  Chronic kidney disease stage III a. Renal function at baseline. Continue to follow daily renal function. Avoid nephrotoxic drugs. Lab Results  Component Value Date   CREATININE 1.28 (H) 11/03/2022   CREATININE 1.15 (H) 11/02/2022   CREATININE 1.15 (H) 11/01/2022    Essential hypertension Continue Coreg, amlodipine therapy.  Mixed hyperlipidemia. TIA. Continue Lipitor, fenofibrate therapy. Hold aspirin due to hematomas.  CAD. Continue Lipitor, hold aspirin  Anxiety and depression. Continue current home medications.     Code Status: Limited: Do not attempt resuscitation (DNR) -DNR-LIMITED -Do Not Intubate/DNI   Subjective: Pminimal pain over bilateral flank area. Eating good, no nausea or abdominal pain.   Physical Exam: Vitals:   11/03/22 2042 11/04/22  0405 11/04/22 0805 11/04/22 1619  BP: (!) 115/43 (!) 130/55 (!) 140/55 (!) 117/51  Pulse: 72 (!) 59 65 64  Resp: 18 18 20 18   Temp: 98.3 F (36.8 C) 98.6 F (37 C) (!) 97.5 F (36.4 C) 98.3 F (36.8 C)  TempSrc: Oral Oral    SpO2: 97% 99% 97% 99%    General - Elderly Caucasian female,no acute distress HEENT - PERRLA, EOMI, atraumatic head, non tender sinuses. Lung - Clear, bibasilar rales noted Heart - S1, S2 heard, no murmurs, rubs, trace pedal edema. Abdomen - Soft, non tender right flank tenderness, bowel sounds  Neuro - Alert, awake and oriented x 3, non focal exam. Skin - Warm and dry.  Data Reviewed:      Latest Ref Rng & Units 11/03/2022    8:17 AM 11/02/2022    5:54 AM 11/01/2022    2:53 PM  CBC  WBC 4.0 - 10.5 K/uL 5.5  7.1  5.1   Hemoglobin 12.0 - 15.0 g/dL 30.8  65.7  84.6   Hematocrit 36.0 - 46.0 % 32.9  33.6  34.4   Platelets 150 - 400 K/uL 187  195  211       Latest Ref Rng & Units 11/03/2022    6:26 AM 11/02/2022    5:54 AM 11/01/2022    2:53 PM  BMP  Glucose 70 - 99 mg/dL 92  962  952   BUN 8 - 23 mg/dL 37  31  37   Creatinine 0.44 - 1.00 mg/dL 8.41  3.24  4.01   Sodium 135 - 145 mmol/L 138  138  138   Potassium 3.5 - 5.1 mmol/L 4.6  3.8  3.9   Chloride 98 - 111 mmol/L 105  105  107   CO2  22 - 32 mmol/L 25  25  23    Calcium 8.9 - 10.3 mg/dL 8.7  9.0  9.2    No results found.   Family Communication: Discussed with patient she understand and agree. All questions answereed. Son updated over phone    Disposition: Status is: Inpatient Remains inpatient appropriate because: IV antibiotics for acute pyelonephritis. Patient is medically ready for D/C when placement available  Planned Discharge Destination: Skilled nursing facility     Time spent: 35 minutes  Author: Delfino Lovett, MD 11/04/2022 6:20 PM Secure chat 7am to 7pm For on call review [email or web address].

## 2022-11-04 NOTE — Progress Notes (Signed)
Occupational Therapy Treatment Patient Details Name: Brittney Ray MRN: [id number] DOB: 10/08/1950 Today's Date: 11/04/2022   History of present illness Brittney Ray is a 72 y.o. female with medical history significant of progressive supranuclear palsy, hypertension, hyperlipidemia, diabetes mellitus, CAD, TIA, hypothyroidism, depression with anxiety, CKD-3A, Paget bone disease, breast cancer (s/p of right lumpectomy, radiation therapy), who presents with lower back/flank pain, increased urinary frequency.   OT comments  Pt is supine in bed on arrival. Pleasant and agreeable to OT session. She has minimal back pain. Demo bed mobility to EOB with CGA with extra time to scoot to EOB. STS from EOB to RW with CGA for safety, attempted ~5 feet of mobility then pt reported feeling "very dizzy." Opted to have pt sit in recliner at this time and BP checked, was 126/58. Once seated, dizziness subsided. Obtained mesh panties and pt required Max A for LB dressing tasks via STS. Family in room and pt left in recliner with all needs in place and will cont to require skilled acute OT services to maximize her safety and IND to promote return to PLOF.       If plan is discharge home, recommend the following:  A lot of help with walking and/or transfers;A lot of help with bathing/dressing/bathroom;Assistance with cooking/housework;Direct supervision/assist for medications management;Direct supervision/assist for financial management;Assist for transportation;Help with stairs or ramp for entrance;Supervision due to cognitive status   Equipment Recommendations  Other (comment) (defer to next venue)    Recommendations for Other Services      Precautions / Restrictions Precautions Precautions: Fall Restrictions Weight Bearing Restrictions: No       Mobility Bed Mobility Overal bed mobility: Needs Assistance Bed Mobility: Supine to Sit, Sit to Supine     Supine to sit: Contact guard, Used  rails, HOB elevated          Transfers Overall transfer level: Needs assistance Equipment used: Rolling walker (2 wheels) Transfers: Sit to/from Stand Sit to Stand: Min assist, Contact guard assist           General transfer comment: Min to CGA from EOB to RW for safety and from recliner for LB dressin tasks, unable to ambulate more than 5 feet d/t c/o dizziness     Balance Overall balance assessment: Needs assistance Sitting-balance support: No upper extremity supported, Feet unsupported       Standing balance support: Bilateral upper extremity supported, Reliant on assistive device for balance, During functional activity Standing balance-Leahy Scale: Poor Standing balance comment: reliant on RW,                           ADL either performed or assessed with clinical judgement   ADL Overall ADL's : Needs assistance/impaired Eating/Feeding: Set up;Sitting                   Lower Body Dressing: Maximal assistance Lower Body Dressing Details (indicate cue type and reason): Max A to don mesh panties although pt attempts to assist Toilet Transfer: Contact guard assist Toilet Transfer Details (indicate cue type and reason): simulated from bed to recliner with CGA           General ADL Comments: pt dizzy with standing therefore unable to ambluate to sink in room    Extremity/Trunk Assessment Upper Extremity Assessment Upper Extremity Assessment: Generalized weakness   Lower Extremity Assessment Lower Extremity Assessment: Generalized weakness        Vision  Perception     Praxis      Cognition Arousal: Alert Behavior During Therapy: WFL for tasks assessed/performed Overall Cognitive Status: No family/caregiver present to determine baseline cognitive functioning                                 General Comments: answers questions with slightly delayed responses        Exercises      Shoulder Instructions        General Comments      Pertinent Vitals/ Pain       Pain Assessment Pain Assessment: Faces Faces Pain Scale: Hurts a little bit Pain Descriptors / Indicators: Aching, Guarding Pain Intervention(s): Monitored during session  Home Living                                          Prior Functioning/Environment              Frequency  Min 1X/week        Progress Toward Goals  OT Goals(current goals can now be found in the care plan section)  Progress towards OT goals: Progressing toward goals  Acute Rehab OT Goals Patient Stated Goal: improve strength OT Goal Formulation: With patient Time For Goal Achievement: 11/16/22 Potential to Achieve Goals: Good  Plan      Co-evaluation                 AM-PAC OT "6 Clicks" Daily Activity     Outcome Measure   Help from another person eating meals?: A Little Help from another person taking care of personal grooming?: A Lot Help from another person toileting, which includes using toliet, bedpan, or urinal?: A Lot Help from another person bathing (including washing, rinsing, drying)?: A Lot   Help from another person to put on and taking off regular lower body clothing?: A Lot 6 Click Score: 11    End of Session Equipment Utilized During Treatment: Gait belt;Rolling walker (2 wheels)  OT Visit Diagnosis: Unsteadiness on feet (R26.81);Repeated falls (R29.6);Muscle weakness (generalized) (M62.81)   Activity Tolerance Patient tolerated treatment well (limited by reported dizziness)   Patient Left in chair;with call bell/phone within reach;with chair alarm set;with family/visitor present   Nurse Communication Mobility status;Other (comment)        Time: 4782-9562 OT Time Calculation (min): 35 min  Charges: OT General Charges $OT Visit: 1 Visit OT Treatments $Self Care/Home Management : 8-22 mins $Therapeutic Activity: 8-22 mins  Elmer Merwin, OTR/L  11/04/22, 3:57 PM  Nysha Koplin E  Aziza Stuckert 11/04/2022, 3:54 PM

## 2022-11-04 NOTE — Progress Notes (Signed)
RE: Brittney Ray   Date of Birth: [birth year] Date: 11/04/2022      To Whom It May Concern:   Please be advised that the above-named patient will require a short-term nursing home stay - anticipated 30 days or less for rehabilitation and strengthening.  The plan is for return home.

## 2022-11-05 DIAGNOSIS — Z8669 Personal history of other diseases of the nervous system and sense organs: Secondary | ICD-10-CM | POA: Diagnosis not present

## 2022-11-05 DIAGNOSIS — N10 Acute tubulo-interstitial nephritis: Secondary | ICD-10-CM | POA: Diagnosis not present

## 2022-11-05 DIAGNOSIS — N12 Tubulo-interstitial nephritis, not specified as acute or chronic: Secondary | ICD-10-CM

## 2022-11-05 DIAGNOSIS — W19XXXA Unspecified fall, initial encounter: Secondary | ICD-10-CM | POA: Diagnosis not present

## 2022-11-05 LAB — BASIC METABOLIC PANEL
Anion gap: 5 (ref 5–15)
BUN: 44 mg/dL — ABNORMAL HIGH (ref 8–23)
CO2: 27 mmol/L (ref 22–32)
Calcium: 8.7 mg/dL — ABNORMAL LOW (ref 8.9–10.3)
Chloride: 106 mmol/L (ref 98–111)
Creatinine, Ser: 1.32 mg/dL — ABNORMAL HIGH (ref 0.44–1.00)
GFR, Estimated: 43 mL/min — ABNORMAL LOW (ref >60)
Glucose, Bld: 107 mg/dL — ABNORMAL HIGH (ref 70–99)
Potassium: 4.1 mmol/L (ref 3.5–5.1)
Sodium: 138 mmol/L (ref 135–145)

## 2022-11-05 LAB — CBC
HCT: 30.6 % — ABNORMAL LOW (ref 36.0–46.0)
Hemoglobin: 10.1 g/dL — ABNORMAL LOW (ref 12.0–15.0)
MCH: 29.6 pg (ref 26.0–34.0)
MCHC: 33 g/dL (ref 30.0–36.0)
MCV: 89.7 fL (ref 80.0–100.0)
Platelets: 167 10*3/uL (ref 150–400)
RBC: 3.41 MIL/uL — ABNORMAL LOW (ref 3.87–5.11)
RDW: 14 % (ref 11.5–15.5)
WBC: 4.3 10*3/uL (ref 4.0–10.5)
nRBC: 0 % (ref 0.0–0.2)

## 2022-11-05 LAB — GLUCOSE, CAPILLARY
Glucose-Capillary: 97 mg/dL (ref 70–99)
Glucose-Capillary: 106 mg/dL — ABNORMAL HIGH (ref 70–99)

## 2022-11-05 NOTE — TOC Transition Note (Signed)
Transition of Care [facility]) - CM/SW Discharge Note   Patient Details  Name: Brittney Ray MRN: [id number] Date of Birth: 17-Mar-1950  [facility]) CM/SW Contact:  Truddie Hidden, RN Phone Number: 11/05/2022, 10:38 AM   Clinical Narrative:    Spoke with patient's spouse to advised patient has HH arranged via [facility]. He only wants PT. He has refused all other skilled services. Patient's spouse has also refused palliative care. MD notified. Her sister will transport her home  TOC signing off.     Final next level of care: Home w Home Health Services Barriers to Discharge: Barriers Resolved   Patient Goals and CMS Choice      Discharge Placement                         Discharge Plan and Services Additional resources added to the After Visit Summary for                            [facility] Arranged: PT HH Agency: [facility] Date [facility] Agency Contacted: 11/05/22 Time HH Agency Contacted: 1012 Representative spoke with at [facility] Agency: Jerilynn Som  Social Determinants of Health (SDOH) Interventions SDOH Screenings   Food Insecurity: No Food Insecurity (11/02/2022)  Housing: Low Risk  (11/02/2022)  Transportation Needs: No Transportation Needs (11/02/2022)  Utilities: Not At Risk (11/02/2022)  Tobacco Use: Low Risk  (11/02/2022)     Readmission Risk Interventions     No data to display

## 2022-11-05 NOTE — TOC Progression Note (Addendum)
Transition of Care [facility]) - Progression Note    Patient Details  Name: Brittney Ray MRN: [id number] Date of Birth: [birth year]  [facility]) CM/SW Contact  Truddie Hidden, RN Phone Number: 11/05/2022, 9:11 AM  Clinical Narrative:    Referral for [facility] sent and declined by [place] from [place]. Unable to staff [facility] at this time.  Referral sent and to [facility] from [facility]  Referral sent and accepted by Jerilynn Som at [facility].   Attempt to reach patient's spouse. No answer. Left a message.   TOC signing off.              Expected Discharge Plan and Services                                               Social Determinants of Health (SDOH) Interventions SDOH Screenings   Food Insecurity: No Food Insecurity (11/02/2022)  Housing: Low Risk  (11/02/2022)  Transportation Needs: No Transportation Needs (11/02/2022)  Utilities: Not At Risk (11/02/2022)  Tobacco Use: Low Risk  (11/02/2022)    Readmission Risk Interventions     No data to display

## 2022-11-05 NOTE — Plan of Care (Signed)
  Problem: Education: Goal: Ability to describe self-care measures that may prevent or decrease complications (Diabetes Survival Skills Education) will improve Outcome: Adequate for Discharge Goal: Individualized Educational Video(s) Outcome: Adequate for Discharge   Problem: Coping: Goal: Ability to adjust to condition or change in health will improve Outcome: Adequate for Discharge   Problem: Fluid Volume: Goal: Ability to maintain a balanced intake and output will improve Outcome: Adequate for Discharge   Problem: Health Behavior/Discharge Planning: Goal: Ability to identify and utilize available resources and services will improve Outcome: Adequate for Discharge Goal: Ability to manage health-related needs will improve Outcome: Adequate for Discharge   Problem: Metabolic: Goal: Ability to maintain appropriate glucose levels will improve Outcome: Adequate for Discharge   Problem: Nutritional: Goal: Maintenance of adequate nutrition will improve Outcome: Adequate for Discharge Goal: Progress toward achieving an optimal weight will improve Outcome: Adequate for Discharge   Problem: Skin Integrity: Goal: Risk for impaired skin integrity will decrease Outcome: Adequate for Discharge   Problem: Tissue Perfusion: Goal: Adequacy of tissue perfusion will improve Outcome: Adequate for Discharge   Problem: Education: Goal: Knowledge of General Education information will improve Description: Including pain rating scale, medication(s)/side effects and non-pharmacologic comfort measures Outcome: Adequate for Discharge   Problem: Health Behavior/Discharge Planning: Goal: Ability to manage health-related needs will improve Outcome: Adequate for Discharge   Problem: Clinical Measurements: Goal: Ability to maintain clinical measurements within normal limits will improve Outcome: Adequate for Discharge Goal: Will remain free from infection Outcome: Adequate for Discharge Goal:  Diagnostic test results will improve Outcome: Adequate for Discharge Goal: Respiratory complications will improve Outcome: Adequate for Discharge Goal: Cardiovascular complication will be avoided Outcome: Adequate for Discharge   Problem: Activity: Goal: Risk for activity intolerance will decrease Outcome: Adequate for Discharge   Problem: Nutrition: Goal: Adequate nutrition will be maintained Outcome: Adequate for Discharge   Problem: Coping: Goal: Level of anxiety will decrease Outcome: Adequate for Discharge   Problem: Elimination: Goal: Will not experience complications related to bowel motility Outcome: Adequate for Discharge Goal: Will not experience complications related to urinary retention Outcome: Adequate for Discharge   Problem: Pain Managment: Goal: General experience of comfort will improve Outcome: Adequate for Discharge   Problem: Safety: Goal: Ability to remain free from injury will improve Outcome: Adequate for Discharge   Problem: Skin Integrity: Goal: Risk for impaired skin integrity will decrease Outcome: Adequate for Discharge   Pt ao x3-4. Pt family has all belongings and DC paperwork. Pt being taken home by family. Pt taken down to lobby with staff via wheelchair

## 2022-11-05 NOTE — TOC Progression Note (Addendum)
Transition of Care [facility]) - Progression Note    Patient Details  Name: DIVINE MALOOF MRN: [id number] Date of Birth: [birth year]  Transition of Care [facility]) CM/SW Contact  Truddie Hidden, RN Phone Number: 11/05/2022, 9:06 AM  Clinical Narrative:    Spoke with patient's spouse at bedside regarding discharge plan. Patient's spouse advised of therapy's recommendation for [facility]. He is agreeable to [facility] and would like [facility] however,  only wants PT. He does not feel other services dare needed. He also inquired about patient's care long term. RNCM advised for LTC payor would have to obtain LTSS. He was advised to apply for LTSS at DSS. RNCM advised the accepting [facility] agency would contact him regarding SOC at discharge.           Expected Discharge Plan and Services                                               Social Determinants of Health (SDOH) Interventions SDOH Screenings   Food Insecurity: No Food Insecurity (11/02/2022)  Housing: Low Risk  (11/02/2022)  Transportation Needs: No Transportation Needs (11/02/2022)  Utilities: Not At Risk (11/02/2022)  Tobacco Use: Low Risk  (11/02/2022)    Readmission Risk Interventions     No data to display

## 2022-11-06 NOTE — Discharge Summary (Signed)
Physician Discharge Summary   Patient: Brittney Ray MRN: [id number] DOB: 05/08/1950  Admit date:     11/01/2022  Discharge date: 11/05/2022  Discharge Physician: Delfino Lovett   PCP: Marisue Ivan, MD   Recommendations at discharge:    F/up with outpt providers as requested  Discharge Diagnoses: Principal Problem:   Acute pyelonephritis Active Problems:   Acquired hypothyroidism   Essential hypertension   Type II diabetes mellitus with renal manifestations (HCC)   Mixed hyperlipidemia   Chronic kidney disease, stage 3a (HCC)   TIA (transient ischemic attack)   Coronary artery disease   Anxiety and depression   History of progressive supranuclear palsy   Fall at home, initial encounter  Hospital Course: Assessment and Plan:  Brittney Ray is a 72 y.o. female with medical history significant of progressive supranuclear palsy, hypertension, hyperlipidemia, diabetes mellitus, CAD, TIA, hypothyroidism, depression with anxiety, CKD-3A, Paget bone disease, breast cancer (s/p of right lumpectomy, radiation therapy), who presents with lower back/flank pain, increased urinary frequency.    10/23: TOC working on placement, Abx changed to IV Ancef   Assessment and Plan: E. Coli UTI Urine cultures grew E.coli pan sensitive.- Abx changed to IV Ancef her flank pain likely due to hematomas. Pyelonephritis ruled out   Possible bilateral hematomas. Most likely due to multiple falls. H&H remained stable. Fall precautions.   Multiple falls. Patient does have history of progressive supranuclear palsy. Continue fall precautions. PT/ OT recommends [facility] and patient/family agreeable for same. Outpt f/up with Dr Cristopher Peru (Neuro)   Type 2 diabetes mellitus. Chronic kidney disease stage III a. Renal function at baseline.  Essential hypertension Continue Coreg, amlodipine therapy.   Mixed hyperlipidemia. TIA. Continue Lipitor, fenofibrate therapy. Held aspirin due to  hematomas while in the Hospital. Resume at DC   CAD. Continue Lipitor, held aspirin while in the Hospital. Resume at DC   Anxiety and depression. Continue current home medications.        Disposition: Home health Diet recommendation:  Discharge Diet Orders (From admission, onward)     Start     Ordered   11/05/22 0000  Diet - low sodium heart healthy        11/05/22 0944           Carb modified diet DISCHARGE MEDICATION: Allergies as of 11/05/2022       Reactions   Contrast Media [iodinated Contrast Media] Hives, Cough   Codeine Anxiety   Morphine And Codeine Other (See Comments)   Tremors   Sulfa Antibiotics Nausea And Vomiting        Medication List     TAKE these medications    ACCU-CHEK AVIVA PLUS VI 1 each as directed   Accu-Chek Aviva Plus test strip Generic drug: glucose blood USE 1 STRIP TO CHECK GLUCOSE THREE TIMES DAILY   alendronate 70 MG tablet Commonly known as: FOSAMAX Take 70 mg by mouth once a week. Take with a full glass of water on an empty stomach.   amLODipine 5 MG tablet Commonly known as: NORVASC Take 5 mg by mouth daily.   aspirin 325 MG tablet Take 325 mg by mouth daily.   atorvastatin 40 MG tablet Commonly known as: LIPITOR Take 40 mg by mouth at bedtime.   busPIRone 5 MG tablet Commonly known as: BUSPAR Take 5 mg by mouth 2 (two) times daily.   Calcium Carb-Cholecalciferol 600-800 MG-UNIT Tabs Take 1 tablet by mouth daily with breakfast.   carvedilol 3.125 MG tablet Commonly  known as: COREG Take 3.125 mg by mouth 2 (two) times daily with a meal.   escitalopram 10 MG tablet Commonly known as: LEXAPRO Take 1 tablet (10 mg total) by mouth daily.   fenofibrate 160 MG tablet Take 1 tablet (160 mg total) by mouth daily.   levothyroxine 25 MCG tablet Commonly known as: SYNTHROID Take 25 mcg by mouth daily before breakfast.   lisinopril-hydrochlorothiazide 20-12.5 MG tablet Commonly known as: ZESTORETIC Take  1 tablet by mouth daily.   metFORMIN 500 MG tablet Commonly known as: GLUCOPHAGE Take 500 mg by mouth at bedtime.   pantoprazole 40 MG tablet Commonly known as: PROTONIX Take 40 mg by mouth daily.        Follow-up Information     Marisue Ivan, MD. Go on 11/12/2022.   Specialty: Family Medicine Why: [facility] Discharge F/UP.You will see PA Mahalia Longest at 10:00am. Contact information: [address] [place] [address] [phone]         Lonell Face, MD. Schedule an appointment as soon as possible for a visit in 2 week(s).   Specialty: Neurology Why: [facility] Discharge F/UP Contact information: [address] [facility] [place] [address] [phone]                Discharge Exam: There were no vitals filed for this visit. General - Elderly Caucasian female,no acute distress HEENT - PERRLA, EOMI, atraumatic head, non tender sinuses. Lung - Clear, bibasilar rales noted Heart - S1, S2 heard, no murmurs, rubs, trace pedal edema. Abdomen - Soft, benign Neuro - Alert, awake and oriented x 3, non focal exam.  Condition at discharge: fair  The results of significant diagnostics from this hospitalization (including imaging, microbiology, ancillary and laboratory) are listed below for reference.   Imaging Studies: CT L-SPINE NO CHARGE  Result Date: 11/01/2022 CLINICAL DATA:  Right-sided low back pain EXAM: CT LUMBAR SPINE WITHOUT CONTRAST TECHNIQUE: Multidetector CT imaging of the lumbar spine was performed without intravenous contrast administration. Multiplanar CT image reconstructions were also generated. RADIATION DOSE REDUCTION: This exam was performed according to the departmental dose-optimization program which includes automated exposure control, adjustment of the mA and/or kV according to patient size and/or use of iterative reconstruction technique. COMPARISON:  09/26/2015 FINDINGS: Segmentation: 5  lumbar type vertebrae. Alignment: No traumatic listhesis. Vertebrae: Chronic pagetoid changes of the L2 vertebral body and posterior elements, unchanged in appearance compared to multiple previous studies. No acute fracture of the lumbar spine. No suspicious lytic or sclerotic bone lesion. Paraspinal and other soft tissues: Partially imaged fluid collection in the subcutaneous soft tissues of the right low back, better assessed on concurrently obtained CT abdomen pelvis. Aortic atherosclerosis. Hyperdense lower pole right renal cyst. Sigmoid diverticulosis. Disc levels: Chronic disc height loss and endplate spurring at L5-S1. Mild multilevel facet arthropathy. High-grade foraminal stenosis bilaterally at L5-S1 and on the left at L4-L5. Expansion of the L2 vertebral body contributes to mass effect upon the canal at this level. IMPRESSION: 1. No acute fracture or traumatic listhesis of the lumbar spine. 2. Chronic pagetoid changes of the L2 vertebral body and posterior elements, unchanged in appearance compared to multiple previous studies. 3. High-grade foraminal stenosis bilaterally at L5-S1 and on the left at L4-L5. 4. Partially imaged fluid collection in the subcutaneous soft tissues of the right low back, better assessed on concurrently obtained CT abdomen pelvis. 5. Aortic atherosclerosis (ICD10-I70.0). Electronically Signed   By: Duanne Guess D.O.   On:  11/01/2022 20:02   CT Renal Stone Study  Result Date: 11/01/2022 CLINICAL DATA:  Abdominal/flank pain, stone suspected EXAM: CT ABDOMEN AND PELVIS WITHOUT CONTRAST TECHNIQUE: Multidetector CT imaging of the abdomen and pelvis was performed following the standard protocol without IV contrast. RADIATION DOSE REDUCTION: This exam was performed according to the departmental dose-optimization program which includes automated exposure control, adjustment of the mA and/or kV according to patient size and/or use of iterative reconstruction technique.  COMPARISON:  01/22/2019 FINDINGS: Lower chest: No acute abnormality. Hepatobiliary: No focal liver abnormality is seen. Status post cholecystectomy. No biliary dilatation. Pancreas: Unremarkable. No pancreatic ductal dilatation or surrounding inflammatory changes. Spleen: Normal in size without focal abnormality. Adrenals/Urinary Tract: Unremarkable adrenal glands. Stable 3.7 cm rounded intermediate density lesion arising from the inferior pole of the right kidney compatible with a hyperdense cyst. No renal stone or hydronephrosis. Urinary bladder within normal limits for the degree of distension. Stomach/Bowel: Stomach is within normal limits. Colonic diverticulosis. No evidence of bowel wall thickening, distention, or inflammatory changes. Vascular/Lymphatic: Aortic atherosclerosis. No enlarged abdominal or pelvic lymph nodes. Reproductive: Status post hysterectomy. No adnexal masses. Other: No free fluid. No abdominopelvic fluid collection. No pneumoperitoneum. No abdominal wall hernia. Musculoskeletal: There are 2 adjacent well-circumscribed fluid collections within the subcutaneous soft tissues of the right lower back measuring 8.0 x 2.8 x 7.5 cm (volume = 88 cm^3) and 4.0 x 2.1 x 3.2 cm (volume = 14 cm^3). Both of these collections have well-defined walls. There is surrounding fat stranding within the adjacent subcutaneous fat. Multiple chronic appearing bilateral rib fractures. Unchanged appearance of the proximal left femur and L2 vertebral body which may be related to Paget's disease. IMPRESSION: 1. No renal stone or hydronephrosis. 2. There are two adjacent well-circumscribed fluid collections within the subcutaneous soft tissues of the right lower back measuring 8.0 x 2.8 x 7.5 cm and 4.0 x 2.1 x 3.2 cm. There is surrounding fat stranding within the adjacent subcutaneous fat. These could represent liquefied hematomas or abscesses. 3. Colonic diverticulosis without evidence of acute diverticulitis. 4.  Aortic atherosclerosis (ICD10-I70.0). Electronically Signed   By: Duanne Guess D.O.   On: 11/01/2022 19:58    Microbiology: Results for orders placed or performed during the hospital encounter of 11/01/22  Urine Culture     Status: Abnormal   Collection Time: 11/01/22  5:17 PM   Specimen: Urine, Clean Catch  Result Value Ref Range Status   Specimen Description   Final    URINE, CLEAN CATCH Performed at [facility], [address], [place], [place] [address]    Special Requests   Final    NONE Performed at [facility], [address] [place], [place] [address]    Culture >=100,000 COLONIES/mL ESCHERICHIA COLI (A)  Final   Report Status 11/03/2022 FINAL  Final   Organism ID, Bacteria ESCHERICHIA COLI (A)  Final      Susceptibility   Escherichia coli - MIC*    AMPICILLIN 4 SENSITIVE Sensitive     CEFAZOLIN <=4 SENSITIVE Sensitive     CEFEPIME <=0.12 SENSITIVE Sensitive     CEFTRIAXONE <=0.25 SENSITIVE Sensitive     CIPROFLOXACIN <=0.25 SENSITIVE Sensitive     GENTAMICIN <=1 SENSITIVE Sensitive     IMIPENEM <=0.25 SENSITIVE Sensitive     NITROFURANTOIN <=16 SENSITIVE Sensitive     TRIMETH/SULFA <=20 SENSITIVE Sensitive     AMPICILLIN/SULBACTAM <=2 SENSITIVE Sensitive     PIP/TAZO <=4 SENSITIVE Sensitive ug/mL    * >=100,000  COLONIES/mL ESCHERICHIA COLI    Labs: CBC: Recent Labs  Lab 11/01/22 1453 11/02/22 0554 11/03/22 0817 11/05/22 0514  WBC 5.1 7.1 5.5 4.3  HGB 11.0* 10.7* 10.8* 10.1*  HCT 34.4* 33.6* 32.9* 30.6*  MCV 91.0 91.8 89.9 89.7  PLT 211 195 187 167   Basic Metabolic Panel: Recent Labs  Lab 11/01/22 1453 11/02/22 0554 11/03/22 0626 11/05/22 0514  NA 138 138 138 138  K 3.9 3.8 4.6 4.1  CL 107 105 105 106  CO2 23 25 25 27   GLUCOSE 154* 128* 92 107*  BUN 37* 31* 37* 44*  CREATININE 1.15* 1.15* 1.28* 1.32*  CALCIUM 9.2 9.0 8.7* 8.7*   Liver Function Tests: Recent Labs  Lab 11/01/22 1453  AST 20  ALT 15  ALKPHOS 27*   BILITOT 0.4  PROT 6.6  ALBUMIN 3.5   CBG: Recent Labs  Lab 11/04/22 1143 11/04/22 1654 11/04/22 2133 11/05/22 0737 11/05/22 1154  GLUCAP 103* 100* 116* 97 106*    Discharge time spent: greater than 30 minutes.  Signed: Delfino Lovett, MD [facility] 11/06/2022

## 2023-01-25 ENCOUNTER — Emergency Department: Payer: Medicare Other

## 2023-01-25 ENCOUNTER — Emergency Department
Admission: EM | Admit: 2023-01-25 | Discharge: 2023-01-25 | Disposition: A | Discharge status: Home / Self Care | Payer: Medicare Other | Attending: Emergency Medicine | Admitting: Emergency Medicine

## 2023-01-25 ENCOUNTER — Encounter: Payer: Self-pay | Admitting: Emergency Medicine

## 2023-01-25 ENCOUNTER — Other Ambulatory Visit: Payer: Self-pay

## 2023-01-25 DIAGNOSIS — S300XXA Contusion of lower back and pelvis, initial encounter: Secondary | ICD-10-CM | POA: Diagnosis not present

## 2023-01-25 DIAGNOSIS — W19XXXA Unspecified fall, initial encounter: Secondary | ICD-10-CM | POA: Insufficient documentation

## 2023-01-25 DIAGNOSIS — Y92009 Unspecified place in unspecified non-institutional (private) residence as the place of occurrence of the external cause: Secondary | ICD-10-CM | POA: Insufficient documentation

## 2023-01-25 DIAGNOSIS — M549 Dorsalgia, unspecified: Secondary | ICD-10-CM | POA: Diagnosis present

## 2023-01-25 DIAGNOSIS — E1122 Type 2 diabetes mellitus with diabetic chronic kidney disease: Secondary | ICD-10-CM | POA: Diagnosis not present

## 2023-01-25 DIAGNOSIS — I129 Hypertensive chronic kidney disease with stage 1 through stage 4 chronic kidney disease, or unspecified chronic kidney disease: Secondary | ICD-10-CM | POA: Diagnosis not present

## 2023-01-25 DIAGNOSIS — N189 Chronic kidney disease, unspecified: Secondary | ICD-10-CM | POA: Insufficient documentation

## 2023-01-25 MED ORDER — HYDROCODONE-ACETAMINOPHEN 5-325 MG PO TABS: 1.0000 | ORAL_TABLET | Freq: Three times a day (TID) | ORAL | 0 refills | Status: AC | PRN

## 2023-01-25 MED ORDER — HYDROCODONE-ACETAMINOPHEN 5-325 MG PO TABS
1.0000 | ORAL_TABLET | Freq: Once | ORAL | Status: AC
  Administered 2023-01-25: 1 via ORAL
  Filled 2023-01-25: qty 1

## 2023-01-25 NOTE — Discharge Instructions (Addendum)
 Your exam and XR are normal and reassuring. Take the prescription pain medicine as needed. Follow-up with your PCP as needed.

## 2023-01-25 NOTE — ED Provider Notes (Signed)
 [facility] Emergency Department Provider Note     Event Date/Time   First MD Initiated Contact with Patient 01/25/23 1556     (approximate)   History   Fall   HPI  Brittney Ray is a 73 y.o. female with a history of HTN, DM, hypothyroidism, GERD, CKD, and NSTEMI, presents to the ED reporting a mechanical fall.  Patient reports she was trying to use a walker when she fell striking her lower back on a trash can.  She endorses back pain but denies any head injury or LOC.    Physical Exam   Triage Vital Signs: ED Triage Vitals  Encounter Vitals Group     BP 01/25/23 1442 131/63     Systolic BP Percentile --      Diastolic BP Percentile --      Pulse Rate 01/25/23 1442 75     Resp 01/25/23 1442 17     Temp 01/25/23 1442 98.4 F (36.9 C)     Temp Source 01/25/23 1442 Oral     SpO2 01/25/23 1442 95 %     Weight 01/25/23 1443 147 lb (66.7 kg)     Height 01/25/23 1443 5' 1 (1.549 m)     Head Circumference --      Peak Flow --      Pain Score 01/25/23 1442 10     Pain Loc --      Pain Education --      Exclude from Growth Chart --     Most recent vital signs: Vitals:   01/25/23 1442 01/25/23 1927  BP: 131/63 (!) 140/66  Pulse: 75 69  Resp: 17 18  Temp: 98.4 F (36.9 C) 98 F (36.7 C)  SpO2: 95% 96%    General Awake, no distress. NAD HEENT NCAT. PERRL. EOMI. No rhinorrhea. Mucous membranes are moist.  CV:  Good peripheral perfusion. RRR RESP:  Normal effort. CTA ABD:  No distention.  MSK:  Normal spinal alignment without midline tenderness, spasm, deformity, or step-off. NEURO: Cranial nerve 2-12 grossly intact.  ED Results / Procedures / Treatments   Labs (all labs ordered are listed, but only abnormal results are displayed) Labs Reviewed - No data to display   EKG   RADIOLOGY  I personally viewed and evaluated these images as part of my medical decision making, as well as reviewing the written report by the  radiologist.  ED Provider Interpretation: No acute findings  DG Lumbar Spine   IMPRESSION: 1. No acute fracture of the lumbar spine. 2. Multilevel degenerative disc changes, most pronounced at L5-S1. 3. Similar chronic pagetoid changes of the L2 vertebral body.   PROCEDURES:  Critical Care performed: No  Procedures   MEDICATIONS ORDERED IN ED: Medications  HYDROcodone -acetaminophen  (NORCO/VICODIN) 5-325 MG per tablet 1 tablet (1 tablet Oral Given 01/25/23 1925)     IMPRESSION / MDM / ASSESSMENT AND PLAN / ED COURSE  I reviewed the triage vital signs and the nursing notes.                              Differential diagnosis includes, but is not limited to, lumbar strain, lumbar contusion, lumbar fracture, lumbar radiculopathy, myalgias  Patient's presentation is most consistent with acute complicated illness / injury requiring diagnostic workup.  Patient's diagnosis is consistent with mechanical fall resulting in lumbar contusion.  Patient presents to the ED for evaluation of injuries following mechanical  fall.  She endorses back pain.  Reassuring exam and workup at this time.  No acute neuromuscular deficits.  No radiologic evidence of any acute fracture or dislocation, based on my interpretation.  Patient will be discharged home with prescriptions for hydrocodone . Patient is to follow up with the primary provider as discussed, as needed or otherwise directed. Patient is given ED precautions to return to the ED for any worsening or new symptoms.  FINAL CLINICAL IMPRESSION(S) / ED DIAGNOSES   Final diagnoses:  Fall in home, initial encounter  Lumbar contusion, initial encounter     Rx / DC Orders   ED Discharge Orders          Ordered    HYDROcodone -acetaminophen  (NORCO) 5-325 MG tablet  3 times daily PRN        01/25/23 1910             Note:  This document was prepared using Dragon voice recognition software and may include unintentional dictation  errors.    Loyd Candida LULLA Aldona, PA-C 01/30/23 1331    Dorothyann Drivers, MD 01/30/23 1614

## 2023-01-25 NOTE — ED Triage Notes (Signed)
 Pt in via POV, reports mechanical fall while trying to use walker today, striking left lower back on wooden trash can.  Complaints of back pain w/ any movement.  NAD noted at this time.

## 2023-02-19 ENCOUNTER — Emergency Department: Payer: Medicare Other

## 2023-02-19 ENCOUNTER — Inpatient Hospital Stay
Admission: EM | Admit: 2023-02-19 | Discharge: 2023-02-22 | DRG: 377 | Disposition: A | Discharge status: Home Health | Payer: Medicare Other | Attending: Internal Medicine | Admitting: Internal Medicine

## 2023-02-19 DIAGNOSIS — E1129 Type 2 diabetes mellitus with other diabetic kidney complication: Secondary | ICD-10-CM | POA: Diagnosis present

## 2023-02-19 DIAGNOSIS — K264 Chronic or unspecified duodenal ulcer with hemorrhage: Secondary | ICD-10-CM | POA: Diagnosis present

## 2023-02-19 DIAGNOSIS — K2101 Gastro-esophageal reflux disease with esophagitis, with bleeding: Secondary | ICD-10-CM | POA: Diagnosis present

## 2023-02-19 DIAGNOSIS — Z90710 Acquired absence of both cervix and uterus: Secondary | ICD-10-CM

## 2023-02-19 DIAGNOSIS — E782 Mixed hyperlipidemia: Secondary | ICD-10-CM | POA: Diagnosis present

## 2023-02-19 DIAGNOSIS — D649 Anemia, unspecified: Secondary | ICD-10-CM

## 2023-02-19 DIAGNOSIS — I10 Essential (primary) hypertension: Secondary | ICD-10-CM | POA: Diagnosis present

## 2023-02-19 DIAGNOSIS — Y92009 Unspecified place in unspecified non-institutional (private) residence as the place of occurrence of the external cause: Secondary | ICD-10-CM

## 2023-02-19 DIAGNOSIS — Z1152 Encounter for screening for COVID-19: Secondary | ICD-10-CM

## 2023-02-19 DIAGNOSIS — Z86000 Personal history of in-situ neoplasm of breast: Secondary | ICD-10-CM

## 2023-02-19 DIAGNOSIS — G231 Progressive supranuclear ophthalmoplegia [Steele-Richardson-Olszewski]: Secondary | ICD-10-CM | POA: Diagnosis present

## 2023-02-19 DIAGNOSIS — I2510 Atherosclerotic heart disease of native coronary artery without angina pectoris: Secondary | ICD-10-CM | POA: Diagnosis present

## 2023-02-19 DIAGNOSIS — R531 Weakness: Principal | ICD-10-CM

## 2023-02-19 DIAGNOSIS — Z66 Do not resuscitate: Secondary | ICD-10-CM | POA: Diagnosis present

## 2023-02-19 DIAGNOSIS — Z803 Family history of malignant neoplasm of breast: Secondary | ICD-10-CM

## 2023-02-19 DIAGNOSIS — K254 Chronic or unspecified gastric ulcer with hemorrhage: Secondary | ICD-10-CM | POA: Diagnosis not present

## 2023-02-19 DIAGNOSIS — E039 Hypothyroidism, unspecified: Secondary | ICD-10-CM | POA: Diagnosis not present

## 2023-02-19 DIAGNOSIS — N179 Acute kidney failure, unspecified: Secondary | ICD-10-CM | POA: Diagnosis present

## 2023-02-19 DIAGNOSIS — R296 Repeated falls: Secondary | ICD-10-CM | POA: Diagnosis present

## 2023-02-19 DIAGNOSIS — Z91041 Radiographic dye allergy status: Secondary | ICD-10-CM

## 2023-02-19 DIAGNOSIS — K922 Gastrointestinal hemorrhage, unspecified: Secondary | ICD-10-CM | POA: Diagnosis not present

## 2023-02-19 DIAGNOSIS — I959 Hypotension, unspecified: Secondary | ICD-10-CM | POA: Diagnosis present

## 2023-02-19 DIAGNOSIS — F0393 Unspecified dementia, unspecified severity, with mood disturbance: Secondary | ICD-10-CM | POA: Diagnosis present

## 2023-02-19 DIAGNOSIS — Z8673 Personal history of transient ischemic attack (TIA), and cerebral infarction without residual deficits: Secondary | ICD-10-CM

## 2023-02-19 DIAGNOSIS — W19XXXA Unspecified fall, initial encounter: Secondary | ICD-10-CM | POA: Diagnosis not present

## 2023-02-19 DIAGNOSIS — G459 Transient cerebral ischemic attack, unspecified: Secondary | ICD-10-CM | POA: Diagnosis present

## 2023-02-19 DIAGNOSIS — E1122 Type 2 diabetes mellitus with diabetic chronic kidney disease: Secondary | ICD-10-CM | POA: Diagnosis present

## 2023-02-19 DIAGNOSIS — K921 Melena: Secondary | ICD-10-CM | POA: Diagnosis not present

## 2023-02-19 DIAGNOSIS — Z79890 Hormone replacement therapy (postmenopausal): Secondary | ICD-10-CM

## 2023-02-19 DIAGNOSIS — D62 Acute posthemorrhagic anemia: Secondary | ICD-10-CM | POA: Diagnosis present

## 2023-02-19 DIAGNOSIS — Z923 Personal history of irradiation: Secondary | ICD-10-CM

## 2023-02-19 DIAGNOSIS — K279 Peptic ulcer, site unspecified, unspecified as acute or chronic, without hemorrhage or perforation: Secondary | ICD-10-CM

## 2023-02-19 DIAGNOSIS — E860 Dehydration: Secondary | ICD-10-CM | POA: Diagnosis present

## 2023-02-19 DIAGNOSIS — Z882 Allergy status to sulfonamides status: Secondary | ICD-10-CM

## 2023-02-19 DIAGNOSIS — Z7983 Long term (current) use of bisphosphonates: Secondary | ICD-10-CM

## 2023-02-19 DIAGNOSIS — F418 Other specified anxiety disorders: Secondary | ICD-10-CM | POA: Diagnosis present

## 2023-02-19 DIAGNOSIS — Z7984 Long term (current) use of oral hypoglycemic drugs: Secondary | ICD-10-CM

## 2023-02-19 DIAGNOSIS — N1831 Chronic kidney disease, stage 3a: Secondary | ICD-10-CM | POA: Diagnosis present

## 2023-02-19 DIAGNOSIS — I129 Hypertensive chronic kidney disease with stage 1 through stage 4 chronic kidney disease, or unspecified chronic kidney disease: Secondary | ICD-10-CM | POA: Diagnosis present

## 2023-02-19 DIAGNOSIS — Z79899 Other long term (current) drug therapy: Secondary | ICD-10-CM

## 2023-02-19 DIAGNOSIS — Z7982 Long term (current) use of aspirin: Secondary | ICD-10-CM

## 2023-02-19 DIAGNOSIS — Z885 Allergy status to narcotic agent status: Secondary | ICD-10-CM

## 2023-02-19 DIAGNOSIS — F0394 Unspecified dementia, unspecified severity, with anxiety: Secondary | ICD-10-CM | POA: Diagnosis present

## 2023-02-19 LAB — CBC
HCT: 18.9 % — ABNORMAL LOW (ref 36.0–46.0)
HCT: 17.3 % — ABNORMAL LOW (ref 36.0–46.0)
Hemoglobin: 6.1 g/dL — ABNORMAL LOW (ref 12.0–15.0)
Hemoglobin: 5.5 g/dL — ABNORMAL LOW (ref 12.0–15.0)
MCH: 29 pg (ref 26.0–34.0)
MCH: 29.1 pg (ref 26.0–34.0)
MCHC: 32.3 g/dL (ref 30.0–36.0)
MCHC: 31.8 g/dL (ref 30.0–36.0)
MCV: 90 fL (ref 80.0–100.0)
MCV: 91.5 fL (ref 80.0–100.0)
Platelets: 216 10*3/uL (ref 150–400)
Platelets: 196 10*3/uL (ref 150–400)
RBC: 2.1 MIL/uL — ABNORMAL LOW (ref 3.87–5.11)
RBC: 1.89 MIL/uL — ABNORMAL LOW (ref 3.87–5.11)
RDW: 16 % — ABNORMAL HIGH (ref 11.5–15.5)
RDW: 15.9 % — ABNORMAL HIGH (ref 11.5–15.5)
WBC: 10.7 10*3/uL — ABNORMAL HIGH (ref 4.0–10.5)
WBC: 9.6 10*3/uL (ref 4.0–10.5)
nRBC: 0 % (ref 0.0–0.2)
nRBC: 0 % (ref 0.0–0.2)

## 2023-02-19 LAB — BASIC METABOLIC PANEL
Anion gap: 9 (ref 5–15)
BUN: 99 mg/dL — ABNORMAL HIGH (ref 8–23)
CO2: 21 mmol/L — ABNORMAL LOW (ref 22–32)
Calcium: 9.3 mg/dL (ref 8.9–10.3)
Chloride: 109 mmol/L (ref 98–111)
Creatinine, Ser: 1.9 mg/dL — ABNORMAL HIGH (ref 0.44–1.00)
GFR, Estimated: 28 mL/min — ABNORMAL LOW (ref >60)
Glucose, Bld: 156 mg/dL — ABNORMAL HIGH (ref 70–99)
Potassium: 4.2 mmol/L (ref 3.5–5.1)
Sodium: 139 mmol/L (ref 135–145)

## 2023-02-19 LAB — CBG MONITORING, ED
Glucose-Capillary: 148 mg/dL — ABNORMAL HIGH (ref 70–99)
Glucose-Capillary: 112 mg/dL — ABNORMAL HIGH (ref 70–99)

## 2023-02-19 LAB — RETICULOCYTES
Immature Retic Fract: 24.9 % — ABNORMAL HIGH (ref 2.3–15.9)
RBC.: 1.88 MIL/uL — ABNORMAL LOW (ref 3.87–5.11)
Retic Count, Absolute: 96.8 10*3/uL (ref 19.0–186.0)
Retic Ct Pct: 5.2 % — ABNORMAL HIGH (ref 0.4–3.1)

## 2023-02-19 LAB — RESP PANEL BY RT-PCR (RSV, FLU A&B, COVID)  RVPGX2
Influenza A by PCR: NEGATIVE
Influenza B by PCR: NEGATIVE
Resp Syncytial Virus by PCR: NEGATIVE
SARS Coronavirus 2 by RT PCR: NEGATIVE

## 2023-02-19 LAB — PREPARE RBC (CROSSMATCH)

## 2023-02-19 LAB — ABO/RH: ABO/RH(D): A NEG

## 2023-02-19 LAB — PROTIME-INR
INR: 1.3 — ABNORMAL HIGH (ref 0.8–1.2)
Prothrombin Time: 16 s — ABNORMAL HIGH (ref 11.4–15.2)

## 2023-02-19 LAB — APTT: aPTT: 22 s — ABNORMAL LOW (ref 24–36)

## 2023-02-19 MED ORDER — FENOFIBRATE 160 MG PO TABS
160.0000 mg | ORAL_TABLET | Freq: Every day | ORAL | Status: DC
  Administered 2023-02-20 – 2023-02-22 (×2): 160 mg via ORAL
  Filled 2023-02-19 (×4): qty 1

## 2023-02-19 MED ORDER — BUSPIRONE HCL 10 MG PO TABS
5.0000 mg | ORAL_TABLET | Freq: Two times a day (BID) | ORAL | Status: DC
  Administered 2023-02-20 – 2023-02-22 (×4): 5 mg via ORAL
  Filled 2023-02-19 (×4): qty 1

## 2023-02-19 MED ORDER — PANTOPRAZOLE SODIUM 40 MG IV SOLR
40.0000 mg | Freq: Once | INTRAVENOUS | Status: DC
  Administered 2023-02-19: 40 mg via INTRAVENOUS
  Filled 2023-02-19: qty 10

## 2023-02-19 MED ORDER — HYDRALAZINE HCL 20 MG/ML IJ SOLN: 5.0000 mg | INTRAMUSCULAR | Status: DC | PRN

## 2023-02-19 MED ORDER — ACETAMINOPHEN 325 MG PO TABS
650.0000 mg | ORAL_TABLET | Freq: Four times a day (QID) | ORAL | Status: DC | PRN
  Administered 2023-02-21 – 2023-02-22 (×2): 650 mg via ORAL
  Filled 2023-02-19 (×2): qty 2

## 2023-02-19 MED ORDER — ATORVASTATIN CALCIUM 20 MG PO TABS
40.0000 mg | ORAL_TABLET | Freq: Every day | ORAL | Status: DC
  Administered 2023-02-20 – 2023-02-21 (×2): 40 mg via ORAL
  Filled 2023-02-19 (×2): qty 2

## 2023-02-19 MED ORDER — PANTOPRAZOLE SODIUM 40 MG IV SOLR
40.0000 mg | Freq: Two times a day (BID) | INTRAVENOUS | Status: DC
  Administered 2023-02-20 – 2023-02-21 (×4): 40 mg via INTRAVENOUS
  Filled 2023-02-19 (×4): qty 10

## 2023-02-19 MED ORDER — INSULIN ASPART 100 UNIT/ML IJ SOLN
0.0000 [IU] | Freq: Three times a day (TID) | INTRAMUSCULAR | Status: DC
  Administered 2023-02-21 – 2023-02-22 (×2): 2 [IU] via SUBCUTANEOUS
  Filled 2023-02-19 (×2): qty 1

## 2023-02-19 MED ORDER — LEVOTHYROXINE SODIUM 25 MCG PO TABS
25.0000 ug | ORAL_TABLET | Freq: Every day | ORAL | Status: DC
  Administered 2023-02-20 – 2023-02-22 (×2): 25 ug via ORAL
  Filled 2023-02-19 (×2): qty 1

## 2023-02-19 MED ORDER — ONDANSETRON HCL 4 MG/2ML IJ SOLN: 4.0000 mg | Freq: Three times a day (TID) | INTRAMUSCULAR | Status: DC | PRN

## 2023-02-19 MED ORDER — SODIUM CHLORIDE 0.9 % IV SOLN: INTRAVENOUS | Status: AC

## 2023-02-19 MED ORDER — ESCITALOPRAM OXALATE 10 MG PO TABS
10.0000 mg | ORAL_TABLET | Freq: Every day | ORAL | Status: DC
  Administered 2023-02-20 – 2023-02-22 (×2): 10 mg via ORAL
  Filled 2023-02-19 (×2): qty 1

## 2023-02-19 MED ORDER — INSULIN ASPART 100 UNIT/ML IJ SOLN
0.0000 [IU] | Freq: Every day | INTRAMUSCULAR | Status: DC
  Administered 2023-02-21: 1 [IU] via SUBCUTANEOUS
  Filled 2023-02-19: qty 1

## 2023-02-19 MED ORDER — LACTATED RINGERS IV BOLUS
1000.0000 mL | Freq: Once | INTRAVENOUS | Status: AC
  Administered 2023-02-19: 1000 mL via INTRAVENOUS

## 2023-02-19 MED ORDER — HALOPERIDOL LACTATE 5 MG/ML IJ SOLN
2.0000 mg | Freq: Once | INTRAMUSCULAR | Status: AC
  Administered 2023-02-19: 2 mg via INTRAVENOUS
  Filled 2023-02-19: qty 1

## 2023-02-19 NOTE — ED Triage Notes (Signed)
 Pt arrives via [facility] from home for increasing weakness over the last week. Pt's sister reports that she has had frequent falls recently, decreased PO intake. Pt denies cough, fever, chills. Sister reports that they called EMS at 1400 d/t pt lethargy and had BP of 80/50. Pt has bruising and swelling to R middle finger.

## 2023-02-19 NOTE — H&P (Signed)
 History and Physical    Brittney Ray FMW:[id number] DOB: May 06, 1950 DOA: 02/19/2023  Referring MD/NP/PA:   PCP: Alla Amis, MD   Patient coming from:  The patient is coming from home.     Chief Complaint: Weakness, dark stool, fall  HPI: Brittney Ray is a 73 y.o. female with medical history significant of progressive supranuclear palsy, hypertension, hyperlipidemia, diabetes mellitus, CAD, TIA, hypothyroidism, depression with anxiety, CKD-3A, Paget bone disease, breast cancer (s/p of right lumpectomy, radiation therapy), who presents with weakness, dark stool and fall.  Patient has a history of dementia, cannot provide accurate medical history.  I called her husband, who provided most of the history. Per her husband, patient has been declining recently, with generalized weakness.  Patient has had multiple falls.  She has poor appetite, decreased oral intake for more than 1 week.  Patient was noted to have several episodes of dark tarry stool bowel movement.  Patient nausea, spitting up, no hematemesis.  Patient does not seem to have abdominal pain or chest pain.  No cough, SOB.  No fever or chills.  Patient is taking aspirin , last dose was in this AM.  Data reviewed independently and ED Course: pt was found to have hemoglobin dropped from 10.1 on 11/05/2022--> 6.1 today, WBC 10.7, worsening renal function, negative PCR for COVID, flu and RSV, blood pressure 80/50, which improved to 92/67 after giving 1 L LR bolus, heart rate of 128 -> 87, RR 16, oxygen saturation 92% on 2 L oxygen.  Chest x-ray negative.  CT of head is negative for acute intracranial abnormalities.  Patient is placed on telemetry bed observation. Dr. Jinny of GI is consulted.  EKG: I have personally reviewed.  Sinus rhythm, QTc 423, nonspecific T wave change.  Heart rate 128.   Review of Systems: Could not be reviewed accurately due to dementia.   Allergy:  Allergies  Allergen Reactions   Contrast  Media [Iodinated Contrast Media] Hives and Cough   Codeine Anxiety   Morphine  And Codeine Other (See Comments)    Tremors   Sulfa Antibiotics Nausea And Vomiting    Past Medical History:  Diagnosis Date   Anxiety    panic attacks; no curren med.   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 > 10 yrs.   Hypothyroidism    Medial meniscus tear 04/2011   right knee   Nocturia    3-4 x/night   Paget's bone disease    hips   Personal history of radiation therapy 2014   right BREAST CA   PONV (postoperative nausea and vomiting)     Past Surgical History:  Procedure Laterality Date   ABDOMINAL HYSTERECTOMY  1978   partial   BACK SURGERY  04/1990, 1996   BREAST BIOPSY Right 03/14/2012   2 areas UOQ of DCIS   BREAST CYST ASPIRATION Left    BREAST LUMPECTOMY Right 03/29/2012   right breast DCIS of two areas. Clear margins   BURCH PROCEDURE  09/1989   with rectal repair   CHOLECYSTECTOMY  11/2005   FOOT SURGERY  1992   left   HAND SURGERY  10/1999   nerve repair right thumb  KNEE SURGERY  07/31/2009   right   KNEE SURGERY  2009   left   THYROID  LOBECTOMY  03/1992   left    Social History:  reports that she has never smoked. She has never used smokeless tobacco. She reports that she does not drink alcohol  and does not use drugs.  Family History:  Family History  Problem Relation Age of Onset   Breast cancer Mother    Breast cancer Sister 108   Breast cancer Paternal Aunt    Breast cancer Paternal Grandmother    Breast cancer Cousin        maternal side 1st cousin     Prior to Admission medications   Medication Sig Start Date End Date Taking? Authorizing Provider  ACCU-CHEK AVIVA PLUS test strip USE 1 STRIP TO CHECK GLUCOSE THREE TIMES DAILY 12/03/17    [provider]  alendronate (FOSAMAX) 70 MG tablet Take 70 mg by mouth once a week. Take with a full glass of water on an empty stomach.    [provider]  amLODipine  (NORVASC ) 5 MG tablet Take 5 mg by mouth daily.    [provider]  aspirin  325 MG tablet Take 325 mg by mouth daily.     [provider]  atorvastatin  (LIPITOR) 40 MG tablet Take 40 mg by mouth at bedtime.    [provider]  busPIRone  (BUSPAR ) 5 MG tablet Take 5 mg by mouth 2 (two) times daily.    [provider]  Calcium  Carb-Cholecalciferol  600-800 MG-UNIT TABS Take 1 tablet by mouth daily with breakfast.     [provider]  carvedilol  (COREG ) 3.125 MG tablet Take 3.125 mg by mouth 2 (two) times daily with a meal.    [provider]  escitalopram  (LEXAPRO ) 10 MG tablet Take 1 tablet (10 mg total) by mouth daily. 11/12/21 02/10/22  Lang Dover, MD  fenofibrate  160 MG tablet Take 1 tablet (160 mg total) by mouth daily. 12/05/17   Salary, Nemiah D, MD  Glucose Blood (ACCU-CHEK AVIVA PLUS VI) 1 each as directed 07/25/14   [provider]  levothyroxine  (SYNTHROID ) 25 MCG tablet Take 25 mcg by mouth daily before breakfast.    [provider]  lisinopril -hydrochlorothiazide  (ZESTORETIC ) 20-12.5 MG tablet Take 1 tablet by mouth daily.    [provider]  metFORMIN (GLUCOPHAGE) 500 MG tablet Take 500 mg by mouth at bedtime.    [provider]  pantoprazole  (PROTONIX ) 40 MG tablet Take 40 mg by mouth daily.     [provider]    Physical Exam: Vitals:   02/19/23 2228 02/19/23 2230 02/19/23 2245 02/19/23 2310  BP: 92/62 94/69 (!) 107/32 (!) 93/26  Pulse: 96 89 90 87  Resp: 20 15 20  (!) 21  Temp: 97.6 F (36.4 C)  98 F (36.7 C)   TempSrc: Oral     SpO2: 100% 100% 100% 100%  Weight:      Height:       General: Not in acute distress HEENT:       Eyes: PERRL, EOMI, no jaundice       ENT: No discharge from  the ears and nose, no pharynx injection, no tonsillar enlargement.        Neck: No JVD, no bruit, no mass felt. Heme: No neck lymph node enlargement. Cardiac: S1/S2, RRR, No murmurs, No gallops or rubs. Respiratory: No rales, wheezing, rhonchi or rubs. GI: Soft, nondistended, nontender, no rebound pain, no organomegaly, BS present. GU:  No hematuria Ext: No pitting leg edema bilaterally. 1+DP/PT pulse bilaterally. Musculoskeletal: No joint deformities, No joint redness or warmth, no limitation of ROM in spin. Skin: No rashes.  Neuro: Confused, partially following command, cranial nerves II-XII grossly intact, moves all extremities Psych: Patient is not psychotic, no suicidal or hemocidal ideation.  Labs on Admission: I have personally reviewed following labs and imaging studies  CBC: Recent Labs  Lab 02/19/23 1501 02/19/23 2052  WBC 10.7* 9.6  HGB 6.1* 5.5*  HCT 18.9* 17.3*  MCV 90.0 91.5  PLT 216 196   Basic Metabolic Panel: Recent Labs  Lab 02/19/23 1501  NA 139  K 4.2  CL 109  CO2 21*  GLUCOSE 156*  BUN 99*  CREATININE 1.90*  CALCIUM  9.3   GFR: Estimated Creatinine Clearance: 23.1 mL/min (A) (by C-G formula based on SCr of 1.9 mg/dL (H)). Liver Function Tests: No results for input(s): AST, ALT, ALKPHOS, BILITOT, PROT, ALBUMIN in the last 168 hours. No results for input(s): LIPASE, AMYLASE in the last 168 hours. No results for input(s): AMMONIA in the last 168 hours. Coagulation Profile: Recent Labs  Lab 02/19/23 2032  INR 1.3*   Cardiac Enzymes: No results for input(s): CKTOTAL, CKMB, CKMBINDEX, TROPONINI in the last 168 hours. BNP (last 3 results) No results for input(s): PROBNP in the last 8760 hours. HbA1C: No results for input(s): HGBA1C in the last 72 hours. CBG: Recent Labs  Lab 02/19/23 1509 02/19/23 2253  GLUCAP 148* 112*   Lipid Profile: No results for input(s): CHOL, HDL, LDLCALC, TRIG, CHOLHDL,  LDLDIRECT in the last 72 hours. Thyroid  Function Tests: No results for input(s): TSH, T4TOTAL, FREET4, T3FREE, THYROIDAB in the last 72 hours. Anemia Panel: No results for input(s): VITAMINB12, FOLATE, FERRITIN, TIBC, IRON, RETICCTPCT in the last 72 hours. Urine analysis:    Component Value Date/Time   COLORURINE YELLOW (A) 11/01/2022 1717   APPEARANCEUR CLOUDY (A) 11/01/2022 1717   APPEARANCEUR Hazy 06/23/2011 1516   LABSPEC 1.021 11/01/2022 1717   LABSPEC 1.013 06/23/2011 1516   PHURINE 5.0 11/01/2022 1717   GLUCOSEU NEGATIVE 11/01/2022 1717   GLUCOSEU Negative 06/23/2011 1516   HGBUR NEGATIVE 11/01/2022 1717   BILIRUBINUR NEGATIVE 11/01/2022 1717   BILIRUBINUR Negative 06/23/2011 1516   KETONESUR NEGATIVE 11/01/2022 1717   PROTEINUR NEGATIVE 11/01/2022 1717   NITRITE POSITIVE (A) 11/01/2022 1717   LEUKOCYTESUR LARGE (A) 11/01/2022 1717   LEUKOCYTESUR Negative 06/23/2011 1516   Sepsis Labs: @LABRCNTIP (procalcitonin:4,lacticidven:4) ) Recent Results (from the past 240 hours)  Resp panel by RT-PCR (RSV, Flu A&B, Covid) Anterior Nasal Swab     Status: None   Collection Time: 02/19/23  3:01 PM   Specimen: Anterior Nasal Swab  Result Value Ref Range Status   SARS Coronavirus 2 by RT PCR NEGATIVE NEGATIVE Final    Comment: (NOTE) SARS-CoV-2 target nucleic acids are NOT DETECTED.  The SARS-CoV-2 RNA is generally detectable in upper respiratory specimens during the acute phase of infection. The lowest concentration of SARS-CoV-2 viral copies this assay can detect is 138 copies/mL. A negative result does not preclude SARS-Cov-2 infection and should not be used as the sole basis for treatment or other patient management decisions. A negative result may occur with  improper specimen collection/handling, submission of specimen other than nasopharyngeal swab, presence of viral mutation(s) within the areas targeted by this assay, and inadequate number of  viral copies(<138 copies/mL). A negative result must be combined with clinical observations, patient history, and epidemiological information. The expected result is  Negative.  Fact Sheet for Patients:  bloggercourse.com  Fact Sheet for Healthcare Providers:  seriousbroker.it  This test is no t yet approved or cleared by the [place]  FDA and  has been authorized for detection and/or diagnosis of SARS-CoV-2 by FDA under an Emergency Use Authorization (EUA). This EUA will remain  in effect (meaning this test can be used) for the duration of the COVID-19 declaration under Section 564(b)(1) of the Act, 21 U.S.C.section 360bbb-3(b)(1), unless the authorization is terminated  or revoked sooner.       Influenza A by PCR NEGATIVE NEGATIVE Final   Influenza B by PCR NEGATIVE NEGATIVE Final    Comment: (NOTE) The Xpert Xpress SARS-CoV-2/FLU/RSV plus assay is intended as an aid in the diagnosis of influenza from Nasopharyngeal swab specimens and should not be used as a sole basis for treatment. Nasal washings and aspirates are unacceptable for Xpert Xpress SARS-CoV-2/FLU/RSV testing.  Fact Sheet for Patients: bloggercourse.com  Fact Sheet for Healthcare Providers: seriousbroker.it  This test is not yet approved or cleared by the [place]  FDA and has been authorized for detection and/or diagnosis of SARS-CoV-2 by FDA under an Emergency Use Authorization (EUA). This EUA will remain in effect (meaning this test can be used) for the duration of the COVID-19 declaration under Section 564(b)(1) of the Act, 21 U.S.C. section 360bbb-3(b)(1), unless the authorization is terminated or revoked.     Resp Syncytial Virus by PCR NEGATIVE NEGATIVE Final    Comment: (NOTE) Fact Sheet for Patients: bloggercourse.com  Fact Sheet for Healthcare  Providers: seriousbroker.it  This test is not yet approved or cleared by the [place]  FDA and has been authorized for detection and/or diagnosis of SARS-CoV-2 by FDA under an Emergency Use Authorization (EUA). This EUA will remain in effect (meaning this test can be used) for the duration of the COVID-19 declaration under Section 564(b)(1) of the Act, 21 U.S.C. section 360bbb-3(b)(1), unless the authorization is terminated or revoked.  Performed at [facility], [address], [place], [place] [address]      Radiological Exams on Admission:   Assessment/Plan Principal Problem:   GI bleeding Active Problems:   Acute blood loss anemia   Fall at home, initial encounter   Acquired hypothyroidism   Acute renal failure superimposed on stage 3a chronic kidney disease (HCC)   Essential hypertension   Type II diabetes mellitus with renal manifestations (HCC)   Mixed hyperlipidemia   TIA (transient ischemic attack)   Coronary artery disease   Depression with anxiety   Assessment and Plan:  GI bleeding and acute blood loss anemia: Hgb  10.1 --> 6.1. Consulted Dr. Jinny of GI.   - will place in med-surg bed for obs - Hold ASA - transfuse 1 unit of blood now - IVF: 1L LR bolus, then at 75 mL/hr - Start IV pantoprazole   40 mg bid - Check anemia panel - Zofran  IV for nausea - Avoid NSAIDs and SQ heparin - Maintain IV access (2 large bore IVs if possible). - Monitor closely and follow q6h cbc, transfuse as necessary, if Hgb<7.0 - LaB: INR, PTT and type screen  Fall at home, initial encounter: CT-head negative -fall precaution -PT/OT  Acquired hypothyroidism -Synthroid   Acute renal failure superimposed on stage 3a chronic kidney disease (HCC): Recent baseline creatinine 1.32 on 11/05/2022.  Her creatinine is 1.90, BUN 99, GFR 28.  Likely due to volume depletion, dehydration and continuation of Zestoretic .  ATN is also possible given  episode of hypotension. -  IV fluid as above -Hold Zestoretic   Essential hypertension -Hold amlodipine , Coreg , Zestoretic  due to hypotension -IV hydralazine  as needed if blood pressure is elevated.  Type II diabetes mellitus with renal manifestations (HCC): Recent A1c 5.0 on 08/24/2021, well-controlled.  Patient taking metformin -Sliding scale insulin   Mixed hyperlipidemia -Lipitor -Tricor   TIA (transient ischemic attack) -Hold aspirin  -Lipitor  Coronary artery disease: No chest pain -Hold aspirin  -Lipitor  Depression with anxiety -Continue home medications      DVT ppx: SCD  Code Status: DNR per his husband who is POA. Per EDP's note, Family reports that she is full cod, but I have discussed with her husband who is the power of attorney.  Per her power of attorney, patient is DNR..   Family Communication:   Yes, patient's husband by phone     Disposition Plan:  Anticipate discharge back to previous environment  Consults called:  Dr. Jinny of GI  Admission status and Level of care: Telemetry Medical:    for obs     Dispo: The patient is from: Home              Anticipated d/c is to: Home              Anticipated d/c date is: 1 day              Patient currently is not medically stable to d/c.    Severity of Illness:  The appropriate patient status for this patient is OBSERVATION. Observation status is judged to be reasonable and necessary in order to provide the required intensity of service to ensure the patient's safety. The patient's presenting symptoms, physical exam findings, and initial radiographic and laboratory data in the context of their medical condition is felt to place them at decreased risk for further clinical deterioration. Furthermore, it is anticipated that the patient will be medically stable for discharge from the hospital within 2 midnights of admission.        Date of Service 02/19/2023    [facility]   If 7PM-7AM,  please contact night-coverage [email or web address] 02/19/2023, 11:28 PM

## 2023-02-19 NOTE — ED Provider Notes (Signed)
 [facility] Provider Note    Event Date/Time   First MD Initiated Contact with Patient 02/19/23 1857     (approximate)   History   Weakness   HPI  Brittney Ray is a 73 y.o. female who presents to the ED for evaluation of Weakness   Review DC summary from admission last October.  Admitted for pyelonephritis.  History of progressive supranuclear palsy, HTN, HLD, DM, CAD, TIAs, CKD 3, breast cancer.  Aspirin  without anticoagulation.  Patient presents with family for evaluation of increased generalized weakness over the past few days.  Melanotic stools.  Some spitting up without true emesis.  No cough or shortness of breath.  History is limited as patient cannot participate in conversation.  Family reports that she is full code.  Physical Exam   Triage Vital Signs: ED Triage Vitals  Encounter Vitals Group     BP 02/19/23 1458 107/60     Systolic BP Percentile --      Diastolic BP Percentile --      Pulse Rate 02/19/23 1458 (!) 128     Resp 02/19/23 1458 18     Temp 02/19/23 1458 97.9 F (36.6 C)     Temp Source 02/19/23 1458 Oral     SpO2 02/19/23 1458 98 %     Weight 02/19/23 1846 147 lb (66.7 kg)     Height 02/19/23 1846 5' 1 (1.549 m)     Head Circumference --      Peak Flow --      Pain Score 02/19/23 1457 0     Pain Loc --      Pain Education --      Exclude from Growth Chart --     Most recent vital signs: Vitals:   02/19/23 1846 02/19/23 1852  BP: 92/67   Pulse: 87   Resp: 16   Temp:    SpO2: 98% 98%    General: Awake, no distress.  CV:  Good peripheral perfusion.  Resp:  Normal effort.  Abd:  No distention.  Epigastric/LUQ mild tenderness without peritoneal features.  Abdomen is otherwise benign. MSK:  No deformity noted.  Neuro:  No focal deficits appreciated. Other:     ED Results / Procedures / Treatments   Labs (all labs ordered are listed, but only abnormal results are displayed) Labs Reviewed  BASIC  METABOLIC PANEL - Abnormal; Notable for the following components:      Result Value   CO2 21 (*)    Glucose, Bld 156 (*)    BUN 99 (*)    Creatinine, Ser 1.90 (*)    GFR, Estimated 28 (*)    All other components within normal limits  CBC - Abnormal; Notable for the following components:   WBC 10.7 (*)    RBC 2.10 (*)    Hemoglobin 6.1 (*)    HCT 18.9 (*)    RDW 16.0 (*)    All other components within normal limits  CBG MONITORING, ED - Abnormal; Notable for the following components:   Glucose-Capillary 148 (*)    All other components within normal limits  RESP PANEL BY RT-PCR (RSV, FLU A&B, COVID)  [id number]  URINALYSIS, ROUTINE W REFLEX MICROSCOPIC  PROTIME-INR  APTT  PREPARE RBC (CROSSMATCH)  TYPE AND SCREEN    EKG Sinus tachycardia with a rate of 128 bpm.  Normal axis and intervals.  Nonspecific changes without STEMI  RADIOLOGY CXR interpreted by me without evidence of acute cardiopulmonary  pathology.  Official radiology report(s): DG Chest Portable 1 View Result Date: 02/19/2023 CLINICAL DATA:  Multiple falls, weakness EXAM: PORTABLE CHEST 1 VIEW COMPARISON:  08/17/2022 FINDINGS: Single frontal view of the chest demonstrates an unremarkable cardiac silhouette. No acute airspace disease, effusion, or pneumothorax. Prior healed rib fractures are again noted. No acute bony abnormalities. IMPRESSION: 1. No acute intrathoracic process. Electronically Signed   By: Ozell Daring M.D.   On: 02/19/2023 19:43    PROCEDURES and INTERVENTIONS:  .1-3 Lead EKG Interpretation  Performed by: Claudene Rover, MD Authorized by: Claudene Rover, MD     Interpretation: normal     ECG rate:  80   ECG rate assessment: normal     Rhythm: sinus rhythm     Ectopy: none     Conduction: normal   .Critical Care  Performed by: Claudene Rover, MD Authorized by: Claudene Rover, MD   Critical care provider statement:    Critical care time (minutes):  30   Critical care time was exclusive of:   Separately billable procedures and treating other patients   Critical care was necessary to treat or prevent imminent or life-threatening deterioration of the following conditions:  Cardiac failure and circulatory failure   Critical care was time spent personally by me on the following activities:  Development of treatment plan with patient or surrogate, discussions with consultants, evaluation of patient's response to treatment, examination of patient, ordering and review of laboratory studies, ordering and review of radiographic studies, ordering and performing treatments and interventions, pulse oximetry, re-evaluation of patient's condition and review of old charts   Medications  pantoprazole  (PROTONIX ) injection 40 mg (has no administration in time range)  lactated ringers  bolus 1,000 mL (has no administration in time range)     IMPRESSION / MDM / ASSESSMENT AND PLAN / ED COURSE  I reviewed the triage vital signs and the nursing notes.  Differential diagnosis includes, but is not limited to, sepsis, UTI, pneumonia, ICH or stroke, AKI, dehydration, GI bleeding, blood loss anemia  {Patient presents with symptoms of an acute illness or injury that is potentially life-threatening.  Patient presents from home with increasing generalized weakness in the setting of melanotic stools concerning for an upper GI bleed requiring Red cell transfusions and medical admission.  AKI.  Hemoglobin of 6.  Negative viral swabs.  CXR is clear.  We will obtain a CT head prior to expected admission for Natecal workup and GI evaluation.  Started on PPI      FINAL CLINICAL IMPRESSION(S) / ED DIAGNOSES   Final diagnoses:  Generalized weakness  Upper GI bleed  Symptomatic anemia     Rx / DC Orders   ED Discharge Orders     None        Note:  This document was prepared using Dragon voice recognition software and may include unintentional dictation errors.   Claudene Rover, MD 02/19/23 [phone]

## 2023-02-19 NOTE — ED Notes (Signed)
 CCMD called.

## 2023-02-19 NOTE — ED Notes (Signed)
 Pts brief changed. Dark stool noted.

## 2023-02-19 NOTE — ED Triage Notes (Signed)
 First nurse note: pt to ED for generalized weakness x1 week. Decreased appetite. RA SpO2 80s. 96 % on 2 L [place]

## 2023-02-20 ENCOUNTER — Encounter: Payer: Self-pay | Admitting: Internal Medicine

## 2023-02-20 DIAGNOSIS — Y92009 Unspecified place in unspecified non-institutional (private) residence as the place of occurrence of the external cause: Secondary | ICD-10-CM

## 2023-02-20 DIAGNOSIS — E039 Hypothyroidism, unspecified: Secondary | ICD-10-CM | POA: Diagnosis not present

## 2023-02-20 DIAGNOSIS — W19XXXA Unspecified fall, initial encounter: Secondary | ICD-10-CM

## 2023-02-20 DIAGNOSIS — D62 Acute posthemorrhagic anemia: Secondary | ICD-10-CM

## 2023-02-20 DIAGNOSIS — K922 Gastrointestinal hemorrhage, unspecified: Secondary | ICD-10-CM

## 2023-02-20 LAB — GLUCOSE, CAPILLARY
Glucose-Capillary: 107 mg/dL — ABNORMAL HIGH (ref 70–99)
Glucose-Capillary: 120 mg/dL — ABNORMAL HIGH (ref 70–99)
Glucose-Capillary: 157 mg/dL — ABNORMAL HIGH (ref 70–99)
Glucose-Capillary: 87 mg/dL (ref 70–99)
Glucose-Capillary: 88 mg/dL (ref 70–99)

## 2023-02-20 LAB — HEMOGLOBIN AND HEMATOCRIT, BLOOD
HCT: 20 % — ABNORMAL LOW (ref 36.0–46.0)
HCT: 19.9 % — ABNORMAL LOW (ref 36.0–46.0)
Hemoglobin: 6.8 g/dL — ABNORMAL LOW (ref 12.0–15.0)
Hemoglobin: 6.6 g/dL — ABNORMAL LOW (ref 12.0–15.0)

## 2023-02-20 LAB — FERRITIN: Ferritin: 44 ng/mL (ref 11–307)

## 2023-02-20 LAB — BASIC METABOLIC PANEL
Anion gap: 6 (ref 5–15)
BUN: 100 mg/dL — ABNORMAL HIGH (ref 8–23)
CO2: 21 mmol/L — ABNORMAL LOW (ref 22–32)
Calcium: 8.4 mg/dL — ABNORMAL LOW (ref 8.9–10.3)
Chloride: 112 mmol/L — ABNORMAL HIGH (ref 98–111)
Creatinine, Ser: 1.48 mg/dL — ABNORMAL HIGH (ref 0.44–1.00)
GFR, Estimated: 37 mL/min — ABNORMAL LOW (ref >60)
Glucose, Bld: 128 mg/dL — ABNORMAL HIGH (ref 70–99)
Potassium: 4 mmol/L (ref 3.5–5.1)
Sodium: 139 mmol/L (ref 135–145)

## 2023-02-20 LAB — CBC
HCT: 17.9 % — ABNORMAL LOW (ref 36.0–46.0)
Hemoglobin: 6.1 g/dL — ABNORMAL LOW (ref 12.0–15.0)
MCH: 30.3 pg (ref 26.0–34.0)
MCHC: 34.1 g/dL (ref 30.0–36.0)
MCV: 89.1 fL (ref 80.0–100.0)
Platelets: 136 10*3/uL — ABNORMAL LOW (ref 150–400)
RBC: 2.01 MIL/uL — ABNORMAL LOW (ref 3.87–5.11)
RDW: 15.1 % (ref 11.5–15.5)
WBC: 7.4 10*3/uL (ref 4.0–10.5)
nRBC: 0 % (ref 0.0–0.2)

## 2023-02-20 LAB — CBG MONITORING, ED: Glucose-Capillary: 115 mg/dL — ABNORMAL HIGH (ref 70–99)

## 2023-02-20 LAB — IRON AND TIBC
Iron: 102 ug/dL (ref 28–170)
Saturation Ratios: 23 % (ref 10.4–31.8)
TIBC: 452 ug/dL — ABNORMAL HIGH (ref 250–450)
UIBC: 350 ug/dL

## 2023-02-20 LAB — VITAMIN B12: Vitamin B-12: 195 pg/mL (ref 180–914)

## 2023-02-20 LAB — PREPARE RBC (CROSSMATCH)

## 2023-02-20 LAB — FOLATE: Folate: 11 ng/mL (ref >5.9)

## 2023-02-20 MED ORDER — MENTHOL 3 MG MT LOZG: 1.0000 | LOZENGE | OROMUCOSAL | Status: DC | PRN

## 2023-02-20 MED ORDER — SODIUM CHLORIDE 0.9 % IV SOLN: Freq: Once | INTRAVENOUS | Status: DC

## 2023-02-20 MED ORDER — OXYCODONE HCL 5 MG PO TABS
5.0000 mg | ORAL_TABLET | Freq: Four times a day (QID) | ORAL | Status: DC | PRN
  Administered 2023-02-20: 5 mg via ORAL
  Filled 2023-02-20: qty 1

## 2023-02-20 NOTE — Consult Note (Signed)
 Inpatient Consultation   Patient ID: Brittney Ray is a 73 y.o. female.  Requesting Provider: Caleb Exon, MD  Date of Admission: 02/19/2023  Date of Consult: 02/20/23   Reason for Consultation: melena, anemia   Patient's Chief Complaint:   Chief Complaint  Patient presents with   Weakness    73 year old patient with dementia, supranuclear palsy, DM, hiatal hernia, GERD, hypertension, history of breast cancer who presented to the hospital with worsening weakness, melena and generally not feeling well. Due to her dementia information garnered from chart review, sister at bedside, and husband on phone.  Sister at bedside informed me the patient has not been feeling well over the last week.  Over the last 3 to 4 days she has gone on to develop dark tarry looking bowel movements.  The patient notes queasiness/nausea.  Denies vomiting.  The patient's history is noted decreased appetite.  They deny any NSAID use.  Respiratory workup and CT of the head were negative for any acute findings.  Upon presentation her hemoglobin was 6.1.  It was then 5.5 with some hypotension and tachycardia.  This is improved with transfusion, however, her hemoglobin was 6.1 again this morning.  The overnight hospitalist services ordered 2 additional units to be given.  Her last known hemoglobin was 10.1 in October.  She is currently only on 81 mg of aspirin .  Iron studies not supportive of deficiency.  No hematemesis coffee-ground emesis or hematochezia.  The patient does note some abdominal discomfort  Denies NSAIDs, Anti-plt agents, and anticoagulants Denies family history of gastrointestinal disease and malignancy Previous Endoscopies: remote colonoscopy per husband (> 10 years ago)     Past Medical History:  Diagnosis Date   Anxiety    panic attacks; no curren med.   Arthritis    knees   Breast cancer (HCC) 2014   RT LUMPECTOMY DCIS   Diabetes mellitus    NIDDM   GERD (gastroesophageal  reflux disease)    H/O hiatal hernia    Heart murmur    states never had any problems   Hypertension    under control, has been on med. > 10 yrs.   Hypothyroidism    Medial meniscus tear 04/2011   right knee   Nocturia    3-4 x/night   Paget's bone disease    hips   Personal history of radiation therapy 2014   right BREAST CA   PONV (postoperative nausea and vomiting)     Past Surgical History:  Procedure Laterality Date   ABDOMINAL HYSTERECTOMY  1978   partial   BACK SURGERY  04/1990, 1996   BREAST BIOPSY Right 03/14/2012   2 areas UOQ of DCIS   BREAST CYST ASPIRATION Left    BREAST LUMPECTOMY Right 03/29/2012   right breast DCIS of two areas. Clear margins   BURCH PROCEDURE  09/1989   with rectal repair   CHOLECYSTECTOMY  11/2005   FOOT SURGERY  1992   left   HAND SURGERY  10/1999   nerve repair right thumb   KNEE SURGERY  07/31/2009   right   KNEE SURGERY  2009   left   THYROID  LOBECTOMY  03/1992   left    Allergies  Allergen Reactions   Contrast Media [Iodinated Contrast Media] Hives and Cough   Codeine Anxiety   Morphine  And Codeine Other (See Comments)    Tremors   Sulfa Antibiotics Nausea And Vomiting    Family History  Problem Relation Age of  Onset   Breast cancer Mother    Breast cancer Sister 92   Breast cancer Paternal Aunt    Breast cancer Paternal Grandmother    Breast cancer Cousin        maternal side 1st cousin    Social History   Tobacco Use   Smoking status: Never   Smokeless tobacco: Never  Vaping Use   Vaping status: Never Used  Substance Use Topics   Alcohol  use: No   Drug use: Never     Pertinent GI related history and allergies were reviewed with the patient  Review of Systems  Unable to perform ROS: Dementia     Medications Home Medications No current facility-administered medications on file prior to encounter.   Current Outpatient Medications on File Prior to Encounter  Medication Sig Dispense Refill    alendronate (FOSAMAX) 70 MG tablet Take 70 mg by mouth once a week. Take with a full glass of water on an empty stomach.     amLODipine  (NORVASC ) 5 MG tablet Take 5 mg by mouth daily.     aspirin  325 MG tablet Take 325 mg by mouth daily.      atorvastatin  (LIPITOR) 40 MG tablet Take 40 mg by mouth at bedtime.     busPIRone  (BUSPAR ) 5 MG tablet Take 5 mg by mouth 2 (two) times daily.     Calcium  Carb-Cholecalciferol  600-800 MG-UNIT TABS Take 1 tablet by mouth daily with breakfast.      carvedilol  (COREG ) 3.125 MG tablet Take 3.125 mg by mouth 2 (two) times daily with a meal.     escitalopram  (LEXAPRO ) 10 MG tablet Take 1 tablet (10 mg total) by mouth daily. 30 tablet 2   fenofibrate  160 MG tablet Take 1 tablet (160 mg total) by mouth daily. 30 tablet 0   levothyroxine  (SYNTHROID ) 25 MCG tablet Take 25 mcg by mouth daily before breakfast.     lisinopril -hydrochlorothiazide  (ZESTORETIC ) 20-12.5 MG tablet Take 1 tablet by mouth daily.     metFORMIN (GLUCOPHAGE) 500 MG tablet Take 500 mg by mouth at bedtime.     pantoprazole  (PROTONIX ) 40 MG tablet Take 40 mg by mouth daily.      ACCU-CHEK AVIVA PLUS test strip USE 1 STRIP TO CHECK GLUCOSE THREE TIMES DAILY  1   Glucose Blood (ACCU-CHEK AVIVA PLUS VI) 1 each as directed     Pertinent GI related medications were reviewed with the patient  Inpatient Medications  Current Facility-Administered Medications:    0.9 %  sodium chloride  infusion, , Intravenous, Continuous, Niu, Xilin, MD, Last Rate: 75 mL/hr at 02/20/23 0507, Infusion Verify at 02/20/23 0507   0.9 %  sodium chloride  infusion, , Intravenous, Once, Jesus America, NP   acetaminophen  (TYLENOL ) tablet 650 mg, 650 mg, Oral, Q6H PRN, Niu, Xilin, MD   atorvastatin  (LIPITOR) tablet 40 mg, 40 mg, Oral, QHS, Niu, Xilin, MD   busPIRone  (BUSPAR ) tablet 5 mg, 5 mg, Oral, BID, Niu, Xilin, MD   escitalopram  (LEXAPRO ) tablet 10 mg, 10 mg, Oral, Daily, Niu, Xilin, MD   fenofibrate  tablet 160 mg, 160  mg, Oral, Daily, Niu, Xilin, MD   hydrALAZINE  (APRESOLINE ) injection 5 mg, 5 mg, Intravenous, Q2H PRN, Niu, Xilin, MD   insulin  aspart (novoLOG ) injection 0-5 Units, 0-5 Units, Subcutaneous, QHS, Niu, Xilin, MD   insulin  aspart (novoLOG ) injection 0-9 Units, 0-9 Units, Subcutaneous, TID WC, Niu, Xilin, MD   levothyroxine  (SYNTHROID ) tablet 25 mcg, 25 mcg, Oral, Q0600, Niu, Xilin, MD   ondansetron  (ZOFRAN )  injection 4 mg, 4 mg, Intravenous, Q8H PRN, Niu, Xilin, MD   pantoprazole  (PROTONIX ) injection 40 mg, 40 mg, Intravenous, Q12H, Niu, Xilin, MD  Current Outpatient Medications:    alendronate (FOSAMAX) 70 MG tablet, Take 70 mg by mouth once a week. Take with a full glass of water on an empty stomach., Disp: , Rfl:    amLODipine  (NORVASC ) 5 MG tablet, Take 5 mg by mouth daily., Disp: , Rfl:    aspirin  325 MG tablet, Take 325 mg by mouth daily. , Disp: , Rfl:    atorvastatin  (LIPITOR) 40 MG tablet, Take 40 mg by mouth at bedtime., Disp: , Rfl:    busPIRone  (BUSPAR ) 5 MG tablet, Take 5 mg by mouth 2 (two) times daily., Disp: , Rfl:    Calcium  Carb-Cholecalciferol  600-800 MG-UNIT TABS, Take 1 tablet by mouth daily with breakfast. , Disp: , Rfl:    carvedilol  (COREG ) 3.125 MG tablet, Take 3.125 mg by mouth 2 (two) times daily with a meal., Disp: , Rfl:    escitalopram  (LEXAPRO ) 10 MG tablet, Take 1 tablet (10 mg total) by mouth daily., Disp: 30 tablet, Rfl: 2   fenofibrate  160 MG tablet, Take 1 tablet (160 mg total) by mouth daily., Disp: 30 tablet, Rfl: 0   levothyroxine  (SYNTHROID ) 25 MCG tablet, Take 25 mcg by mouth daily before breakfast., Disp: , Rfl:    lisinopril -hydrochlorothiazide  (ZESTORETIC ) 20-12.5 MG tablet, Take 1 tablet by mouth daily., Disp: , Rfl:    metFORMIN (GLUCOPHAGE) 500 MG tablet, Take 500 mg by mouth at bedtime., Disp: , Rfl:    pantoprazole  (PROTONIX ) 40 MG tablet, Take 40 mg by mouth daily. , Disp: , Rfl:    ACCU-CHEK AVIVA PLUS test strip, USE 1 STRIP TO CHECK GLUCOSE  THREE TIMES DAILY, Disp: , Rfl: 1   Glucose Blood (ACCU-CHEK AVIVA PLUS VI), 1 each as directed, Disp: , Rfl:   sodium chloride  75 mL/hr at 02/20/23 0507   sodium chloride       acetaminophen , hydrALAZINE , ondansetron  (ZOFRAN ) IV   Objective   Vitals:   02/20/23 0055 02/20/23 0430 02/20/23 0445 02/20/23 0530  BP: (!) 136/46 126/67 (!) 114/41 (!) 100/35  Pulse: 88 83 80 73  Resp: 20 19 19 16   Temp: 98.8 F (37.1 C)     TempSrc: Oral     SpO2: 100% 100% 100% 100%  Weight:      Height:         Physical Exam Vitals and nursing note reviewed.  Constitutional:      General: She is not in acute distress.    Appearance: She is ill-appearing. She is not toxic-appearing or diaphoretic.  HENT:     Head: Normocephalic and atraumatic.     Nose: Nose normal.     Mouth/Throat:     Mouth: Mucous membranes are moist.     Pharynx: Oropharynx is clear.  Eyes:     General: No scleral icterus.    Extraocular Movements: Extraocular movements intact.  Cardiovascular:     Rate and Rhythm: Normal rate and regular rhythm.  Pulmonary:     Effort: Pulmonary effort is normal. No respiratory distress.     Breath sounds: Normal breath sounds. No wheezing, rhonchi or rales.  Abdominal:     General: Bowel sounds are normal. There is no distension.     Palpations: Abdomen is soft.     Tenderness: There is abdominal tenderness (mild LUQ). There is no guarding or rebound.     Comments: Non  peritoneal  Musculoskeletal:     Cervical back: Neck supple.  Skin:    General: Skin is warm and dry.     Coloration: Skin is pale. Skin is not jaundiced.     Comments: Scattered ecchymosis due to recent falls  Neurological:     Mental Status: She is alert. Mental status is at baseline.     Laboratory Data Recent Labs  Lab 02/19/23 1501 02/19/23 2052 02/20/23 0413  WBC 10.7* 9.6 7.4  HGB 6.1* 5.5* 6.1*  HCT 18.9* 17.3* 17.9*  PLT 216 196 136*   Recent Labs  Lab 02/19/23 1501 02/20/23 0413  NA  139 139  K 4.2 4.0  CL 109 112*  CO2 21* 21*  BUN 99* 100*  CALCIUM  9.3 8.4*  GLUCOSE 156* 128*   Recent Labs  Lab 02/19/23 2032  INR 1.3*    No results for input(s): LIPASE in the last 72 hours.      Imaging Studies: CT HEAD WO CONTRAST ( ) Result Date: 02/19/2023 CLINICAL DATA:  nonfocal altered EXAM: CT HEAD WITHOUT CONTRAST TECHNIQUE: Contiguous axial images were obtained from the base of the skull through the vertex without intravenous contrast. RADIATION DOSE REDUCTION: This exam was performed according to the departmental dose-optimization program which includes automated exposure control, adjustment of the mA and/or kV according to patient size and/or use of iterative reconstruction technique. COMPARISON:  CT head 11/12/2021 FINDINGS: Brain: Patchy and confluent areas of decreased attenuation are noted throughout the deep and periventricular white matter of the cerebral hemispheres bilaterally, compatible with chronic microvascular ischemic disease. No evidence of large-territorial acute infarction. No parenchymal hemorrhage. No mass lesion. No extra-axial collection. No mass effect or midline shift. No hydrocephalus. Basilar cisterns are patent. Vascular: No hyperdense vessel. Atherosclerotic calcifications are present within the cavernous internal and vertebral carotid arteries. Skull: No acute fracture or focal lesion. Sinuses/Orbits: Paranasal sinuses and mastoid air cells are clear. The orbits are unremarkable. Other: None. IMPRESSION: No acute intracranial abnormality. Electronically Signed   By: Morgane  Naveau M.D.   On: 02/19/2023 20:03   DG Chest Portable 1 View Result Date: 02/19/2023 CLINICAL DATA:  Multiple falls, weakness EXAM: PORTABLE CHEST 1 VIEW COMPARISON:  08/17/2022 FINDINGS: Single frontal view of the chest demonstrates an unremarkable cardiac silhouette. No acute airspace disease, effusion, or pneumothorax. Prior healed rib fractures are again noted. No acute  bony abnormalities. IMPRESSION: 1. No acute intrathoracic process. Electronically Signed   By: Ozell Daring M.D.   On: 02/19/2023 19:43    Assessment:  # UGIB -Hemoglobin on presentation 6.1 -Reported melena over the last 3 to 4 days -She is receiving her second and third unit of PRBC -Initially hypotensive and tachycardic on presentation -Iron studies not supportive of chronic bleeding -BUN/creatinine elevated raising concern for upper GI bleeding source -Patient denies any NSAID use.  She is not on anticoagulation.  Only taking ASA 81 mg  # Severe symptomatic anemia 2/2 above  # CAD- on asa 81 mg - asa currently being held  # AKI on CKD  # Multiple recent falls  # DM2  # progressive supranuclear palsy # dementia # h/o breast ca  Plan:  History is purely garnered via chart review and discussion with sister at bedside and husband Alm on the phone Both the husband and her sister have provided consent for undergoing EGD tomorrow.  I have discussed the risks and benefits including the risks of anesthesia in the setting of dementia   Esophagogastroduodenoscopy planned for  tomorrow pending patient stability and endoscopy suite availability NPO with sips and chips now. Full NPO at midnight Labs in am- bmp, cbc, inr Protonix  40 mg iv q12 h Hold dvt ppx. ASA on hold Monitor H&H.  Transfusion and resuscitation as per primary team Avoid frequent lab draws to prevent lab induced anemia Supportive care and antiemetics as per primary team Maintain two sites IV access Avoid nsaids Monitor for GIB.  Should hemodynamic instability develop, recommend stat CTA in case embolization is needed  Esophagogastroduodenoscopy with possible biopsy, control of bleeding, polypectomy, and interventions as necessary has been discussed with the patient/patient representative. Informed consent was obtained from the patient/patient representative after explaining the indication, nature, and risks of  the procedure including but not limited to death, bleeding, perforation, missed neoplasm/lesions, cardiorespiratory compromise, and reaction to medications. Opportunity for questions was given and appropriate answers were provided. Patient/patient representative has verbalized understanding is amenable to undergoing the procedure.  Management of other medical comorbidities as per primary team  I personally performed the service.  Thank you for allowing us  to participate in this patient's care. Please don't hesitate to call if any questions or concerns arise.   [name], DO [facility]  Portions of the record may have been created with voice recognition software. Occasional wrong-word or 'sound-a-like' substitutions may have occurred due to the inherent limitations of voice recognition software.  Read the chart carefully and recognize, using context, where substitutions may have occurred.

## 2023-02-20 NOTE — ED Notes (Signed)
 Patient is resting comfortably.

## 2023-02-20 NOTE — Plan of Care (Signed)
  Problem: Education: Goal: Ability to describe self-care measures that may prevent or decrease complications (Diabetes Survival Skills Education) will improve Outcome: Progressing   Problem: Coping: Goal: Ability to adjust to condition or change in health will improve Outcome: Progressing   Problem: Fluid Volume: Goal: Ability to maintain a balanced intake and output will improve Outcome: Progressing   Problem: Health Behavior/Discharge Planning: Goal: Ability to identify and utilize available resources and services will improve Outcome: Progressing   Problem: Nutritional: Goal: Maintenance of adequate nutrition will improve Outcome: Progressing   Problem: Skin Integrity: Goal: Risk for impaired skin integrity will decrease Outcome: Progressing   Problem: Tissue Perfusion: Goal: Adequacy of tissue perfusion will improve Outcome: Progressing   Problem: Activity: Goal: Risk for activity intolerance will decrease Outcome: Progressing   Problem: Nutrition: Goal: Adequate nutrition will be maintained Outcome: Progressing   Problem: Pain Managment: Goal: General experience of comfort will improve and/or be controlled Outcome: Progressing   Problem: Safety: Goal: Ability to remain free from injury will improve Outcome: Progressing   Problem: Skin Integrity: Goal: Risk for impaired skin integrity will decrease Outcome: Progressing

## 2023-02-20 NOTE — H&P (View-Only) (Signed)
 Inpatient Consultation   Patient ID: Brittney Ray is a 73 y.o. female.  Requesting Provider: Caleb Exon, MD  Date of Admission: 02/19/2023  Date of Consult: 02/20/23   Reason for Consultation: melena, anemia   Patient's Chief Complaint:   Chief Complaint  Patient presents with   Weakness    73 year old patient with dementia, supranuclear palsy, DM, hiatal hernia, GERD, hypertension, history of breast cancer who presented to the hospital with worsening weakness, melena and generally not feeling well. Due to her dementia information garnered from chart review, sister at bedside, and husband on phone.  Sister at bedside informed me the patient has not been feeling well over the last week.  Over the last 3 to 4 days she has gone on to develop dark tarry looking bowel movements.  The patient notes queasiness/nausea.  Denies vomiting.  The patient's history is noted decreased appetite.  They deny any NSAID use.  Respiratory workup and CT of the head were negative for any acute findings.  Upon presentation her hemoglobin was 6.1.  It was then 5.5 with some hypotension and tachycardia.  This is improved with transfusion, however, her hemoglobin was 6.1 again this morning.  The overnight hospitalist services ordered 2 additional units to be given.  Her last known hemoglobin was 10.1 in October.  She is currently only on 81 mg of aspirin .  Iron studies not supportive of deficiency.  No hematemesis coffee-ground emesis or hematochezia.  The patient does note some abdominal discomfort  Denies NSAIDs, Anti-plt agents, and anticoagulants Denies family history of gastrointestinal disease and malignancy Previous Endoscopies: remote colonoscopy per husband (> 10 years ago)     Past Medical History:  Diagnosis Date   Anxiety    panic attacks; no curren med.   Arthritis    knees   Breast cancer (HCC) 2014   RT LUMPECTOMY DCIS   Diabetes mellitus    NIDDM   GERD (gastroesophageal  reflux disease)    H/O hiatal hernia    Heart murmur    states never had any problems   Hypertension    under control, has been on med. > 10 yrs.   Hypothyroidism    Medial meniscus tear 04/2011   right knee   Nocturia    3-4 x/night   Paget's bone disease    hips   Personal history of radiation therapy 2014   right BREAST CA   PONV (postoperative nausea and vomiting)     Past Surgical History:  Procedure Laterality Date   ABDOMINAL HYSTERECTOMY  1978   partial   BACK SURGERY  04/1990, 1996   BREAST BIOPSY Right 03/14/2012   2 areas UOQ of DCIS   BREAST CYST ASPIRATION Left    BREAST LUMPECTOMY Right 03/29/2012   right breast DCIS of two areas. Clear margins   BURCH PROCEDURE  09/1989   with rectal repair   CHOLECYSTECTOMY  11/2005   FOOT SURGERY  1992   left   HAND SURGERY  10/1999   nerve repair right thumb   KNEE SURGERY  07/31/2009   right   KNEE SURGERY  2009   left   THYROID  LOBECTOMY  03/1992   left    Allergies  Allergen Reactions   Contrast Media [Iodinated Contrast Media] Hives and Cough   Codeine Anxiety   Morphine  And Codeine Other (See Comments)    Tremors   Sulfa Antibiotics Nausea And Vomiting    Family History  Problem Relation Age of  Onset   Breast cancer Mother    Breast cancer Sister 92   Breast cancer Paternal Aunt    Breast cancer Paternal Grandmother    Breast cancer Cousin        maternal side 1st cousin    Social History   Tobacco Use   Smoking status: Never   Smokeless tobacco: Never  Vaping Use   Vaping status: Never Used  Substance Use Topics   Alcohol  use: No   Drug use: Never     Pertinent GI related history and allergies were reviewed with the patient  Review of Systems  Unable to perform ROS: Dementia     Medications Home Medications No current facility-administered medications on file prior to encounter.   Current Outpatient Medications on File Prior to Encounter  Medication Sig Dispense Refill    alendronate (FOSAMAX) 70 MG tablet Take 70 mg by mouth once a week. Take with a full glass of water on an empty stomach.     amLODipine  (NORVASC ) 5 MG tablet Take 5 mg by mouth daily.     aspirin  325 MG tablet Take 325 mg by mouth daily.      atorvastatin  (LIPITOR) 40 MG tablet Take 40 mg by mouth at bedtime.     busPIRone  (BUSPAR ) 5 MG tablet Take 5 mg by mouth 2 (two) times daily.     Calcium  Carb-Cholecalciferol  600-800 MG-UNIT TABS Take 1 tablet by mouth daily with breakfast.      carvedilol  (COREG ) 3.125 MG tablet Take 3.125 mg by mouth 2 (two) times daily with a meal.     escitalopram  (LEXAPRO ) 10 MG tablet Take 1 tablet (10 mg total) by mouth daily. 30 tablet 2   fenofibrate  160 MG tablet Take 1 tablet (160 mg total) by mouth daily. 30 tablet 0   levothyroxine  (SYNTHROID ) 25 MCG tablet Take 25 mcg by mouth daily before breakfast.     lisinopril -hydrochlorothiazide  (ZESTORETIC ) 20-12.5 MG tablet Take 1 tablet by mouth daily.     metFORMIN (GLUCOPHAGE) 500 MG tablet Take 500 mg by mouth at bedtime.     pantoprazole  (PROTONIX ) 40 MG tablet Take 40 mg by mouth daily.      ACCU-CHEK AVIVA PLUS test strip USE 1 STRIP TO CHECK GLUCOSE THREE TIMES DAILY  1   Glucose Blood (ACCU-CHEK AVIVA PLUS VI) 1 each as directed     Pertinent GI related medications were reviewed with the patient  Inpatient Medications  Current Facility-Administered Medications:    0.9 %  sodium chloride  infusion, , Intravenous, Continuous, Niu, Xilin, MD, Last Rate: 75 mL/hr at 02/20/23 0507, Infusion Verify at 02/20/23 0507   0.9 %  sodium chloride  infusion, , Intravenous, Once, Jesus America, NP   acetaminophen  (TYLENOL ) tablet 650 mg, 650 mg, Oral, Q6H PRN, Niu, Xilin, MD   atorvastatin  (LIPITOR) tablet 40 mg, 40 mg, Oral, QHS, Niu, Xilin, MD   busPIRone  (BUSPAR ) tablet 5 mg, 5 mg, Oral, BID, Niu, Xilin, MD   escitalopram  (LEXAPRO ) tablet 10 mg, 10 mg, Oral, Daily, Niu, Xilin, MD   fenofibrate  tablet 160 mg, 160  mg, Oral, Daily, Niu, Xilin, MD   hydrALAZINE  (APRESOLINE ) injection 5 mg, 5 mg, Intravenous, Q2H PRN, Niu, Xilin, MD   insulin  aspart (novoLOG ) injection 0-5 Units, 0-5 Units, Subcutaneous, QHS, Niu, Xilin, MD   insulin  aspart (novoLOG ) injection 0-9 Units, 0-9 Units, Subcutaneous, TID WC, Niu, Xilin, MD   levothyroxine  (SYNTHROID ) tablet 25 mcg, 25 mcg, Oral, Q0600, Niu, Xilin, MD   ondansetron  (ZOFRAN )  injection 4 mg, 4 mg, Intravenous, Q8H PRN, Niu, Xilin, MD   pantoprazole  (PROTONIX ) injection 40 mg, 40 mg, Intravenous, Q12H, Niu, Xilin, MD  Current Outpatient Medications:    alendronate (FOSAMAX) 70 MG tablet, Take 70 mg by mouth once a week. Take with a full glass of water on an empty stomach., Disp: , Rfl:    amLODipine  (NORVASC ) 5 MG tablet, Take 5 mg by mouth daily., Disp: , Rfl:    aspirin  325 MG tablet, Take 325 mg by mouth daily. , Disp: , Rfl:    atorvastatin  (LIPITOR) 40 MG tablet, Take 40 mg by mouth at bedtime., Disp: , Rfl:    busPIRone  (BUSPAR ) 5 MG tablet, Take 5 mg by mouth 2 (two) times daily., Disp: , Rfl:    Calcium  Carb-Cholecalciferol  600-800 MG-UNIT TABS, Take 1 tablet by mouth daily with breakfast. , Disp: , Rfl:    carvedilol  (COREG ) 3.125 MG tablet, Take 3.125 mg by mouth 2 (two) times daily with a meal., Disp: , Rfl:    escitalopram  (LEXAPRO ) 10 MG tablet, Take 1 tablet (10 mg total) by mouth daily., Disp: 30 tablet, Rfl: 2   fenofibrate  160 MG tablet, Take 1 tablet (160 mg total) by mouth daily., Disp: 30 tablet, Rfl: 0   levothyroxine  (SYNTHROID ) 25 MCG tablet, Take 25 mcg by mouth daily before breakfast., Disp: , Rfl:    lisinopril -hydrochlorothiazide  (ZESTORETIC ) 20-12.5 MG tablet, Take 1 tablet by mouth daily., Disp: , Rfl:    metFORMIN (GLUCOPHAGE) 500 MG tablet, Take 500 mg by mouth at bedtime., Disp: , Rfl:    pantoprazole  (PROTONIX ) 40 MG tablet, Take 40 mg by mouth daily. , Disp: , Rfl:    ACCU-CHEK AVIVA PLUS test strip, USE 1 STRIP TO CHECK GLUCOSE  THREE TIMES DAILY, Disp: , Rfl: 1   Glucose Blood (ACCU-CHEK AVIVA PLUS VI), 1 each as directed, Disp: , Rfl:   sodium chloride  75 mL/hr at 02/20/23 0507   sodium chloride       acetaminophen , hydrALAZINE , ondansetron  (ZOFRAN ) IV   Objective   Vitals:   02/20/23 0055 02/20/23 0430 02/20/23 0445 02/20/23 0530  BP: (!) 136/46 126/67 (!) 114/41 (!) 100/35  Pulse: 88 83 80 73  Resp: 20 19 19 16   Temp: 98.8 F (37.1 C)     TempSrc: Oral     SpO2: 100% 100% 100% 100%  Weight:      Height:         Physical Exam Vitals and nursing note reviewed.  Constitutional:      General: She is not in acute distress.    Appearance: She is ill-appearing. She is not toxic-appearing or diaphoretic.  HENT:     Head: Normocephalic and atraumatic.     Nose: Nose normal.     Mouth/Throat:     Mouth: Mucous membranes are moist.     Pharynx: Oropharynx is clear.  Eyes:     General: No scleral icterus.    Extraocular Movements: Extraocular movements intact.  Cardiovascular:     Rate and Rhythm: Normal rate and regular rhythm.  Pulmonary:     Effort: Pulmonary effort is normal. No respiratory distress.     Breath sounds: Normal breath sounds. No wheezing, rhonchi or rales.  Abdominal:     General: Bowel sounds are normal. There is no distension.     Palpations: Abdomen is soft.     Tenderness: There is abdominal tenderness (mild LUQ). There is no guarding or rebound.     Comments: Non  peritoneal  Musculoskeletal:     Cervical back: Neck supple.  Skin:    General: Skin is warm and dry.     Coloration: Skin is pale. Skin is not jaundiced.     Comments: Scattered ecchymosis due to recent falls  Neurological:     Mental Status: She is alert. Mental status is at baseline.     Laboratory Data Recent Labs  Lab 02/19/23 1501 02/19/23 2052 02/20/23 0413  WBC 10.7* 9.6 7.4  HGB 6.1* 5.5* 6.1*  HCT 18.9* 17.3* 17.9*  PLT 216 196 136*   Recent Labs  Lab 02/19/23 1501 02/20/23 0413  NA  139 139  K 4.2 4.0  CL 109 112*  CO2 21* 21*  BUN 99* 100*  CALCIUM  9.3 8.4*  GLUCOSE 156* 128*   Recent Labs  Lab 02/19/23 2032  INR 1.3*    No results for input(s): LIPASE in the last 72 hours.      Imaging Studies: CT HEAD WO CONTRAST ( ) Result Date: 02/19/2023 CLINICAL DATA:  nonfocal altered EXAM: CT HEAD WITHOUT CONTRAST TECHNIQUE: Contiguous axial images were obtained from the base of the skull through the vertex without intravenous contrast. RADIATION DOSE REDUCTION: This exam was performed according to the departmental dose-optimization program which includes automated exposure control, adjustment of the mA and/or kV according to patient size and/or use of iterative reconstruction technique. COMPARISON:  CT head 11/12/2021 FINDINGS: Brain: Patchy and confluent areas of decreased attenuation are noted throughout the deep and periventricular white matter of the cerebral hemispheres bilaterally, compatible with chronic microvascular ischemic disease. No evidence of large-territorial acute infarction. No parenchymal hemorrhage. No mass lesion. No extra-axial collection. No mass effect or midline shift. No hydrocephalus. Basilar cisterns are patent. Vascular: No hyperdense vessel. Atherosclerotic calcifications are present within the cavernous internal and vertebral carotid arteries. Skull: No acute fracture or focal lesion. Sinuses/Orbits: Paranasal sinuses and mastoid air cells are clear. The orbits are unremarkable. Other: None. IMPRESSION: No acute intracranial abnormality. Electronically Signed   By: Morgane  Naveau M.D.   On: 02/19/2023 20:03   DG Chest Portable 1 View Result Date: 02/19/2023 CLINICAL DATA:  Multiple falls, weakness EXAM: PORTABLE CHEST 1 VIEW COMPARISON:  08/17/2022 FINDINGS: Single frontal view of the chest demonstrates an unremarkable cardiac silhouette. No acute airspace disease, effusion, or pneumothorax. Prior healed rib fractures are again noted. No acute  bony abnormalities. IMPRESSION: 1. No acute intrathoracic process. Electronically Signed   By: Ozell Daring M.D.   On: 02/19/2023 19:43    Assessment:  # UGIB -Hemoglobin on presentation 6.1 -Reported melena over the last 3 to 4 days -She is receiving her second and third unit of PRBC -Initially hypotensive and tachycardic on presentation -Iron studies not supportive of chronic bleeding -BUN/creatinine elevated raising concern for upper GI bleeding source -Patient denies any NSAID use.  She is not on anticoagulation.  Only taking ASA 81 mg  # Severe symptomatic anemia 2/2 above  # CAD- on asa 81 mg - asa currently being held  # AKI on CKD  # Multiple recent falls  # DM2  # progressive supranuclear palsy # dementia # h/o breast ca  Plan:  History is purely garnered via chart review and discussion with sister at bedside and husband Alm on the phone Both the husband and her sister have provided consent for undergoing EGD tomorrow.  I have discussed the risks and benefits including the risks of anesthesia in the setting of dementia   Esophagogastroduodenoscopy planned for  tomorrow pending patient stability and endoscopy suite availability NPO with sips and chips now. Full NPO at midnight Labs in am- bmp, cbc, inr Protonix  40 mg iv q12 h Hold dvt ppx. ASA on hold Monitor H&H.  Transfusion and resuscitation as per primary team Avoid frequent lab draws to prevent lab induced anemia Supportive care and antiemetics as per primary team Maintain two sites IV access Avoid nsaids Monitor for GIB.  Should hemodynamic instability develop, recommend stat CTA in case embolization is needed  Esophagogastroduodenoscopy with possible biopsy, control of bleeding, polypectomy, and interventions as necessary has been discussed with the patient/patient representative. Informed consent was obtained from the patient/patient representative after explaining the indication, nature, and risks of  the procedure including but not limited to death, bleeding, perforation, missed neoplasm/lesions, cardiorespiratory compromise, and reaction to medications. Opportunity for questions was given and appropriate answers were provided. Patient/patient representative has verbalized understanding is amenable to undergoing the procedure.  Management of other medical comorbidities as per primary team  I personally performed the service.  Thank you for allowing us  to participate in this patient's care. Please don't hesitate to call if any questions or concerns arise.   [name], DO [facility]  Portions of the record may have been created with voice recognition software. Occasional wrong-word or 'sound-a-like' substitutions may have occurred due to the inherent limitations of voice recognition software.  Read the chart carefully and recognize, using context, where substitutions may have occurred.

## 2023-02-20 NOTE — Progress Notes (Signed)
 OT Cancellation Note  Patient Details Name: Brittney Ray MRN: [id number] DOB: [birth year]   Cancelled Treatment:    Reason Eval/Treat Not Completed: Patient not medically ready for rehab services at this time, with most recent Hgb 6.1 and transfusion in progress.  Will attempt to see pt at a future date/time as medically appropriate.   Suzen Hock 02/20/2023, 10:38 AM

## 2023-02-20 NOTE — Plan of Care (Signed)
  Problem: Pain Managment: Goal: General experience of comfort will improve and/or be controlled Outcome: Progressing   Problem: Safety: Goal: Ability to remain free from injury will improve Outcome: Progressing   Problem: Skin Integrity: Goal: Risk for impaired skin integrity will decrease Outcome: Progressing   Problem: Education: Goal: Ability to describe self-care measures that may prevent or decrease complications (Diabetes Survival Skills Education) will improve Outcome: Not Progressing   Problem: Coping: Goal: Ability to adjust to condition or change in health will improve Outcome: Not Progressing   Problem: Fluid Volume: Goal: Ability to maintain a balanced intake and output will improve Outcome: Not Progressing   Problem: Health Behavior/Discharge Planning: Goal: Ability to identify and utilize available resources and services will improve Outcome: Not Progressing Goal: Ability to manage health-related needs will improve Outcome: Not Progressing   Problem: Metabolic: Goal: Ability to maintain appropriate glucose levels will improve Outcome: Not Progressing   Problem: Nutritional: Goal: Maintenance of adequate nutrition will improve Outcome: Not Progressing Goal: Progress toward achieving an optimal weight will improve Outcome: Not Progressing   Problem: Skin Integrity: Goal: Risk for impaired skin integrity will decrease Outcome: Not Progressing   Problem: Tissue Perfusion: Goal: Adequacy of tissue perfusion will improve Outcome: Not Progressing   Problem: Education: Goal: Knowledge of General Education information will improve Description: Including pain rating scale, medication(s)/side effects and non-pharmacologic comfort measures Outcome: Not Progressing   Problem: Health Behavior/Discharge Planning: Goal: Ability to manage health-related needs will improve Outcome: Not Progressing   Problem: Clinical Measurements: Goal: Ability to maintain  clinical measurements within normal limits will improve Outcome: Not Progressing Goal: Will remain free from infection Outcome: Not Progressing Goal: Diagnostic test results will improve Outcome: Not Progressing Goal: Respiratory complications will improve Outcome: Not Progressing Goal: Cardiovascular complication will be avoided Outcome: Not Progressing   Problem: Activity: Goal: Risk for activity intolerance will decrease Outcome: Not Progressing   Problem: Nutrition: Goal: Adequate nutrition will be maintained Outcome: Not Progressing   Problem: Coping: Goal: Level of anxiety will decrease Outcome: Not Progressing   Problem: Elimination: Goal: Will not experience complications related to bowel motility Outcome: Not Progressing Goal: Will not experience complications related to urinary retention Outcome: Not Progressing   Problem: Education: Goal: Individualized Educational Video(s) Outcome: Not Applicable

## 2023-02-20 NOTE — Progress Notes (Signed)
 PT Cancellation Note  Patient Details Name: Brittney Ray MRN: [id number] DOB: Mar 27, 1950   Cancelled Treatment:    Reason Eval/Treat Not Completed: Patient not medically ready with most recent Hgb 6.1 and transfusion in progress.  Will attempt to see pt at a future date/time as medically appropriate.    CHARM Glendia Bertin PT, DPT 02/20/23, 10:36 AM

## 2023-02-20 NOTE — ED Notes (Signed)
 MD at bedside. Speaking with Family.

## 2023-02-20 NOTE — ED Notes (Signed)
 Called pharmacy to verify about blood infiltrating at left [facility] site. Pharmacy recommended RN elevate arm. Blood switched to right AC.

## 2023-02-20 NOTE — Care Management Obs Status (Signed)
 MEDICARE OBSERVATION STATUS NOTIFICATION   Patient Details  Name: Brittney Ray MRN: [id number] Date of Birth: 1950/11/15   Medicare Observation Status Notification Given:       Areta Beer, RN 02/20/2023, 5:08 PM

## 2023-02-20 NOTE — Progress Notes (Addendum)
 [facility]  PROGRESS NOTE  Brittney Ray FMW:[id number] DOB: 12-13-1950 DOA: 02/19/2023 PCP: Alla Amis, MD   Brief HPI:   73 y.o. female with medical history significant of progressive supranuclear palsy, hypertension, hyperlipidemia, diabetes mellitus, CAD, TIA, hypothyroidism, depression with anxiety, CKD-3A, Paget bone disease, breast cancer (Brittney/p of right lumpectomy, radiation therapy), who presents with weakness, dark stool and fall.   pt was found to have hemoglobin dropped from 10.1 on 11/05/2022--> 6.1 today, WBC 10.7, worsening renal function, negative PCR for COVID, flu and RSV, blood pressure 80/50, which improved to 92/67 after giving 1 L LR bolus, heart rate of 128 -> 87, RR 16, oxygen saturation 92% on 2 L oxygen.  Chest x-ray negative.  CT of head is negative for acute intracranial abnormalities.  Patient is placed on telemetry bed observation. Dr. Jinny of GI is consulted.    Assessment/Plan:   GI bleeding and acute blood loss anemia:  -Hb  10.1 --> 6.1. Consulted Dr. Jinny of GI.   -Hold aspirin  -2 units of PRBC ordered this morning -Will likely need EGD -Avoid NSAID and heparin -Follow CBC in a.m.    Fall at home, initial encounter:  CT-head negative -fall precaution -PT/OT   Acquired hypothyroidism -Synthroid    Acute renal failure superimposed on stage 3a chronic kidney disease (HCC):  Recent baseline creatinine 1.32 on 11/05/2022.  Her creatinine is 1.90,  -Improved to 1.48   Essential hypertension -Hold amlodipine , Coreg , Zestoretic  due to hypotension -IV hydralazine  as needed if blood pressure is elevated.   Type II diabetes mellitus with renal manifestations (HCC):  Recent A1c 5.0 on 08/24/2021, well-controlled.  Patient taking metformin -Sliding scale insulin  with NovoLog    Mixed hyperlipidemia -Lipitor -Tricor    TIA (transient ischemic attack) -Hold aspirin  -Lipitor   Coronary artery disease: No chest pain -Hold  aspirin  -Lipitor   Depression with anxiety -Continue home medications      Medications     atorvastatin   40 mg Oral QHS   busPIRone   5 mg Oral BID   escitalopram   10 mg Oral Daily   fenofibrate   160 mg Oral Daily   insulin  aspart  0-5 Units Subcutaneous QHS   insulin  aspart  0-9 Units Subcutaneous TID WC   levothyroxine   25 mcg Oral Q0600   pantoprazole  (PROTONIX ) IV  40 mg Intravenous Q12H     Data Reviewed:   CBG:  Recent Labs  Lab 02/19/23 1509 02/19/23 2253 02/20/23 0821  GLUCAP 148* 112* 115*    SpO2: 100 % O2 Flow Rate (L/min): 2 L/min    Vitals:   02/20/23 0530 02/20/23 0721 02/20/23 0738 02/20/23 0801  BP: (!) 100/35  (!) 116/55 130/67  Pulse: 73  88 84  Resp: 16  16 15   Temp:  98.1 F (36.7 C) 98.1 F (36.7 C) 98.3 F (36.8 C)  TempSrc:  Oral Oral   SpO2: 100%  100% 100%  Weight:      Height:          Data Reviewed:  Basic Metabolic Panel: Recent Labs  Lab 02/19/23 1501 02/20/23 0413  NA 139 139  K 4.2 4.0  CL 109 112*  CO2 21* 21*  GLUCOSE 156* 128*  BUN 99* 100*  CREATININE 1.90* 1.48*  CALCIUM  9.3 8.4*    CBC: Recent Labs  Lab 02/19/23 1501 02/19/23 2052 02/20/23 0413  WBC 10.7* 9.6 7.4  HGB 6.1* 5.5* 6.1*  HCT 18.9* 17.3* 17.9*  MCV 90.0 91.5 89.1  PLT 216 196  136*    LFT No results for input(Brittney): AST, ALT, ALKPHOS, BILITOT, PROT, ALBUMIN in the last 168 hours.   Antibiotics: Anti-infectives (From admission, onward)    None        DVT prophylaxis: SCDs  Code Status: DNR  Family Communication: No family at bedside   CONSULTS gastroenterology   Subjective   Denies any complaints   Objective    Physical Examination:   General-appears in no acute distress Heart-S1-S2, regular, no murmur auscultated Lungs-clear to auscultation bilaterally, no wheezing or crackles auscultated Abdomen-soft, nontender, no organomegaly Extremities-no edema in the lower extremities Neuro-alert,  oriented x3, no focal deficit noted  Status is: Inpatient:             Brittney Ray Brittney Ray   [facility] If 7PM-7AM, please contact night-coverage at [email or web address], Office  [phone]   02/20/2023, 8:30 AM  LOS: 0 days

## 2023-02-21 ENCOUNTER — Observation Stay: Payer: Medicare Other | Admitting: General Practice

## 2023-02-21 ENCOUNTER — Encounter: Payer: Self-pay | Admitting: Internal Medicine

## 2023-02-21 ENCOUNTER — Encounter
Admission: EM | Disposition: A | Discharge status: Home Health | Payer: Self-pay | Source: Home / Self Care | Attending: Family Medicine

## 2023-02-21 DIAGNOSIS — I2510 Atherosclerotic heart disease of native coronary artery without angina pectoris: Secondary | ICD-10-CM | POA: Diagnosis present

## 2023-02-21 DIAGNOSIS — G231 Progressive supranuclear ophthalmoplegia [Steele-Richardson-Olszewski]: Secondary | ICD-10-CM | POA: Diagnosis present

## 2023-02-21 DIAGNOSIS — Z79899 Other long term (current) drug therapy: Secondary | ICD-10-CM | POA: Diagnosis not present

## 2023-02-21 DIAGNOSIS — Z91041 Radiographic dye allergy status: Secondary | ICD-10-CM | POA: Diagnosis not present

## 2023-02-21 DIAGNOSIS — N1831 Chronic kidney disease, stage 3a: Secondary | ICD-10-CM | POA: Diagnosis present

## 2023-02-21 DIAGNOSIS — K922 Gastrointestinal hemorrhage, unspecified: Secondary | ICD-10-CM | POA: Diagnosis not present

## 2023-02-21 DIAGNOSIS — E782 Mixed hyperlipidemia: Secondary | ICD-10-CM | POA: Diagnosis present

## 2023-02-21 DIAGNOSIS — Z79890 Hormone replacement therapy (postmenopausal): Secondary | ICD-10-CM | POA: Diagnosis not present

## 2023-02-21 DIAGNOSIS — K2101 Gastro-esophageal reflux disease with esophagitis, with bleeding: Secondary | ICD-10-CM | POA: Diagnosis present

## 2023-02-21 DIAGNOSIS — E1122 Type 2 diabetes mellitus with diabetic chronic kidney disease: Secondary | ICD-10-CM | POA: Diagnosis present

## 2023-02-21 DIAGNOSIS — I129 Hypertensive chronic kidney disease with stage 1 through stage 4 chronic kidney disease, or unspecified chronic kidney disease: Secondary | ICD-10-CM | POA: Diagnosis present

## 2023-02-21 DIAGNOSIS — F418 Other specified anxiety disorders: Secondary | ICD-10-CM | POA: Diagnosis not present

## 2023-02-21 DIAGNOSIS — K279 Peptic ulcer, site unspecified, unspecified as acute or chronic, without hemorrhage or perforation: Secondary | ICD-10-CM | POA: Diagnosis not present

## 2023-02-21 DIAGNOSIS — Z66 Do not resuscitate: Secondary | ICD-10-CM | POA: Diagnosis present

## 2023-02-21 DIAGNOSIS — Z7984 Long term (current) use of oral hypoglycemic drugs: Secondary | ICD-10-CM | POA: Diagnosis not present

## 2023-02-21 DIAGNOSIS — Z7983 Long term (current) use of bisphosphonates: Secondary | ICD-10-CM | POA: Diagnosis not present

## 2023-02-21 DIAGNOSIS — Z1152 Encounter for screening for COVID-19: Secondary | ICD-10-CM | POA: Diagnosis not present

## 2023-02-21 DIAGNOSIS — W19XXXA Unspecified fall, initial encounter: Secondary | ICD-10-CM | POA: Diagnosis present

## 2023-02-21 DIAGNOSIS — Z7982 Long term (current) use of aspirin: Secondary | ICD-10-CM | POA: Diagnosis not present

## 2023-02-21 DIAGNOSIS — F0393 Unspecified dementia, unspecified severity, with mood disturbance: Secondary | ICD-10-CM | POA: Diagnosis present

## 2023-02-21 DIAGNOSIS — K254 Chronic or unspecified gastric ulcer with hemorrhage: Secondary | ICD-10-CM | POA: Diagnosis present

## 2023-02-21 DIAGNOSIS — K921 Melena: Secondary | ICD-10-CM | POA: Diagnosis present

## 2023-02-21 DIAGNOSIS — E039 Hypothyroidism, unspecified: Secondary | ICD-10-CM | POA: Diagnosis present

## 2023-02-21 DIAGNOSIS — K269 Duodenal ulcer, unspecified as acute or chronic, without hemorrhage or perforation: Secondary | ICD-10-CM | POA: Diagnosis not present

## 2023-02-21 DIAGNOSIS — Y92009 Unspecified place in unspecified non-institutional (private) residence as the place of occurrence of the external cause: Secondary | ICD-10-CM | POA: Diagnosis not present

## 2023-02-21 DIAGNOSIS — N179 Acute kidney failure, unspecified: Secondary | ICD-10-CM | POA: Diagnosis present

## 2023-02-21 DIAGNOSIS — F0394 Unspecified dementia, unspecified severity, with anxiety: Secondary | ICD-10-CM | POA: Diagnosis present

## 2023-02-21 DIAGNOSIS — E860 Dehydration: Secondary | ICD-10-CM | POA: Diagnosis present

## 2023-02-21 DIAGNOSIS — K264 Chronic or unspecified duodenal ulcer with hemorrhage: Secondary | ICD-10-CM | POA: Diagnosis present

## 2023-02-21 DIAGNOSIS — R296 Repeated falls: Secondary | ICD-10-CM | POA: Diagnosis present

## 2023-02-21 DIAGNOSIS — D62 Acute posthemorrhagic anemia: Secondary | ICD-10-CM | POA: Diagnosis present

## 2023-02-21 HISTORY — PX: ESOPHAGOGASTRODUODENOSCOPY (EGD) WITH PROPOFOL: SHX5813

## 2023-02-21 HISTORY — PX: BIOPSY: SHX5522

## 2023-02-21 LAB — BASIC METABOLIC PANEL
Anion gap: 8 (ref 5–15)
BUN: 85 mg/dL — ABNORMAL HIGH (ref 8–23)
CO2: 19 mmol/L — ABNORMAL LOW (ref 22–32)
Calcium: 8 mg/dL — ABNORMAL LOW (ref 8.9–10.3)
Chloride: 113 mmol/L — ABNORMAL HIGH (ref 98–111)
Creatinine, Ser: 1.36 mg/dL — ABNORMAL HIGH (ref 0.44–1.00)
GFR, Estimated: 41 mL/min — ABNORMAL LOW (ref >60)
Glucose, Bld: 89 mg/dL (ref 70–99)
Potassium: 4.2 mmol/L (ref 3.5–5.1)
Sodium: 140 mmol/L (ref 135–145)

## 2023-02-21 LAB — CBC
HCT: 18.9 % — ABNORMAL LOW (ref 36.0–46.0)
Hemoglobin: 6.3 g/dL — ABNORMAL LOW (ref 12.0–15.0)
MCH: 30.3 pg (ref 26.0–34.0)
MCHC: 33.3 g/dL (ref 30.0–36.0)
MCV: 90.9 fL (ref 80.0–100.0)
Platelets: 160 10*3/uL (ref 150–400)
RBC: 2.08 MIL/uL — ABNORMAL LOW (ref 3.87–5.11)
RDW: 15.8 % — ABNORMAL HIGH (ref 11.5–15.5)
WBC: 7.6 10*3/uL (ref 4.0–10.5)
nRBC: 0 % (ref 0.0–0.2)

## 2023-02-21 LAB — HEMOGLOBIN AND HEMATOCRIT, BLOOD
HCT: 26.5 % — ABNORMAL LOW (ref 36.0–46.0)
Hemoglobin: 9.4 g/dL — ABNORMAL LOW (ref 12.0–15.0)

## 2023-02-21 LAB — GLUCOSE, CAPILLARY
Glucose-Capillary: 95 mg/dL (ref 70–99)
Glucose-Capillary: 83 mg/dL (ref 70–99)
Glucose-Capillary: 93 mg/dL (ref 70–99)
Glucose-Capillary: 178 mg/dL — ABNORMAL HIGH (ref 70–99)
Glucose-Capillary: 146 mg/dL — ABNORMAL HIGH (ref 70–99)

## 2023-02-21 LAB — PREPARE RBC (CROSSMATCH)

## 2023-02-21 LAB — PROTIME-INR
INR: 1.3 — ABNORMAL HIGH (ref 0.8–1.2)
Prothrombin Time: 16.7 s — ABNORMAL HIGH (ref 11.4–15.2)

## 2023-02-21 SURGERY — ESOPHAGOGASTRODUODENOSCOPY (EGD) WITH PROPOFOL
Anesthesia: General

## 2023-02-21 MED ORDER — SODIUM CHLORIDE 0.9 % IV SOLN: INTRAVENOUS | Status: DC

## 2023-02-21 MED ORDER — PROPOFOL 500 MG/50ML IV EMUL
INTRAVENOUS | Status: DC | PRN
  Administered 2023-02-21: 100 ug/kg/min via INTRAVENOUS

## 2023-02-21 MED ORDER — SODIUM CHLORIDE 0.9% IV SOLUTION: Freq: Once | INTRAVENOUS | Status: AC

## 2023-02-21 MED ORDER — FENTANYL CITRATE PF 50 MCG/ML IJ SOSY
6.2500 ug | PREFILLED_SYRINGE | Freq: Once | INTRAMUSCULAR | Status: AC
  Administered 2023-02-21: 6.5 ug via INTRAVENOUS
  Filled 2023-02-21: qty 1

## 2023-02-21 MED ORDER — CARVEDILOL 3.125 MG PO TABS
3.1250 mg | ORAL_TABLET | Freq: Two times a day (BID) | ORAL | Status: DC
  Administered 2023-02-21 – 2023-02-22 (×2): 3.125 mg via ORAL
  Filled 2023-02-21 (×2): qty 1

## 2023-02-21 MED ORDER — PROPOFOL 10 MG/ML IV BOLUS
INTRAVENOUS | Status: DC | PRN
  Administered 2023-02-21: 70 mg via INTRAVENOUS

## 2023-02-21 NOTE — Progress Notes (Signed)
 OT Cancellation Note  Patient Details Name: Brittney Ray MRN: [id number] DOB: 1950-06-01   Cancelled Treatment:    Reason Eval/Treat Not Completed: Medical issues which prohibited therapy. Chart reviewed, pt with Hgb of 6.3 and below guidelines for rehab services. Will continue to follow and attempt to see pt at later date/time as medically appropriate.  Cesar Alf L. Patryk Conant, OTR/L  02/21/23, 8:14 AM

## 2023-02-21 NOTE — Op Note (Signed)
 [facility] Gastroenterology Patient Name: Brittney Ray Procedure Date: 02/21/2023 10:13 AM MRN: [id number] Account #: [id number] Date of Birth: 1950-07-20 Admit Type: Inpatient Age: 73 Room: [facility] ENDO ROOM 4 Gender: Female Note Status: Finalized Instrument Name: Upper Endoscope [id number] Procedure:             Upper GI endoscopy Indications:           Acute post hemorrhagic anemia, Melena Providers:             [name] DO, DO Medicines:             Monitored Anesthesia Care Complications:         No immediate complications. Estimated blood loss:                         Minimal. Procedure:             Pre-Anesthesia Assessment:                        - Prior to the procedure, a History and Physical was                         performed, and patient medications and allergies were                         reviewed. The patient is competent. The risks and                         benefits of the procedure and the sedation options and                         risks were discussed with the patient. All questions                         were answered and informed consent was obtained.                         Patient identification and proposed procedure were                         verified by the physician, the nurse, the anesthetist                         and the technician in the endoscopy suite. Mental                         Status Examination: alert and oriented. Airway                         Examination: normal oropharyngeal airway and neck                         mobility. Respiratory Examination: clear to                         auscultation. CV Examination: RRR, no murmurs, no S3                         or S4. Prophylactic Antibiotics: The  patient does not                         require prophylactic antibiotics. Prior                         Anticoagulants: The patient has taken no anticoagulant                         or antiplatelet agents.  ASA Grade Assessment: III - A                         patient with severe systemic disease. After reviewing                         the risks and benefits, the patient was deemed in                         satisfactory condition to undergo the procedure. The                         anesthesia plan was to use monitored anesthesia care                         (MAC). Immediately prior to administration of                         medications, the patient was re-assessed for adequacy                         to receive sedatives. The heart rate, respiratory                         rate, oxygen saturations, blood pressure, adequacy of                         pulmonary ventilation, and response to care were                         monitored throughout the procedure. The physical                         status of the patient was re-assessed after the                         procedure.                        After obtaining informed consent, the endoscope was                         passed under direct vision. Throughout the procedure,                         the patient's blood pressure, pulse, and oxygen                         saturations were monitored continuously. The Endoscope  was introduced through the mouth, and advanced to the                         third part of duodenum. The upper GI endoscopy was                         accomplished without difficulty. The patient tolerated                         the procedure well. Findings:      One non-bleeding cratered duodenal ulcer was found in the second portion       of the duodenum. Ulcer location on posterolateral wall and was difficult       to assess. Could not appreciate if there was any visible vessel or not       based on location. However, there were no signs of fresh or old blood       within the duodenal lumen. Estimated blood loss: none.      The exam of the duodenum was otherwise normal.      Four  non-bleeding superficial gastric ulcers with a clean ulcer base       (Forrest Class III) were found in the gastric antrum. The largest lesion       was 2 mm in largest dimension. Biopsies were taken with a cold forceps       for Helicobacter pylori testing. Estimated blood loss was minimal.      The exam of the stomach was otherwise normal.      The Z-line was regular. Estimated blood loss: none.      Esophagogastric landmarks were identified: the gastroesophageal junction       was found at 35 cm from the incisors.      LA Grade A (one or more mucosal breaks less than 5 mm, not extending       between tops of 2 mucosal folds) esophagitis with no bleeding was found       35 cm from the incisors. Estimated blood loss: none.      The exam of the esophagus was otherwise normal. Impression:            - Non-bleeding duodenal ulcer.                        - Non-bleeding gastric ulcers with a clean ulcer base                         (Forrest Class III). Biopsied.                        - Z-line regular.                        - Esophagogastric landmarks identified.                        - LA Grade A esophagitis with no bleeding. Recommendation:        - Patient has a contact number available for                         emergencies. The signs and symptoms of potential  delayed complications were discussed with the patient.                         Return to normal activities tomorrow. Written                         discharge instructions were provided to the patient.                        - Return patient to hospital ward for ongoing care.                        - Full liquid diet today.                        - Continue present medications.                        - Continue twice daily iv protonix  40 mg. She will                         need po protonix  40 mg twice a day for 8 weeks total.                        If she continues to have lack of response to prbcs,                          consider tagged RBC or CTA to evaluate for other                         sources of bleeding.                        Given the location of duodenal ulcer, this would be                         difficult to treat endoscopically aside from                         temporizing measures such as hemaspray. She would                         still need definitive treatment such as embolization                         should she rebleed from this area.                        Hemaspray was not applied during this procedure as                         there did not appear to be active bleeding and no                         signs of fresh/old blood.                        - Await pathology results.                        -  The findings and recommendations were discussed with                         the patient's family.                        - The findings and recommendations were discussed with                         the referring physician.                        - The findings and recommendations were discussed with                         the patient. Procedure Code(s):     --- Professional ---                        [phone], Esophagogastroduodenoscopy, flexible,                         transoral; with biopsy, single or multiple Diagnosis Code(s):     --- Professional ---                        K26.9, Duodenal ulcer, unspecified as acute or                         chronic, without hemorrhage or perforation                        K25.9, Gastric ulcer, unspecified as acute or chronic,                         without hemorrhage or perforation                        K20.90, Esophagitis, unspecified without bleeding                        D62, Acute posthemorrhagic anemia                        K92.1, Melena (includes Hematochezia) CPT copyright 2022 American [employer] Association. All rights reserved. The codes documented in this report are preliminary and upon coder review may  be  revised to meet current compliance requirements. Attending Participation:      I personally performed the entire procedure. Elspeth Jungling, DO [name] DO, DO 02/21/2023 10:36:26 AM This report has been signed electronically. Number of Addenda: 0 Note Initiated On: 02/21/2023 10:13 AM Estimated Blood Loss:  Estimated blood loss was minimal.      [facility]

## 2023-02-21 NOTE — Interval H&P Note (Signed)
 History and Physical Interval Note: Consult note from 02/20/23 was reviewed after seeing and examining the patient. Patient is reporting pain in the RLQ now. No tenderness to palpation in the upper quadrants. She has required additional units of prbc while in the hospital. Finishing one of two prior to procedure. Despite the 3 units yesterday, her hgb remained unchanged, however, she did not show signs of other bleeding- normotensive, regular heart rate, no n/v or further diarrhea/melena. I discussed the procedure with her husband and sister yesterday. Consent was obtained from her husband over the phone.  Written consent was obtained from the patient's husband after discussion of risks, benefits, and alternatives. Patient's husband has consented for patient to proceed with Esophagogastroduodenoscopy with possible intervention   02/21/2023 9:59 AM  Brittney Ray  has presented today for surgery, with the diagnosis of melena, anemia.  The various methods of treatment have been discussed with the patient and family. After consideration of risks, benefits and other options for treatment, the patient has consented to  Procedure(s): ESOPHAGOGASTRODUODENOSCOPY (EGD) WITH PROPOFOL  (N/A) as a surgical intervention.  The patient's history has been reviewed, patient examined, no change in status, stable for surgery.  I have reviewed the patient's chart and labs.  Questions were answered to the patient's and representatives' satisfaction.     [name]

## 2023-02-21 NOTE — Interval H&P Note (Signed)
**  Correction from previous interval note- Pain is in LLQ, not RLQ. Brittney Newton, DO [facility] Gastroenterology

## 2023-02-21 NOTE — Anesthesia Procedure Notes (Signed)
 Date/Time: 02/21/2023 10:12 AM  Performed by: Tod Handing, CRNAPre-anesthesia Checklist: Patient identified, Emergency Drugs available, Suction available and Patient being monitored Patient Re-evaluated:Patient Re-evaluated prior to induction Oxygen Delivery Method: Nasal cannula Induction Type: IV induction Dental Injury: Teeth and Oropharynx as per pre-operative assessment  Comments: Nasal cannula with etCO2 monitoring

## 2023-02-21 NOTE — Anesthesia Preprocedure Evaluation (Signed)
 Anesthesia Evaluation  Patient identified by MRN, date of birth, ID band Patient awake and Patient confused    Reviewed: Allergy & Precautions, H&P , NPO status , Patient's Chart, lab work & pertinent test results  Airway Mallampati: I  TM Distance: >3 FB Neck ROM: Full    Dental  (+) Teeth Intact, Dental Advisory Given   Pulmonary neg pulmonary ROS   Pulmonary exam normal breath sounds clear to auscultation       Cardiovascular hypertension, On Medications + CAD  Normal cardiovascular exam Rhythm:Regular     Neuro/Psych  PSYCHIATRIC DISORDERS Anxiety Depression   Dementia TIA   GI/Hepatic Neg liver ROS,GERD  Medicated and Controlled,,  Endo/Other  diabetes, Well Controlled, Type 2, Oral Hypoglycemic AgentsHypothyroidism    Renal/GU CRFRenal disease  negative genitourinary   Musculoskeletal   Abdominal   Peds  Hematology  (+) Blood dyscrasia, anemia   Anesthesia Other Findings Past Medical History: No date: Anxiety     Comment:  panic attacks; no curren med. No date: Arthritis     Comment:  knees 2014: Breast cancer (HCC)     Comment:  RT LUMPECTOMY DCIS No date: Diabetes mellitus     Comment:  NIDDM No date: GERD (gastroesophageal reflux disease) No date: H/O hiatal hernia No date: Heart murmur     Comment:  states never had any problems No date: Hypertension     Comment:  under control, has been on med. > 10 yrs. 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 Clear margins 09/1989: BURCH PROCEDURE     Comment:  with rectal repair 11/2005: CHOLECYSTECTOMY 1992: FOOT SURGERY     Comment:  left 10/1999: HAND SURGERY     Comment:  nerve repair right thumb 07/31/2009: KNEE SURGERY     Comment:  right 2009: KNEE SURGERY     Comment:  left 03/1992: THYROID  LOBECTOMY     Comment:  left  BMI    Body Mass Index: 27.78 kg/m      Reproductive/Obstetrics negative OB ROS                             Anesthesia Physical Anesthesia Plan  ASA: 4  Anesthesia Plan: General   Post-op Pain Management: Minimal or no pain anticipated   Induction: Intravenous  PONV Risk Score and Plan: 3 and Propofol  infusion, TIVA and Ondansetron   Airway Management Planned: Nasal Cannula  Additional Equipment: None  Intra-op Plan:   Post-operative Plan:   Informed Consent: I have reviewed the patients History and Physical, chart, labs and discussed the procedure including the risks, benefits and alternatives for the proposed anesthesia with the patient or authorized representative who has indicated his/her understanding and acceptance.     Dental advisory given and Consent reviewed with POA  Plan Discussed with: CRNA and Surgeon  Anesthesia Plan Comments: (Discussed risks of anesthesia with patient's husband, including possibility of difficulty with spontaneous ventilation under anesthesia necessitating airway intervention, PONV, and rare risks such as cardiac or respiratory or neurological events, and allergic reactions. Discussed the role of CRNA in patient's perioperative care. Patient's husband understands.)       Anesthesia Quick Evaluation

## 2023-02-21 NOTE — Progress Notes (Signed)
 No intervention needed at this time. Patient is comfortable without any complaints  02/21/23 0015  Assess: MEWS Score  Temp 98.5 F (36.9 C)  BP (!) 133/54  MAP (mmHg) 76  Pulse Rate (!) 104  Resp 16  SpO2 98 %  O2 Device Room Air  Assess: MEWS Score  MEWS Temp 0  MEWS Systolic 0  MEWS Pulse 1  MEWS RR 0  MEWS LOC 2  MEWS Score 3  MEWS Score Color Yellow  Assess: if the MEWS score is Yellow or Red  Were vital signs accurate and taken at a resting state? Yes  Does the patient meet 2 or more of the SIRS criteria? No  MEWS guidelines implemented  Yes, yellow  Treat  MEWS Interventions Considered administering scheduled or prn medications/treatments as ordered  Take Vital Signs  Increase Vital Sign Frequency  Yellow: Q2hr x1, continue Q4hrs until patient remains green for 12hrs  Escalate  MEWS: Escalate Yellow: Discuss with charge nurse and consider notifying provider and/or RRT  Notify: Charge Nurse/RN  Name of Charge Nurse/RN Notified Arland  Assess: SIRS CRITERIA  SIRS Temperature  0  SIRS Respirations  0  SIRS Pulse 1  SIRS WBC 0  SIRS Score Sum  1

## 2023-02-21 NOTE — Anesthesia Postprocedure Evaluation (Signed)
 Anesthesia Post Note  Patient: Brittney Ray  Procedure(s) Performed: ESOPHAGOGASTRODUODENOSCOPY (EGD) WITH PROPOFOL  BIOPSY  Patient location during evaluation: Endoscopy Anesthesia Type: General Level of consciousness: awake and alert Pain management: pain level controlled Vital Signs Assessment: post-procedure vital signs reviewed and stable Respiratory status: spontaneous breathing, nonlabored ventilation, respiratory function stable and patient connected to nasal cannula oxygen Cardiovascular status: blood pressure returned to baseline and stable Postop Assessment: no apparent nausea or vomiting Anesthetic complications: no  No notable events documented.   Last Vitals:  Vitals:   02/21/23 1056 02/21/23 1137  BP: (!) 124/54 (!) 141/75  Pulse: 83 90  Resp: 18 18  Temp:  36.6 C  SpO2: 98% 99%    Last Pain:  Vitals:   02/21/23 1056  TempSrc:   PainSc: 0-No pain                 Debby Mines

## 2023-02-21 NOTE — Transfer of Care (Signed)
 Immediate Anesthesia Transfer of Care Note  Patient: Brittney Ray  Procedure(s) Performed: Procedure(s): ESOPHAGOGASTRODUODENOSCOPY (EGD) WITH PROPOFOL  (N/A) BIOPSY  Patient Location: PACU and Endoscopy Unit  Anesthesia Type:General  Level of Consciousness: sedated  Airway & Oxygen Therapy: Patient Spontanous Breathing and Patient connected to nasal cannula oxygen  Post-op Assessment: Report given to RN and Post -op Vital signs reviewed and stable  Post vital signs: Reviewed and stable  Last Vitals:  Vitals:   02/21/23 1000 02/21/23 1036  BP: 118/61 129/61  Pulse: 81 77  Resp: 18 16  Temp: 37.2 C (!) 36.1 C  SpO2: 100% 97%    Complications: No apparent anesthesia complications

## 2023-02-21 NOTE — Care Plan (Addendum)
 Brief GI Post op note  Please see op report for further information. Continue bid ppi- will need po ppi for 8 weeks Ok for full liquid diet Should patient continue to show signs of bleeding or lack of response to prbcs, consider tagged rbc scan or cta to evaluate for other sources of bleeding aside from duodenal ulcer and small gastric ulcer.  Given location of duodenal ulcer, this would be difficult to definitively treat endoscopically if rebleeding occurs. All measures would be temporizing (ie- hemaspray). Would need embolization or surgical intervention if rebleeding occurs for definitive treatment.  I discussed this with her husband over the phone. He verbalized understanding and appreciated the phone call. I also discussed this with the hospitalist.  Brittney EMERSON Jungling, DO [facility] Gastroenterology

## 2023-02-21 NOTE — Progress Notes (Addendum)
 [facility]  PROGRESS NOTE  Brittney Ray FMW:[id number] DOB: 03-May-1950 DOA: 02/19/2023 PCP: Alla Amis, MD   Brief HPI:   73 y.o. female with medical history significant of progressive supranuclear palsy, hypertension, hyperlipidemia, diabetes mellitus, CAD, TIA, hypothyroidism, depression with anxiety, CKD-3A, Paget bone disease, breast cancer (s/p of right lumpectomy, radiation therapy), who presents with weakness, dark stool and fall.   pt was found to have hemoglobin dropped from 10.1 on 11/05/2022--> 6.1 today, WBC 10.7, worsening renal function, negative PCR for COVID, flu and RSV, blood pressure 80/50, which improved to 92/67 after giving 1 L LR bolus, heart rate of 128 -> 87, RR 16, oxygen saturation 92% on 2 L oxygen.  Chest x-ray negative.  CT of head is negative for acute intracranial abnormalities.  Patient is placed on telemetry bed observation. Dr. Jinny of GI is consulted.    Assessment/Plan:   GI bleeding and acute blood loss anemia:  -Hb  10.1 --> 6.1. Consulted Dr. Jinny of GI.   -Hold aspirin  - status post 3 units of PRBC  -Getting 2 unit PRBC this morning -Avoid NSAID and heparin -GI consulted, underwent EGD today -Showed duodenal ulcer on the posterior wall, difficult to assess also has some gastric ulcers -If continues to have drop in hemoglobin consider CTA or RBC tagged scan    Fall at home, initial encounter:  CT-head negative -fall precaution -PT/OT   Acquired hypothyroidism -Synthroid    Acute renal failure superimposed on stage 3a chronic kidney disease (HCC):  Recent baseline creatinine 1.32 on 11/05/2022.  Her creatinine is 1.90,  -Improved to 1.36; at baseline   Essential hypertension -Amlodipine , Coreg  and Zestoretic  were held due to hypotension -Will restart Coreg  due to mild rebound tachycardia Continue to monitor patient's blood pressure closely   Type II diabetes mellitus with renal manifestations (HCC):  Recent A1c 5.0 on  08/24/2021, well-controlled.  Patient taking metformin at home -Sliding scale insulin  with NovoLog    Mixed hyperlipidemia -Lipitor -Tricor    TIA (transient ischemic attack) -Hold aspirin  -Lipitor   Coronary artery disease: No chest pain -Hold aspirin  -Lipitor   Depression with anxiety -Continue home medications      Medications     [MAR Hold] atorvastatin   40 mg Oral QHS   [MAR Hold] busPIRone   5 mg Oral BID   carvedilol   3.125 mg Oral BID WC   [MAR Hold] escitalopram   10 mg Oral Daily   [MAR Hold] fenofibrate   160 mg Oral Daily   [MAR Hold] insulin  aspart  0-5 Units Subcutaneous QHS   [MAR Hold] insulin  aspart  0-9 Units Subcutaneous TID WC   [MAR Hold] levothyroxine   25 mcg Oral Q0600   [MAR Hold] pantoprazole  (PROTONIX ) IV  40 mg Intravenous Q12H     Data Reviewed:   CBG:  Recent Labs  Lab 02/20/23 1526 02/20/23 1733 02/20/23 1943 02/21/23 0231 02/21/23 0748  GLUCAP 157* 87 88 95 83    SpO2: 100 % O2 Flow Rate (L/min): 2 L/min    Vitals:   02/21/23 0642 02/21/23 0718 02/21/23 0745 02/21/23 1000  BP: (!) 119/54 (!) 133/55 (!) 121/54 118/61  Pulse: 81 93 98 81  Resp:   14 18  Temp: 97.9 F (36.6 C) 97.8 F (36.6 C) 98.1 F (36.7 C) 98.9 F (37.2 C)  TempSrc: Oral Oral  Oral  SpO2: 100% 100% 100% 100%  Weight:    66.7 kg  Height:    5' 1 (1.549 m)      Data  Reviewed:  Basic Metabolic Panel: Recent Labs  Lab 02/19/23 1501 02/20/23 0413 02/21/23 0423  NA 139 139 140  K 4.2 4.0 4.2  CL 109 112* 113*  CO2 21* 21* 19*  GLUCOSE 156* 128* 89  BUN 99* 100* 85*  CREATININE 1.90* 1.48* 1.36*  CALCIUM  9.3 8.4* 8.0*    CBC: Recent Labs  Lab 02/19/23 1501 02/19/23 2052 02/20/23 0413 02/20/23 1421 02/20/23 1919 02/21/23 0423  WBC 10.7* 9.6 7.4  --   --  7.6  HGB 6.1* 5.5* 6.1* 6.8* 6.6* 6.3*  HCT 18.9* 17.3* 17.9* 20.0* 19.9* 18.9*  MCV 90.0 91.5 89.1  --   --  90.9  PLT 216 196 136*  --   --  160    LFT No results for  input(s): AST, ALT, ALKPHOS, BILITOT, PROT, ALBUMIN in the last 168 hours.   Antibiotics: Anti-infectives (From admission, onward)    None        DVT prophylaxis: SCDs  Code Status: DNR  Family Communication: No family at bedside   CONSULTS gastroenterology   Subjective   Complains of abdominal pain.   Objective    Physical Examination:  Appears in mild distress S1-S2, regular Abdomen-mild tenderness in epigastric region  Status is: Inpatient:             Brittney Ray S Ary Lavine   [facility] If 7PM-7AM, please contact night-coverage at [email or web address], Office  [phone]   02/21/2023, 10:25 AM  LOS: 0 days

## 2023-02-21 NOTE — Progress Notes (Signed)
 PT Cancellation Note  Patient Details Name: Brittney Ray MRN: [id number] DOB: 08-Aug-1950   Cancelled Treatment:    Reason Eval/Treat Not Completed: Medical issues which prohibited therapy: Pt's most recent Hgb 6.3 falling below guidelines for participation with PT services. Will attempt to see pt at a future date/time as medically appropriate.     CHARM Glendia Bertin PT, DPT 02/21/23, 8:11 AM

## 2023-02-21 NOTE — Progress Notes (Addendum)
 Error in charting.

## 2023-02-22 ENCOUNTER — Encounter: Payer: Self-pay | Admitting: Gastroenterology

## 2023-02-22 DIAGNOSIS — F418 Other specified anxiety disorders: Secondary | ICD-10-CM | POA: Diagnosis not present

## 2023-02-22 DIAGNOSIS — K922 Gastrointestinal hemorrhage, unspecified: Secondary | ICD-10-CM

## 2023-02-22 DIAGNOSIS — K269 Duodenal ulcer, unspecified as acute or chronic, without hemorrhage or perforation: Secondary | ICD-10-CM | POA: Diagnosis not present

## 2023-02-22 DIAGNOSIS — E039 Hypothyroidism, unspecified: Secondary | ICD-10-CM | POA: Diagnosis not present

## 2023-02-22 DIAGNOSIS — D62 Acute posthemorrhagic anemia: Secondary | ICD-10-CM | POA: Diagnosis not present

## 2023-02-22 DIAGNOSIS — K279 Peptic ulcer, site unspecified, unspecified as acute or chronic, without hemorrhage or perforation: Secondary | ICD-10-CM | POA: Diagnosis not present

## 2023-02-22 LAB — BPAM RBC
Blood Product Expiration Date: 202502242359
Blood Product Expiration Date: 202502242359
Blood Product Expiration Date: 202502242359
Blood Product Expiration Date: 202503132359
ISSUE DATE / TIME: 202502080741
ISSUE DATE / TIME: 202502090657
ISSUE DATE / TIME: 202502090947
ISSUE DATE / TIME: 202502072220
Unit Type and Rh: 202502242359
Unit Type and Rh: 600
Unit Type and Rh: 600
Unit Type and Rh: 600
Unit Type and Rh: 6200

## 2023-02-22 LAB — BASIC METABOLIC PANEL
Anion gap: 6 (ref 5–15)
BUN: 43 mg/dL — ABNORMAL HIGH (ref 8–23)
CO2: 23 mmol/L (ref 22–32)
Calcium: 8.6 mg/dL — ABNORMAL LOW (ref 8.9–10.3)
Chloride: 110 mmol/L (ref 98–111)
Creatinine, Ser: 1.07 mg/dL — ABNORMAL HIGH (ref 0.44–1.00)
GFR, Estimated: 55 mL/min — ABNORMAL LOW (ref >60)
Glucose, Bld: 103 mg/dL — ABNORMAL HIGH (ref 70–99)
Potassium: 4.3 mmol/L (ref 3.5–5.1)
Sodium: 139 mmol/L (ref 135–145)

## 2023-02-22 LAB — TYPE AND SCREEN
ABO/RH(D): A NEG
Antibody Screen: NEGATIVE
Unit division: 0
Unit division: 0
Unit division: 0
Unit division: 0

## 2023-02-22 LAB — CBC
HCT: 25.9 % — ABNORMAL LOW (ref 36.0–46.0)
Hemoglobin: 9.2 g/dL — ABNORMAL LOW (ref 12.0–15.0)
MCH: 31.1 pg (ref 26.0–34.0)
MCHC: 35.5 g/dL (ref 30.0–36.0)
MCV: 87.5 fL (ref 80.0–100.0)
Platelets: 148 10*3/uL — ABNORMAL LOW (ref 150–400)
RBC: 2.96 MIL/uL — ABNORMAL LOW (ref 3.87–5.11)
RDW: 15.4 % (ref 11.5–15.5)
WBC: 6.8 10*3/uL (ref 4.0–10.5)
nRBC: 0 % (ref 0.0–0.2)

## 2023-02-22 LAB — GLUCOSE, CAPILLARY
Glucose-Capillary: 95 mg/dL (ref 70–99)
Glucose-Capillary: 153 mg/dL — ABNORMAL HIGH (ref 70–99)

## 2023-02-22 MED ORDER — ENSURE ENLIVE PO LIQD: 237.0000 mL | Freq: Two times a day (BID) | ORAL | Status: DC

## 2023-02-22 MED ORDER — HYDROCHLOROTHIAZIDE 12.5 MG PO TABS
12.5000 mg | ORAL_TABLET | Freq: Every day | ORAL | Status: DC
  Administered 2023-02-22: 12.5 mg via ORAL
  Filled 2023-02-22: qty 1

## 2023-02-22 MED ORDER — ENSURE ENLIVE PO LIQD: 237.0000 mL | Freq: Two times a day (BID) | ORAL | 12 refills | Status: DC

## 2023-02-22 MED ORDER — PANTOPRAZOLE SODIUM 40 MG PO TBEC: 40.0000 mg | DELAYED_RELEASE_TABLET | Freq: Two times a day (BID) | ORAL | Status: DC

## 2023-02-22 MED ORDER — LISINOPRIL-HYDROCHLOROTHIAZIDE 20-12.5 MG PO TABS: 1.0000 | ORAL_TABLET | Freq: Every day | ORAL | Status: DC

## 2023-02-22 MED ORDER — PANTOPRAZOLE SODIUM 40 MG PO TBEC
40.0000 mg | DELAYED_RELEASE_TABLET | Freq: Two times a day (BID) | ORAL | 1 refills | Status: DC
Start: 2023-02-22 — End: 2023-04-12

## 2023-02-22 MED ORDER — AMLODIPINE BESYLATE 5 MG PO TABS
5.0000 mg | ORAL_TABLET | Freq: Every day | ORAL | Status: DC
  Administered 2023-02-22: 5 mg via ORAL
  Filled 2023-02-22: qty 1

## 2023-02-22 MED ORDER — LISINOPRIL 20 MG PO TABS
20.0000 mg | ORAL_TABLET | Freq: Every day | ORAL | Status: DC
  Administered 2023-02-22: 20 mg via ORAL
  Filled 2023-02-22: qty 1

## 2023-02-22 NOTE — Plan of Care (Signed)
 Patient is adequate for discharge to previous environment with spouse.  Discharge recommendations reviewed with patient and spouse.  Patient transported via private vehicle.

## 2023-02-22 NOTE — Plan of Care (Signed)
  Problem: Nutritional: Goal: Maintenance of adequate nutrition will improve Outcome: Progressing   Problem: Skin Integrity: Goal: Risk for impaired skin integrity will decrease Outcome: Progressing   Problem: Education: Goal: Knowledge of General Education information will improve Description: Including pain rating scale, medication(s)/side effects and non-pharmacologic comfort measures Outcome: Progressing   Problem: Nutrition: Goal: Adequate nutrition will be maintained Outcome: Progressing   Problem: Coping: Goal: Level of anxiety will decrease Outcome: Progressing   Problem: Elimination: Goal: Will not experience complications related to bowel motility Outcome: Progressing Goal: Will not experience complications related to urinary retention Outcome: Progressing   Problem: Pain Managment: Goal: General experience of comfort will improve and/or be controlled Outcome: Progressing   Problem: Safety: Goal: Ability to remain free from injury will improve Outcome: Progressing

## 2023-02-22 NOTE — Evaluation (Signed)
 Physical Therapy Evaluation Patient Details Name: Brittney Ray MRN: [id number] DOB: [birth year] Today's Date: 02/22/2023  History of Present Illness  Pt is a 73 y.o. female with medical history significant of progressive supranuclear palsy, hypertension, hyperlipidemia, diabetes mellitus, CAD, TIA, hypothyroidism, depression with anxiety, CKD-3A, Paget bone disease, breast cancer (s/p of right lumpectomy, radiation therapy), who presents with weakness, dark stool and fall.  MD assessment includes: GI bleeding and acute blood loss anemia s/p multiple transfusions, fall, and acute renal failure on CKD.   Clinical Impression  Pt was pleasant and motivated to participate during the session and put forth good effort throughout. Pt required min extra time and effort and use of bed rail during sup to sit but no physical assist. Pt demonstrated fair to good control and stability during sit to/from stands from various height surfaces with no need for UE assist.  Pt required mod lean on the RW for support and close CGA along with cuing while taking steps at the EOB and from bed to chair but required no physical assist.  Pt will benefit from continued PT services upon discharge to safely address deficits listed in patient problem list for decreased caregiver assistance and eventual return to PLOF.          If plan is discharge home, recommend the following: A little help with walking and/or transfers;A little help with bathing/dressing/bathroom;Assistance with cooking/housework;Direct supervision/assist for medications management;Help with stairs or ramp for entrance;Assist for transportation   Can travel by private vehicle        Equipment Recommendations None recommended by PT  Recommendations for Other Services       Functional Status Assessment Patient has had a recent decline in their functional status and demonstrates the ability to make significant improvements in function in a reasonable  and predictable amount of time.     Precautions / Restrictions Precautions Precautions: Fall Restrictions Weight Bearing Restrictions Per Provider Order: No      Mobility  Bed Mobility Overal bed mobility: Needs Assistance Bed Mobility: Supine to Sit     Supine to sit: Supervision     General bed mobility comments: Min extra time and effort as well as use of the bed rail    Transfers Overall transfer level: Needs assistance Equipment used: Rolling walker (2 wheels) Transfers: Sit to/from Stand Sit to Stand: Contact guard assist           General transfer comment: Fair to good control and stability with transfers to/from various height surfaces    Ambulation/Gait Ambulation/Gait assistance: Contact guard assist Gait Distance (Feet): 5 Feet Assistive device: Rolling walker (2 wheels) Gait Pattern/deviations: Step-through pattern, Decreased step length - right, Decreased step length - left, Trunk flexed Gait velocity: decreased     General Gait Details: Pt able to take several steps near the EOB and from bed to chair with close CGA and cues for staying closer to the RW while taking steps adn within the RW during sharp turns  Stairs            Wheelchair Mobility     Tilt Bed    Modified Rankin (Stroke Patients Only)       Balance Overall balance assessment: Needs assistance Sitting-balance support: Feet supported, Single extremity supported Sitting balance-Leahy Scale: Good     Standing balance support: Bilateral upper extremity supported, During functional activity, Reliant on assistive device for balance Standing balance-Leahy Scale: Fair  Pertinent Vitals/Pain Pain Assessment Pain Assessment: 0-10 Pain Location: back pain Pain Descriptors / Indicators: Sore Pain Intervention(s): Monitored during session, Repositioned, Patient requesting pain meds-RN notified    Home Living Family/patient expects  to be discharged to:: Private residence Living Arrangements: Spouse/significant other Available Help at Discharge: Family;Available 24 hours/day;Personal care attendant Type of Home: House Home Access: Stairs to enter Entrance Stairs-Rails: None Entrance Stairs-Number of Steps: 2-3   Home Layout: One level Home Equipment: Agricultural consultant (2 wheels);Shower seat;Hospital bed Additional Comments: Sister, spouse, and PCA provide supervision for patient    Prior Function Prior Level of Function : History of Falls (last six months)             Mobility Comments: HH ambulation with a RW with close SBA, w/c for community access, 6 falls in the last 6 months per patient secondary to LOB ADLs Comments: Assist for all ADLs     Extremity/Trunk Assessment   Upper Extremity Assessment Upper Extremity Assessment: Generalized weakness    Lower Extremity Assessment Lower Extremity Assessment: Generalized weakness       Communication   Communication Communication: No apparent difficulties Cueing Techniques: Verbal cues;Tactile cues;Visual cues  Cognition Arousal: Alert Behavior During Therapy: WFL for tasks assessed/performed Overall Cognitive Status: Within Functional Limits for tasks assessed                                          General Comments      Exercises     Assessment/Plan    PT Assessment Patient needs continued PT services  PT Problem List Decreased strength;Decreased activity tolerance;Decreased balance;Decreased mobility;Decreased knowledge of use of DME       PT Treatment Interventions DME instruction;Gait training;Stair training;Functional mobility training;Therapeutic activities;Therapeutic exercise;Balance training;Patient/family education    PT Goals (Current goals can be found in the Care Plan section)  Acute Rehab PT Goals Patient Stated Goal: To get stronger PT Goal Formulation: With patient Time For Goal Achievement:  03/07/23 Potential to Achieve Goals: Fair    Frequency Min 1X/week     Co-evaluation               AM-PAC PT "6 Clicks" Mobility  Outcome Measure Help needed turning from your back to your side while in a flat bed without using bedrails?: None Help needed moving from lying on your back to sitting on the side of a flat bed without using bedrails?: None Help needed moving to and from a bed to a chair (including a wheelchair)?: A Little Help needed standing up from a chair using your arms (e.g., wheelchair or bedside chair)?: A Little Help needed to walk in hospital room?: A Lot Help needed climbing 3-5 steps with a railing? : Total 6 Click Score: 17    End of Session Equipment Utilized During Treatment: Gait belt Activity Tolerance: Patient tolerated treatment well Patient left: in chair;with call bell/phone within reach;with chair alarm set Nurse Communication: Mobility status;Patient requests pain meds PT Visit Diagnosis: Unsteadiness on feet (R26.81);History of falling (Z91.81);Difficulty in walking, not elsewhere classified (R26.2);Muscle weakness (generalized) (M62.81)    Time: 4782-9562 PT Time Calculation (min) (ACUTE ONLY): 30 min   Charges:   PT Evaluation $PT Eval Moderate Complexity: 1 Mod   PT General Charges $$ ACUTE PT VISIT: 1 Visit       Brittney Ray PT, DPT 02/22/23, 10:29 AM

## 2023-02-22 NOTE — Discharge Instructions (Signed)
 Patient will hold off aspirin  for one week.

## 2023-02-22 NOTE — Progress Notes (Signed)
 Brittney Sink, MD [facility]   [address] [place], [place] [address] Phone: [phone] Fax : [phone]   Subjective: The patient is sitting up in a chair and reports that her legs are hurting today but she denies any abdominal pain.  There is no report of any further sign of GI bleeding.  The patient had a upper endoscopy with duodenal ulcer found.  Her hemoglobin has been stable overnight and she is tolerating a clear liquid diet.   Objective: Vital signs in last 24 hours: Vitals:   02/21/23 2009 02/21/23 2357 02/22/23 0519 02/22/23 0723  BP: (!) 126/52 (!) 111/47 (!) 143/61 (!) 148/52  Pulse: 78 64 64 67  Resp: 16 16 16 18   Temp: 98.1 F (36.7 C) 97.9 F (36.6 C) 98.1 F (36.7 C) 97.9 F (36.6 C)  TempSrc:      SpO2: 100% 97% 99% 100%  Weight:      Height:       Weight change:   Intake/Output Summary (Last 24 hours) at 02/22/2023 1100 Last data filed at 02/21/2023 2201 Gross per 24 hour  Intake 180 ml  Output 900 ml  Net -720 ml     Exam: Heart:: Regular rate and rhythm or without murmur or extra heart sounds Lungs: normal and clear to auscultation and percussion Abdomen: soft, nontender, normal bowel sounds   Lab Results: @LABTEST2 @ Micro Results: Recent Results (from the past 240 hours)  Resp panel by RT-PCR (RSV, Flu A&B, Covid) Anterior Nasal Swab     Status: None   Collection Time: 02/19/23  3:01 PM   Specimen: Anterior Nasal Swab  Result Value Ref Range Status   SARS Coronavirus 2 by RT PCR NEGATIVE NEGATIVE Final    Comment: (NOTE) SARS-CoV-2 target nucleic acids are NOT DETECTED.  The SARS-CoV-2 RNA is generally detectable in upper respiratory specimens during the acute phase of infection. The lowest concentration of SARS-CoV-2 viral copies this assay can detect is 138 copies/mL. A negative result does not preclude SARS-Cov-2 infection and should not be used as the sole basis for treatment or other patient management decisions. A  negative result may occur with  improper specimen collection/handling, submission of specimen other than nasopharyngeal swab, presence of viral mutation(s) within the areas targeted by this assay, and inadequate number of viral copies(<138 copies/mL). A negative result must be combined with clinical observations, patient history, and epidemiological information. The expected result is Negative.  Fact Sheet for Patients:  BloggerCourse.com  Fact Sheet for Healthcare Providers:  SeriousBroker.it  This test is no t yet approved or cleared by the [place]  FDA and  has been authorized for detection and/or diagnosis of SARS-CoV-2 by FDA under an Emergency Use Authorization (EUA). This EUA will remain  in effect (meaning this test can be used) for the duration of the COVID-19 declaration under Section 564(b)(1) of the Act, 21 U.S.C.section 360bbb-3(b)(1), unless the authorization is terminated  or revoked sooner.       Influenza A by PCR NEGATIVE NEGATIVE Final   Influenza B by PCR NEGATIVE NEGATIVE Final    Comment: (NOTE) The Xpert Xpress SARS-CoV-2/FLU/RSV plus assay is intended as an aid in the diagnosis of influenza from Nasopharyngeal swab specimens and should not be used as a sole basis for treatment. Nasal washings and aspirates are unacceptable for Xpert Xpress SARS-CoV-2/FLU/RSV testing.  Fact Sheet for Patients: BloggerCourse.com  Fact Sheet for Healthcare Providers: SeriousBroker.it  This test is not yet approved or cleared by the  [place]  FDA and has been authorized for detection and/or diagnosis of SARS-CoV-2 by FDA under an Emergency Use Authorization (EUA). This EUA will remain in effect (meaning this test can be used) for the duration of the COVID-19 declaration under Section 564(b)(1) of the Act, 21 U.S.C. section 360bbb-3(b)(1), unless the authorization  is terminated or revoked.     Resp Syncytial Virus by PCR NEGATIVE NEGATIVE Final    Comment: (NOTE) Fact Sheet for Patients: BloggerCourse.com  Fact Sheet for Healthcare Providers: SeriousBroker.it  This test is not yet approved or cleared by the [place]  FDA and has been authorized for detection and/or diagnosis of SARS-CoV-2 by FDA under an Emergency Use Authorization (EUA). This EUA will remain in effect (meaning this test can be used) for the duration of the COVID-19 declaration under Section 564(b)(1) of the Act, 21 U.S.C. section 360bbb-3(b)(1), unless the authorization is terminated or revoked.  Performed at [facility], [address] [place], [place] [address]    Studies/Results: No results found. Medications: I have reviewed the patient's current medications. Scheduled Meds:  atorvastatin   40 mg Oral QHS   busPIRone   5 mg Oral BID   carvedilol   3.125 mg Oral BID WC   escitalopram   10 mg Oral Daily   feeding supplement  237 mL Oral BID BM   fenofibrate   160 mg Oral Daily   insulin  aspart  0-5 Units Subcutaneous QHS   insulin  aspart  0-9 Units Subcutaneous TID WC   levothyroxine   25 mcg Oral Q0600   pantoprazole   40 mg Oral BID AC   Continuous Infusions: PRN Meds:.acetaminophen , hydrALAZINE , menthol -cetylpyridinium, ondansetron  (ZOFRAN ) IV, oxyCODONE    Assessment: Principal Problem:   GI bleeding Active Problems:   Acquired hypothyroidism   Coronary artery disease   Essential hypertension   Mixed hyperlipidemia   TIA (transient ischemic attack)   Depression with anxiety   Type II diabetes mellitus with renal manifestations (HCC)   Fall at home, initial encounter   Acute blood loss anemia   Acute renal failure superimposed on stage 3a chronic kidney disease (HCC)   GI bleed    Plan: This patient had a upper endoscopy with duodenal ulcer presumably because of the patient's drop in  hemoglobin.  Her hemoglobin is now stable.  She denies any abdominal pain and is only reporting some leg pain.  The patient is tolerating a clear liquid diet.  Have spoken to the hospitalist and she has advance the patient's diet.  Nothing to do from a GI point of view.   I will sign off.  Please call if any further GI concerns or questions.  We would like to thank you for the opportunity to participate in the care of Brittney Ray.    LOS: 1 day   Brittney Sink, MD.FACG 02/22/2023, 11:00 AM Pager [phone] 7am-5pm  Check AMION for 5pm -7am coverage and on weekends

## 2023-02-22 NOTE — Discharge Summary (Signed)
 Physician Discharge Summary   Patient: Brittney Ray MRN: [id number] DOB: 1950-07-22  Admit date:     02/19/2023  Discharge date: 02/22/23  Discharge Physician: Melvinia Stager   PCP: Monique Ano, MD   Recommendations at discharge:   follow-up PCP in 1 to 2 weeks  Discharge Diagnoses: Principal Problem:   GI bleeding Active Problems:   Acute blood loss anemia   Fall at home, initial encounter   Acquired hypothyroidism   Acute renal failure superimposed on stage 3a chronic kidney disease (HCC)   Essential hypertension   Type II diabetes mellitus with renal manifestations (HCC)   Mixed hyperlipidemia   TIA (transient ischemic attack)   Coronary artery disease   Depression with anxiety   GI bleed   Upper GI bleed  73 y.o. female with medical history significant of progressive supranuclear palsy, hypertension, hyperlipidemia, diabetes mellitus, CAD, TIA, hypothyroidism, depression with anxiety, CKD-3A, Paget bone disease, breast cancer (s/p of right lumpectomy, radiation therapy), who presents with weakness, dark stool and fall.   Chest x-ray negative. CT of head is negative for acute intracranial abnormalities.   GI bleeding and acute blood loss anemia:  peptic ulcer disease (EGD showed duodenal and small gastric ulcer_ -Hb  10.1 --> 6.1.-- Three unit blood transfusion -- 9.4, 9.2  --consulted Dr. Ole Berkeley of GI.   -Hold aspirin  for about 1 week and then resume enteric coated ASA - -Avoid NSAID and heparin -GI consulted, underwent EGD-Showed duodenal ulcer on the posterior wall, difficult to assess also has some gastric ulcers -patient's hemoglobin remains stable. Diet advance to soft. No more G.I. bleeding reported by staff. Okay from G.I. standpoint for discharge. Patient otherwise hemodynamically stable.   Fall at home, initial encounter:  CT-head negative -fall precaution -PT/OT-- recommends home health. Discussed with husband who is not much keen on getting home  health. TOC to discuss long-term placement plan with husband per his request   Acquired hypothyroidism -Synthroid    Acute renal failure superimposed on stage 3a chronic kidney disease (HCC):  Recent baseline creatinine 1.32 on 11/05/2022.  Her creatinine is 1.90,  -Improved to 1.36; at baseline   Essential hypertension -zoomed home meds since blood pressure on the higher side. Creatinine stable.  Type II diabetes mellitus with renal manifestations (HCC):  Recent A1c 5.0 on 08/24/2021, well-controlled.  Patient taking metformin at home resume since patient is having intermittent spikes of sugar into 140s to 178. Defer to PCP regarding management. -Sliding scale insulin  with NovoLog    Mixed hyperlipidemia -cont home meds   TIA (transient ischemic attack) -Hold aspirin -- resume after one week. Enteric-coated recommended -Lipitor   Coronary artery disease: No chest pain -Hold aspirin    Depression with anxiety -Continue home medications  Patient worked with physical therapy recommends home health PT OT. TOC to discuss with patient's husband regarding long-term placement. Husband expresses not able to take care of patient at home and wants to discuss placement with TOC husband also recommended to discuss with PCP as outpatient. For now patient will discharge to home with home health. Husband is in agreement      Consultants: G.I. Procedures performed: EGD Disposition: Home health Diet recommendation:  Discharge Diet Orders (From admission, onward)     Start     Ordered   02/22/23 0000  Diet - low sodium heart healthy        02/22/23 1147           Cardiac and Carb modified diet DISCHARGE MEDICATION: Allergies as of  02/22/2023       Reactions   Contrast Media [iodinated Contrast Media] Hives, Cough   Codeine Anxiety   Morphine  And Codeine Other (See Comments)   Tremors   Sulfa Antibiotics Nausea And Vomiting        Medication List     PAUSE taking these  medications    aspirin  325 MG tablet Wait to take this until: March 02, 2023 Take 325 mg by mouth daily.       TAKE these medications    ACCU-CHEK AVIVA PLUS VI 1 each as directed   Accu-Chek Aviva Plus test strip Generic drug: glucose blood USE 1 STRIP TO CHECK GLUCOSE THREE TIMES DAILY   alendronate 70 MG tablet Commonly known as: FOSAMAX Take 70 mg by mouth once a week. Take with a full glass of water on an empty stomach.   amLODipine  5 MG tablet Commonly known as: NORVASC  Take 5 mg by mouth daily.   atorvastatin  40 MG tablet Commonly known as: LIPITOR Take 40 mg by mouth at bedtime.   busPIRone  5 MG tablet Commonly known as: BUSPAR  Take 5 mg by mouth 2 (two) times daily.   Calcium  Carb-Cholecalciferol  600-800 MG-UNIT Tabs Take 1 tablet by mouth daily with breakfast.   carvedilol  3.125 MG tablet Commonly known as: COREG  Take 3.125 mg by mouth 2 (two) times daily with a meal.   escitalopram  10 MG tablet Commonly known as: LEXAPRO  Take 1 tablet (10 mg total) by mouth daily.   feeding supplement Liqd Take 237 mLs by mouth 2 (two) times daily between meals.   fenofibrate  160 MG tablet Take 1 tablet (160 mg total) by mouth daily.   levothyroxine  25 MCG tablet Commonly known as: SYNTHROID  Take 25 mcg by mouth daily before breakfast.   lisinopril -hydrochlorothiazide  20-12.5 MG tablet Commonly known as: ZESTORETIC  Take 1 tablet by mouth daily.   metFORMIN 500 MG tablet Commonly known as: GLUCOPHAGE Take 500 mg by mouth at bedtime.   pantoprazole  40 MG tablet Commonly known as: PROTONIX  Take 1 tablet (40 mg total) by mouth 2 (two) times daily before a meal. What changed: when to take this               Discharge Care Instructions  (From admission, onward)           Start     Ordered   02/22/23 0000  Discharge wound care:       Comments: Pressure Injury 08/24/21 Sacrum Stage 1 -  Intact skin with non-blanchable redness of a localized  area usually over a bony prominence. red area, blanchable 546 days      Wound Care Orders (From admission, onward)      Start     Ordered   02/20/23 1234    Wound care  Every shift      Comments: RLQ MASD  02/20/23 1233   02/22/23 1147            Follow-up Information     Monique Ano, MD Follow up.   Specialty: Family Medicine Why: Hospital follow up Contact information: [address] [place] [address] [phone]         Quintin Buckle, DO. Schedule an appointment as soon as possible for a visit in 3 week(s).   Specialty: Gastroenterology Why: F/u PUD Contact information: [address] [facility] [place] [address] [phone]                Discharge Exam: Cleavon Curls Weights  02/19/23 1846 02/21/23 1000  Weight: 66.7 kg 66.7 kg   Alert and awake respiratory clear to auscultation cardiovascular both heart sounds normal abdomen soft benign  Condition at discharge: fair  The results of significant diagnostics from this hospitalization (including imaging, microbiology, ancillary and laboratory) are listed below for reference.   Imaging Studies: CT HEAD WO CONTRAST ( ) Result Date: 02/19/2023 CLINICAL DATA:  nonfocal altered EXAM: CT HEAD WITHOUT CONTRAST TECHNIQUE: Contiguous axial images were obtained from the base of the skull through the vertex without intravenous contrast. RADIATION DOSE REDUCTION: This exam was performed according to the departmental dose-optimization program which includes automated exposure control, adjustment of the mA and/or kV according to patient size and/or use of iterative reconstruction technique. COMPARISON:  CT head 11/12/2021 FINDINGS: Brain: Patchy and confluent areas of decreased attenuation are noted throughout the deep and periventricular white matter of the cerebral hemispheres bilaterally, compatible with chronic microvascular ischemic disease. No evidence of large-territorial  acute infarction. No parenchymal hemorrhage. No mass lesion. No extra-axial collection. No mass effect or midline shift. No hydrocephalus. Basilar cisterns are patent. Vascular: No hyperdense vessel. Atherosclerotic calcifications are present within the cavernous internal and vertebral carotid arteries. Skull: No acute fracture or focal lesion. Sinuses/Orbits: Paranasal sinuses and mastoid air cells are clear. The orbits are unremarkable. Other: None. IMPRESSION: No acute intracranial abnormality. Electronically Signed   By: Morgane  Naveau M.D.   On: 02/19/2023 20:03   DG Chest Portable 1 View Result Date: 02/19/2023 CLINICAL DATA:  Multiple falls, weakness EXAM: PORTABLE CHEST 1 VIEW COMPARISON:  08/17/2022 FINDINGS: Single frontal view of the chest demonstrates an unremarkable cardiac silhouette. No acute airspace disease, effusion, or pneumothorax. Prior healed rib fractures are again noted. No acute bony abnormalities. IMPRESSION: 1. No acute intrathoracic process. Electronically Signed   By: Bobbye Burrow M.D.   On: 02/19/2023 19:43   DG Lumbar Spine Complete Result Date: 01/25/2023 CLINICAL DATA:  Fall. EXAM: LUMBAR SPINE - COMPLETE 4+ VIEW COMPARISON:  CT lumbar spine dated November 01, 2022. FINDINGS: 5 nonrib bearing lumbar-type vertebral bodies. Vertebral body heights are maintained. No acute fracture. Similar mild grade 1 retrolisthesis of L2 on L3. Redemonstrated chronic pagetoid changes of the L2 vertebral body. Multilevel degenerative disc changes, most pronounced at L5-S1. SI joints are unremarkable.  Aortic atherosclerosis. IMPRESSION: 1. No acute fracture of the lumbar spine. 2. Multilevel degenerative disc changes, most pronounced at L5-S1. 3. Similar chronic pagetoid changes of the L2 vertebral body. Electronically Signed   By: Mannie Seek M.D.   On: 01/25/2023 19:05    Microbiology: Results for orders placed or performed during the hospital encounter of 02/19/23  Resp panel by  RT-PCR (RSV, Flu A&B, Covid) Anterior Nasal Swab     Status: None   Collection Time: 02/19/23  3:01 PM   Specimen: Anterior Nasal Swab  Result Value Ref Range Status   SARS Coronavirus 2 by RT PCR NEGATIVE NEGATIVE Final    Comment: (NOTE) SARS-CoV-2 target nucleic acids are NOT DETECTED.  The SARS-CoV-2 RNA is generally detectable in upper respiratory specimens during the acute phase of infection. The lowest concentration of SARS-CoV-2 viral copies this assay can detect is 138 copies/mL. A negative result does not preclude SARS-Cov-2 infection and should not be used as the sole basis for treatment or other patient management decisions. A negative result may occur with  improper specimen collection/handling, submission of specimen other than nasopharyngeal swab, presence of viral mutation(s) within the areas targeted by this assay, and  inadequate number of viral copies(<138 copies/mL). A negative result must be combined with clinical observations, patient history, and epidemiological information. The expected result is Negative.  Fact Sheet for Patients:  BloggerCourse.com  Fact Sheet for Healthcare Providers:  SeriousBroker.it  This test is no t yet approved or cleared by the [place]  FDA and  has been authorized for detection and/or diagnosis of SARS-CoV-2 by FDA under an Emergency Use Authorization (EUA). This EUA will remain  in effect (meaning this test can be used) for the duration of the COVID-19 declaration under Section 564(b)(1) of the Act, 21 U.S.C.section 360bbb-3(b)(1), unless the authorization is terminated  or revoked sooner.       Influenza A by PCR NEGATIVE NEGATIVE Final   Influenza B by PCR NEGATIVE NEGATIVE Final    Comment: (NOTE) The Xpert Xpress SARS-CoV-2/FLU/RSV plus assay is intended as an aid in the diagnosis of influenza from Nasopharyngeal swab specimens and should not be used as a sole basis  for treatment. Nasal washings and aspirates are unacceptable for Xpert Xpress SARS-CoV-2/FLU/RSV testing.  Fact Sheet for Patients: BloggerCourse.com  Fact Sheet for Healthcare Providers: SeriousBroker.it  This test is not yet approved or cleared by the [place]  FDA and has been authorized for detection and/or diagnosis of SARS-CoV-2 by FDA under an Emergency Use Authorization (EUA). This EUA will remain in effect (meaning this test can be used) for the duration of the COVID-19 declaration under Section 564(b)(1) of the Act, 21 U.S.C. section 360bbb-3(b)(1), unless the authorization is terminated or revoked.     Resp Syncytial Virus by PCR NEGATIVE NEGATIVE Final    Comment: (NOTE) Fact Sheet for Patients: BloggerCourse.com  Fact Sheet for Healthcare Providers: SeriousBroker.it  This test is not yet approved or cleared by the [place]  FDA and has been authorized for detection and/or diagnosis of SARS-CoV-2 by FDA under an Emergency Use Authorization (EUA). This EUA will remain in effect (meaning this test can be used) for the duration of the COVID-19 declaration under Section 564(b)(1) of the Act, 21 U.S.C. section 360bbb-3(b)(1), unless the authorization is terminated or revoked.  Performed at [facility], [address] [place], [place] [address]     Labs: CBC: Recent Labs  Lab 02/19/23 1501 02/19/23 2052 02/20/23 0413 02/20/23 1421 02/20/23 1919 02/21/23 0423 02/21/23 1500 02/22/23 0535  WBC 10.7* 9.6 7.4  --   --  7.6  --  6.8  HGB 6.1* 5.5* 6.1* 6.8* 6.6* 6.3* 9.4* 9.2*  HCT 18.9* 17.3* 17.9* 20.0* 19.9* 18.9* 26.5* 25.9*  MCV 90.0 91.5 89.1  --   --  90.9  --  87.5  PLT 216 196 136*  --   --  160  --  148*   Basic Metabolic Panel: Recent Labs  Lab 02/19/23 1501 02/20/23 0413 02/21/23 0423 02/22/23 0535  NA 139 139 140 139  K  4.2 4.0 4.2 4.3  CL 109 112* 113* 110  CO2 21* 21* 19* 23  GLUCOSE 156* 128* 89 103*  BUN 99* 100* 85* 43*  CREATININE 1.90* 1.48* 1.36* 1.07*  CALCIUM  9.3 8.4* 8.0* 8.6*   CBG: Recent Labs  Lab 02/21/23 1139 02/21/23 1623 02/21/23 2010 02/22/23 0723 02/22/23 1122  GLUCAP 93 178* 146* 95 153*    Discharge time spent: greater than 30 minutes.  Signed: Melvinia Stager, MD [facility] 02/22/2023

## 2023-02-22 NOTE — Evaluation (Signed)
 Occupational Therapy Evaluation Patient Details Name: Brittney Ray MRN: [id number] DOB: [birth year] Today's Date: 02/22/2023   History of Present Illness Pt is a 73 y.o. female admitted with GI bleeding, fall at home, and acute blood loss anemia (Hgb 6.1). PMH significant for dementia, progressive supranuclear palsy, HTN, HLD, DM, CAD, TIA, hypothyroidism, depression with anxiety, CKD-3A, Paget bone disease, breast cancer (s/p of right lumpectomy, radiation therapy). S/p esophagogastroduodenoscopy performed 2/9.   Clinical Impression   Pt pleasant, alert to self but unaware of date, location or situation. Poor historian with no family to provide PLOF or home setup, info taken from chart review. Pt states she needs help with all ADLs and only walks a "little" with RW (confirmed in chart). Pt appears to be at or close to functional baseline. OT provides setup assist for tray by opening items, pt performing grooming tasks seated in recliner with setup, and demonstrates ability to perform sit<>stand transfers with minA-CGA and RW. Takes a few steps forward with RW and CGA for balance before fatiguing. Pt will require setup for feeding and grooming tasks, up to minA for UB and min-modA for LB ADLs. Pt would benefit from skilled OT services to address noted impairments and functional limitations (see below for any additional details) in order to maximize safety and independence while minimizing falls risk and caregiver burden. Anticipate the need for follow up [facility] OT services upon acute hospital DC.       If plan is discharge home, recommend the following: A little help with walking and/or transfers;A little help with bathing/dressing/bathroom;Direct supervision/assist for medications management;Direct supervision/assist for financial management;Assist for transportation;Help with stairs or ramp for entrance;Supervision due to cognitive status;Assistance with cooking/housework    Functional Status  Assessment  Patient has had a recent decline in their functional status and demonstrates the ability to make significant improvements in function in a reasonable and predictable amount of time.  Equipment Recommendations  BSC/3in1    Recommendations for Other Services       Precautions / Restrictions Precautions Precautions: Fall Restrictions Weight Bearing Restrictions Per Provider Order: No      Mobility Bed Mobility               General bed mobility comments: NT in recliner start and end of session    Transfers Overall transfer level: Needs assistance Equipment used: Rolling walker (2 wheels) Transfers: Sit to/from Stand Sit to Stand: Contact guard assist                  Balance Overall balance assessment: Needs assistance Sitting-balance support: Feet supported, Single extremity supported Sitting balance-Leahy Scale: Good     Standing balance support: Bilateral upper extremity supported, During functional activity, Reliant on assistive device for balance Standing balance-Leahy Scale: Fair Standing balance comment: forward posture, heavy reliance on RW via UEs                           ADL either performed or assessed with clinical judgement   ADL Overall ADL's : Needs assistance/impaired Eating/Feeding: Minimal assistance Eating/Feeding Details (indicate cue type and reason): requires assist to open containers Grooming: Wash/dry hands;Wash/dry face;Sitting;Set up Grooming Details (indicate cue type and reason): setup in recliner                 Toilet Transfer: Contact guard assist;Rolling walker (2 wheels);BSC/3in1;Minimal assistance Toilet Transfer Details (indicate cue type and reason): pt standing from recliner with cues for hand  placement, CGA using RW, no LOB noted Toileting- Clothing Manipulation and Hygiene: Minimal assistance;Sit to/from stand       Functional mobility during ADLs: Contact guard assist;Rolling walker (2  wheels) General ADL Comments: pt likely at or near baseline for ADL performance      Pertinent Vitals/Pain Pain Assessment Pain Assessment: Faces Faces Pain Scale: Hurts little more Pain Location: BLE pain Pain Descriptors / Indicators: Sore Pain Intervention(s): Limited activity within patient's tolerance, Monitored during session, Repositioned     Extremity/Trunk Assessment Upper Extremity Assessment Upper Extremity Assessment: Generalized weakness   Lower Extremity Assessment Lower Extremity Assessment: Generalized weakness       Communication Communication Communication: No apparent difficulties Cueing Techniques: Verbal cues;Visual cues   Cognition Arousal: Alert Behavior During Therapy: WFL for tasks assessed/performed Overall Cognitive Status: No family/caregiver present to determine baseline cognitive functioning                                 General Comments: chart review indicates hx of dementia.                Home Living Family/patient expects to be discharged to:: Private residence Living Arrangements: Spouse/significant other Available Help at Discharge: Family;Available 24 hours/day;Personal care attendant Type of Home: House Home Access: Stairs to enter [employer] of Steps: 2-3 Entrance Stairs-Rails: None Home Layout: One level     Bathroom Shower/Tub: Producer, television/film/video: Standard     Home Equipment: Agricultural consultant (2 wheels);Shower seat;Hospital bed   Additional Comments: Sister, spouse, and PCA provide supervision for patient      Prior Functioning/Environment Prior Level of Function : History of Falls (last six months)             Mobility Comments: HH ambulation with a RW with close SBA, w/c for community access, 6 falls in the last 6 months per patient secondary to LOB ADLs Comments: Assist for all ADLs        OT Problem List: Decreased strength;Decreased range of motion;Decreased  activity tolerance;Impaired balance (sitting and/or standing);Decreased cognition;Pain;Decreased knowledge of use of DME or AE      OT Treatment/Interventions: Self-care/ADL training;Therapeutic exercise;Energy conservation;DME and/or AE instruction;Therapeutic activities;Cognitive remediation/compensation;Patient/family education;Balance training    OT Goals(Current goals can be found in the care plan section) Acute Rehab OT Goals OT Goal Formulation: Patient unable to participate in goal setting Time For Goal Achievement: 03/08/23 Potential to Achieve Goals: Fair  OT Frequency: Min 1X/week       AM-PAC OT "6 Clicks" Daily Activity     Outcome Measure Help from another person eating meals?: None Help from another person taking care of personal grooming?: A Little Help from another person toileting, which includes using toliet, bedpan, or urinal?: A Little Help from another person bathing (including washing, rinsing, drying)?: A Little Help from another person to put on and taking off regular upper body clothing?: A Little Help from another person to put on and taking off regular lower body clothing?: A Little 6 Click Score: 19   End of Session Equipment Utilized During Treatment: Rolling walker (2 wheels) Nurse Communication: Mobility status (pt may not be able to use call bell with cognitive deficts)  Activity Tolerance: Patient tolerated treatment well Patient left: in chair;with call bell/phone within reach;with chair alarm set  OT Visit Diagnosis: Unsteadiness on feet (R26.81);Other abnormalities of gait and mobility (R26.89);Muscle weakness (generalized) (M62.81)  Time: 6213-0865 OT Time Calculation (min): 15 min Charges:  OT General Charges $OT Visit: 1 Visit OT Evaluation $OT Eval Low Complexity: 1 Low  Jceon Alverio L. Ada Holness, OTR/L  02/22/23, 1:16 PM

## 2023-02-22 NOTE — TOC Transition Note (Signed)
 Transition of Care [facility]) - Discharge Note   Patient Details  Name: Brittney Ray MRN: [id number] Date of Birth: 12/28/1950  [facility] CM/SW Contact:  Odilia Bennett, LCSW Phone Number: 02/22/2023, 3:09 PM   Clinical Narrative:   Patient has orders to discharge home today. Patient not fully oriented. Husband at bedside. CSW introduced role and explained that therapy recommendations would be discussed. Husband declined home health because she has had it several times in the past and it has not been helpful. Husband is interested in ALF placement. With his permission, CSW made referral to [facility] to assist with this process. No further concerns. CSW signing off.  Final next level of care: Home/Self Care Barriers to Discharge: No Barriers Identified   Patient Goals and CMS Choice            Discharge Placement                Patient to be transferred to facility by: Husband Name of family member notified: Danford Dupes Patient and family notified of of transfer: 02/22/23  Discharge Plan and Services Additional resources added to the After Visit Summary for                                       Social Drivers of Health (SDOH) Interventions SDOH Screenings   Food Insecurity: No Food Insecurity (02/20/2023)  Housing: Low Risk  (02/20/2023)  Transportation Needs: No Transportation Needs (02/20/2023)  Utilities: Not At Risk (02/20/2023)  Social Connections: Moderately Isolated (02/20/2023)  Tobacco Use: Low Risk  (02/21/2023)     Readmission Risk Interventions     No data to display

## 2023-02-23 LAB — SURGICAL PATHOLOGY

## 2023-04-05 ENCOUNTER — Other Ambulatory Visit: Payer: Self-pay

## 2023-04-05 ENCOUNTER — Observation Stay
Admission: EM | Admit: 2023-04-05 | Discharge: 2023-04-12 | Disposition: A | Discharge status: Hospice | Attending: Internal Medicine | Admitting: Internal Medicine

## 2023-04-05 ENCOUNTER — Encounter: Payer: Self-pay | Admitting: Emergency Medicine

## 2023-04-05 DIAGNOSIS — N179 Acute kidney failure, unspecified: Secondary | ICD-10-CM | POA: Insufficient documentation

## 2023-04-05 DIAGNOSIS — E119 Type 2 diabetes mellitus without complications: Secondary | ICD-10-CM

## 2023-04-05 DIAGNOSIS — Z853 Personal history of malignant neoplasm of breast: Secondary | ICD-10-CM | POA: Diagnosis not present

## 2023-04-05 DIAGNOSIS — A4150 Gram-negative sepsis, unspecified: Secondary | ICD-10-CM | POA: Insufficient documentation

## 2023-04-05 DIAGNOSIS — F419 Anxiety disorder, unspecified: Secondary | ICD-10-CM | POA: Diagnosis present

## 2023-04-05 DIAGNOSIS — Z7982 Long term (current) use of aspirin: Secondary | ICD-10-CM | POA: Diagnosis not present

## 2023-04-05 DIAGNOSIS — N1832 Chronic kidney disease, stage 3b: Secondary | ICD-10-CM | POA: Insufficient documentation

## 2023-04-05 DIAGNOSIS — Z7984 Long term (current) use of oral hypoglycemic drugs: Secondary | ICD-10-CM | POA: Insufficient documentation

## 2023-04-05 DIAGNOSIS — B9620 Unspecified Escherichia coli [E. coli] as the cause of diseases classified elsewhere: Secondary | ICD-10-CM | POA: Insufficient documentation

## 2023-04-05 DIAGNOSIS — E785 Hyperlipidemia, unspecified: Secondary | ICD-10-CM | POA: Diagnosis not present

## 2023-04-05 DIAGNOSIS — Z1152 Encounter for screening for COVID-19: Secondary | ICD-10-CM | POA: Diagnosis not present

## 2023-04-05 DIAGNOSIS — E1122 Type 2 diabetes mellitus with diabetic chronic kidney disease: Secondary | ICD-10-CM | POA: Insufficient documentation

## 2023-04-05 DIAGNOSIS — E039 Hypothyroidism, unspecified: Secondary | ICD-10-CM | POA: Diagnosis not present

## 2023-04-05 DIAGNOSIS — R627 Adult failure to thrive: Principal | ICD-10-CM | POA: Insufficient documentation

## 2023-04-05 DIAGNOSIS — G231 Progressive supranuclear ophthalmoplegia [Steele-Richardson-Olszewski]: Secondary | ICD-10-CM | POA: Insufficient documentation

## 2023-04-05 DIAGNOSIS — K219 Gastro-esophageal reflux disease without esophagitis: Secondary | ICD-10-CM | POA: Diagnosis not present

## 2023-04-05 DIAGNOSIS — R531 Weakness: Secondary | ICD-10-CM | POA: Diagnosis not present

## 2023-04-05 DIAGNOSIS — I10 Essential (primary) hypertension: Secondary | ICD-10-CM | POA: Diagnosis present

## 2023-04-05 DIAGNOSIS — A419 Sepsis, unspecified organism: Secondary | ICD-10-CM | POA: Diagnosis present

## 2023-04-05 DIAGNOSIS — I129 Hypertensive chronic kidney disease with stage 1 through stage 4 chronic kidney disease, or unspecified chronic kidney disease: Secondary | ICD-10-CM | POA: Diagnosis not present

## 2023-04-05 DIAGNOSIS — Z79899 Other long term (current) drug therapy: Secondary | ICD-10-CM | POA: Diagnosis not present

## 2023-04-05 DIAGNOSIS — N390 Urinary tract infection, site not specified: Secondary | ICD-10-CM | POA: Insufficient documentation

## 2023-04-05 DIAGNOSIS — Z7189 Other specified counseling: Secondary | ICD-10-CM

## 2023-04-05 DIAGNOSIS — Z789 Other specified health status: Secondary | ICD-10-CM | POA: Diagnosis present

## 2023-04-05 LAB — URINALYSIS, COMPLETE (UACMP) WITH MICROSCOPIC
Bilirubin Urine: NEGATIVE
Glucose, UA: NEGATIVE mg/dL
Ketones, ur: NEGATIVE mg/dL
Nitrite: NEGATIVE
Protein, ur: >=300 mg/dL — AB
Specific Gravity, Urine: 1.013 (ref 1.005–1.030)
WBC, UA: >50 WBC/hpf (ref 0–5)
pH: 5 (ref 5.0–8.0)

## 2023-04-05 LAB — CBC
HCT: 38.9 % (ref 36.0–46.0)
Hemoglobin: 13 g/dL (ref 12.0–15.0)
MCH: 29.1 pg (ref 26.0–34.0)
MCHC: 33.4 g/dL (ref 30.0–36.0)
MCV: 87.2 fL (ref 80.0–100.0)
Platelets: 240 10*3/uL (ref 150–400)
RBC: 4.46 MIL/uL (ref 3.87–5.11)
RDW: 13.1 % (ref 11.5–15.5)
WBC: 9.2 10*3/uL (ref 4.0–10.5)
nRBC: 0 % (ref 0.0–0.2)

## 2023-04-05 LAB — BASIC METABOLIC PANEL
Anion gap: 9 (ref 5–15)
BUN: 39 mg/dL — ABNORMAL HIGH (ref 8–23)
CO2: 21 mmol/L — ABNORMAL LOW (ref 22–32)
Calcium: 9.1 mg/dL (ref 8.9–10.3)
Chloride: 109 mmol/L (ref 98–111)
Creatinine, Ser: 1.12 mg/dL — ABNORMAL HIGH (ref 0.44–1.00)
GFR, Estimated: 52 mL/min — ABNORMAL LOW (ref >60)
Glucose, Bld: 163 mg/dL — ABNORMAL HIGH (ref 70–99)
Potassium: 3.7 mmol/L (ref 3.5–5.1)
Sodium: 139 mmol/L (ref 135–145)

## 2023-04-05 LAB — CBG MONITORING, ED: Glucose-Capillary: 113 mg/dL — ABNORMAL HIGH (ref 70–99)

## 2023-04-05 LAB — RESP PANEL BY RT-PCR (RSV, FLU A&B, COVID)  RVPGX2
Influenza A by PCR: NEGATIVE
Influenza B by PCR: NEGATIVE
Resp Syncytial Virus by PCR: NEGATIVE
SARS Coronavirus 2 by RT PCR: NEGATIVE

## 2023-04-05 MED ORDER — LEVOTHYROXINE SODIUM 25 MCG PO TABS
25.0000 ug | ORAL_TABLET | Freq: Every day | ORAL | Status: DC
  Administered 2023-04-06 – 2023-04-10 (×5): 25 ug via ORAL
  Filled 2023-04-05 (×5): qty 1

## 2023-04-05 MED ORDER — ESCITALOPRAM OXALATE 10 MG PO TABS
10.0000 mg | ORAL_TABLET | Freq: Every day | ORAL | Status: DC
  Administered 2023-04-06 – 2023-04-12 (×7): 10 mg via ORAL
  Filled 2023-04-05 (×7): qty 1

## 2023-04-05 MED ORDER — ACETAMINOPHEN 325 MG PO TABS
650.0000 mg | ORAL_TABLET | Freq: Four times a day (QID) | ORAL | Status: DC | PRN
  Administered 2023-04-06 (×3): 650 mg via ORAL
  Filled 2023-04-05 (×3): qty 2

## 2023-04-05 MED ORDER — PANTOPRAZOLE SODIUM 40 MG PO TBEC
40.0000 mg | DELAYED_RELEASE_TABLET | Freq: Two times a day (BID) | ORAL | Status: DC
  Administered 2023-04-06 – 2023-04-10 (×9): 40 mg via ORAL
  Filled 2023-04-05 (×9): qty 1

## 2023-04-05 MED ORDER — FENOFIBRATE 160 MG PO TABS
160.0000 mg | ORAL_TABLET | Freq: Every day | ORAL | Status: DC
  Administered 2023-04-06 – 2023-04-09 (×4): 160 mg via ORAL
  Filled 2023-04-05 (×4): qty 1

## 2023-04-05 MED ORDER — ATORVASTATIN CALCIUM 20 MG PO TABS
40.0000 mg | ORAL_TABLET | Freq: Every day | ORAL | Status: DC
  Administered 2023-04-05 – 2023-04-08 (×4): 40 mg via ORAL
  Filled 2023-04-05 (×4): qty 2

## 2023-04-05 MED ORDER — SODIUM CHLORIDE 0.9 % IV SOLN
2.0000 g | INTRAVENOUS | Status: DC
  Administered 2023-04-06 – 2023-04-07 (×2): 2 g via INTRAVENOUS
  Filled 2023-04-05 (×2): qty 20

## 2023-04-05 MED ORDER — ENOXAPARIN SODIUM 40 MG/0.4ML IJ SOSY
40.0000 mg | PREFILLED_SYRINGE | INTRAMUSCULAR | Status: DC
  Administered 2023-04-06 – 2023-04-08 (×4): 40 mg via SUBCUTANEOUS
  Filled 2023-04-05 (×4): qty 0.4

## 2023-04-05 MED ORDER — ONDANSETRON HCL 4 MG PO TABS: 4.0000 mg | ORAL_TABLET | Freq: Four times a day (QID) | ORAL | Status: DC | PRN

## 2023-04-05 MED ORDER — BUSPIRONE HCL 5 MG PO TABS
5.0000 mg | ORAL_TABLET | Freq: Two times a day (BID) | ORAL | Status: DC
  Administered 2023-04-06 – 2023-04-12 (×14): 5 mg via ORAL
  Filled 2023-04-05 (×15): qty 1

## 2023-04-05 MED ORDER — LISINOPRIL-HYDROCHLOROTHIAZIDE 20-12.5 MG PO TABS: 1.0000 | ORAL_TABLET | Freq: Every day | ORAL | Status: DC

## 2023-04-05 MED ORDER — TRAZODONE HCL 50 MG PO TABS
25.0000 mg | ORAL_TABLET | Freq: Every evening | ORAL | Status: DC | PRN
  Administered 2023-04-06 – 2023-04-08 (×2): 25 mg via ORAL
  Filled 2023-04-05 (×2): qty 1

## 2023-04-05 MED ORDER — CARVEDILOL 3.125 MG PO TABS
3.1250 mg | ORAL_TABLET | Freq: Two times a day (BID) | ORAL | Status: DC
  Administered 2023-04-06 – 2023-04-10 (×8): 3.125 mg via ORAL
  Filled 2023-04-05 (×9): qty 1

## 2023-04-05 MED ORDER — SODIUM CHLORIDE 0.9 % IV SOLN
1.0000 g | Freq: Once | INTRAVENOUS | Status: AC
  Administered 2023-04-05: 1 g via INTRAVENOUS
  Filled 2023-04-05: qty 10

## 2023-04-05 MED ORDER — OYSTER SHELL CALCIUM/D3 500-5 MG-MCG PO TABS
1.0000 | ORAL_TABLET | Freq: Every day | ORAL | Status: DC
  Administered 2023-04-06 – 2023-04-09 (×4): 1 via ORAL
  Filled 2023-04-05 (×4): qty 1

## 2023-04-05 MED ORDER — SODIUM CHLORIDE 0.9 % IV BOLUS
1000.0000 mL | Freq: Once | INTRAVENOUS | Status: AC
  Administered 2023-04-05: 1000 mL via INTRAVENOUS

## 2023-04-05 MED ORDER — LACTATED RINGERS IV SOLN
150.0000 mL/h | INTRAVENOUS | Status: DC
  Administered 2023-04-05 – 2023-04-06 (×2): 150 mL/h via INTRAVENOUS

## 2023-04-05 MED ORDER — ENSURE ENLIVE PO LIQD
237.0000 mL | Freq: Two times a day (BID) | ORAL | Status: DC
  Administered 2023-04-06 – 2023-04-10 (×10): 237 mL via ORAL

## 2023-04-05 MED ORDER — ACETAMINOPHEN 650 MG RE SUPP: 650.0000 mg | Freq: Four times a day (QID) | RECTAL | Status: DC | PRN

## 2023-04-05 MED ORDER — ONDANSETRON HCL 4 MG/2ML IJ SOLN: 4.0000 mg | Freq: Four times a day (QID) | INTRAMUSCULAR | Status: DC | PRN

## 2023-04-05 MED ORDER — MAGNESIUM HYDROXIDE 400 MG/5ML PO SUSP: 30.0000 mL | Freq: Every day | ORAL | Status: DC | PRN

## 2023-04-05 MED ORDER — AMLODIPINE BESYLATE 5 MG PO TABS
5.0000 mg | ORAL_TABLET | Freq: Every day | ORAL | Status: DC
  Administered 2023-04-06 – 2023-04-10 (×5): 5 mg via ORAL
  Filled 2023-04-05 (×5): qty 1

## 2023-04-05 NOTE — H&P (Signed)
 [place]   PATIENT NAME: Brittney Ray    MR#:  [id number]  DATE OF BIRTH:  04-11-1950  DATE OF ADMISSION:  04/05/2023  PRIMARY CARE PHYSICIAN: Marisue Ivan, MD   Patient is coming from: Home  REQUESTING/REFERRING PHYSICIAN: Minna Antis, MD  CHIEF COMPLAINT:   Chief Complaint  Patient presents with   Weakness    HISTORY OF PRESENT ILLNESS:  Brittney Ray is a 73 y.o. female with medical history significant for osteoarthritis, anxiety, type 2 diabetes mellitus, hypertension, hypothyroidism and supranuclear palsy, who presented to the emergency room with acute onset of generalized weakness and difficulty to be taking care of of by her husband who has been caring for her at home.  The patient has been having a progressive decline over the last week.  She has not been eating or drinking as much over the last few days.  No nausea or vomiting or diarrhea.  No chest pain or palpitations.  She admits to urinary frequency and urgency without dysuria or hematuria or flank pain.  No fever or chills.  No cough or wheezing.  No bleeding diathesis  ED Course: When she came to the ER, vital signs were within normal except for heart rate of 118 and later on respiratory rate was 23.  Labs revealed a BUN of 39 with a creatinine of 1.12 with glucose of 163 and CO2 of 21.  CBC was within normal.  Respiratory panel came back negative.  UA was positive for UTI EKG as reviewed by me : EKG showed sinus tachycardia with rate 111 with premature supraventricular complexes and right axis deviation with poor R wave progression. Imaging: None.  The patient was given IV Rocephin and a liter bolus of IV normal saline.  She will be admitted to a medical telemetry bed for further evaluation and management. PAST MEDICAL HISTORY:   Past Medical History:  Diagnosis Date   Anxiety    panic attacks; no curren med.   Arthritis    knees   Breast cancer (HCC) 2014   RT LUMPECTOMY DCIS    Diabetes mellitus    NIDDM   GERD (gastroesophageal reflux disease)    H/O hiatal hernia    Heart murmur    states never had any problems   Hypertension    under control, has been on med. > 10 yrs.   Hypothyroidism    Medial meniscus tear 04/2011   right knee   Nocturia    3-4 x/night   Paget's bone disease    hips   Personal history of radiation therapy 2014   right BREAST CA   PONV (postoperative nausea and vomiting)     PAST SURGICAL HISTORY:   Past Surgical History:  Procedure Laterality Date   ABDOMINAL HYSTERECTOMY  1978   partial   BACK SURGERY  04/1990, 1996   BIOPSY  02/21/2023   Procedure: BIOPSY;  Surgeon: Jaynie Collins, DO;  Location: [facility] ENDOSCOPY;  Service: Gastroenterology;;   BREAST BIOPSY Right 03/14/2012   2 areas UOQ of DCIS   BREAST CYST ASPIRATION Left    BREAST LUMPECTOMY Right 03/29/2012   right breast DCIS of two areas. Clear margins   BURCH PROCEDURE  09/1989   with rectal repair   CHOLECYSTECTOMY  11/2005   ESOPHAGOGASTRODUODENOSCOPY (EGD) WITH PROPOFOL N/A 02/21/2023   Procedure: ESOPHAGOGASTRODUODENOSCOPY (EGD) WITH PROPOFOL;  Surgeon: Jaynie Collins, DO;  Location: [facility] ENDOSCOPY;  Service: Gastroenterology;  Laterality: N/A;   FOOT SURGERY  1992   left   HAND SURGERY  10/1999   nerve repair right thumb   KNEE SURGERY  07/31/2009   right   KNEE SURGERY  2009   left   THYROID LOBECTOMY  03/1992   left    SOCIAL HISTORY:   Social History   Tobacco Use   Smoking status: Never   Smokeless tobacco: Never  Substance Use Topics   Alcohol use: No    FAMILY HISTORY:   Family History  Problem Relation Age of Onset   Breast cancer Mother    Breast cancer Sister 17   Breast cancer Paternal Aunt    Breast cancer Paternal Grandmother    Breast cancer Cousin        maternal side 1st cousin    DRUG ALLERGIES:   Allergies  Allergen Reactions   Contrast Media [Iodinated Contrast Media] Hives and Cough   Codeine  Anxiety   Morphine And Codeine Other (See Comments)    Tremors   Sulfa Antibiotics Nausea And Vomiting    REVIEW OF SYSTEMS:   ROS As per history of present illness. All pertinent systems were reviewed above. Constitutional, HEENT, cardiovascular, respiratory, GI, GU, musculoskeletal, neuro, psychiatric, endocrine, integumentary and hematologic systems were reviewed and are otherwise negative/unremarkable except for positive findings mentioned above in the HPI.   MEDICATIONS AT HOME:   Prior to Admission medications   Medication Sig Start Date End Date Taking? Authorizing Provider  ACCU-CHEK AVIVA PLUS test strip USE 1 STRIP TO CHECK GLUCOSE THREE TIMES DAILY 12/03/17   [provider]  alendronate (FOSAMAX) 70 MG tablet Take 70 mg by mouth once a week. Take with a full glass of water on an empty stomach.    [provider]  amLODipine (NORVASC) 5 MG tablet Take 5 mg by mouth daily.    [provider]  aspirin 325 MG tablet Take 325 mg by mouth daily.     [provider]  atorvastatin (LIPITOR) 40 MG tablet Take 40 mg by mouth at bedtime.    [provider]  busPIRone (BUSPAR) 5 MG tablet Take 5 mg by mouth 2 (two) times daily.    [provider]  Calcium Carb-Cholecalciferol 600-800 MG-UNIT TABS Take 1 tablet by mouth daily with breakfast.     [provider]  carvedilol (COREG) 3.125 MG tablet Take 3.125 mg by mouth 2 (two) times daily with a meal.    [provider]  escitalopram (LEXAPRO) 10 MG tablet Take 1 tablet (10 mg total) by mouth daily. 11/12/21 02/20/23  Willy Eddy, MD  feeding supplement (ENSURE ENLIVE / ENSURE PLUS) LIQD Take 237 mLs by mouth 2 (two) times daily between meals. 02/22/23   Enedina Finner, MD  fenofibrate 160 MG tablet Take 1 tablet (160 mg total) by mouth daily. 12/05/17   Salary, Jetty Duhamel D, MD  Glucose Blood (ACCU-CHEK AVIVA PLUS VI) 1 each as directed 07/25/14   [provider]   levothyroxine (SYNTHROID) 25 MCG tablet Take 25 mcg by mouth daily before breakfast.    [provider]  lisinopril-hydrochlorothiazide (ZESTORETIC) 20-12.5 MG tablet Take 1 tablet by mouth daily.    [provider]  metFORMIN (GLUCOPHAGE) 500 MG tablet Take 500 mg by mouth at bedtime.    [provider]  pantoprazole (PROTONIX) 40 MG tablet Take 1 tablet (40 mg total) by mouth 2 (two) times daily before a meal. 02/22/23   Enedina Finner, MD      VITAL SIGNS:  Blood pressure 133/78, pulse 94, temperature 99.5 F (37.5 C), temperature source Oral, resp. rate 18, height 5\' 1"  (1.549 m), weight 68 kg, SpO2 97%.  PHYSICAL EXAMINATION:  Physical Exam  GENERAL:  73 y.o.-year-old Caucasian female patient lying in the bed with no acute distress.  EYES: Pupils equal, round, reactive to light and accommodation. No scleral icterus. Extraocular muscles intact.  HEENT: Head atraumatic, normocephalic. Oropharynx and nasopharynx clear.  NECK:  Supple, no jugular venous distention. No thyroid enlargement, no tenderness.  LUNGS: Normal breath sounds bilaterally, no wheezing, rales,rhonchi or crepitation. No use of accessory muscles of respiration.  CARDIOVASCULAR: Regular rate and rhythm, S1, S2 normal. No murmurs, rubs, or gallops.  ABDOMEN: Soft, nondistended, nontender. Bowel sounds present. No organomegaly or mass.  EXTREMITIES: No pedal edema, cyanosis, or clubbing.  NEUROLOGIC: Cranial nerves II through XII are intact. Muscle strength 5/5 in all extremities. Sensation intact. Gait not checked.  PSYCHIATRIC: The patient is alert and oriented x 3.  Normal affect and good eye contact. SKIN: No obvious rash, lesion, or ulcer.   LABORATORY PANEL:   CBC Recent Labs  Lab 04/05/23 1504  WBC 9.2  HGB 13.0  HCT 38.9  PLT 240   ------------------------------------------------------------------------------------------------------------------  Chemistries  Recent Labs  Lab  04/05/23 1504  NA 139  K 3.7  CL 109  CO2 21*  GLUCOSE 163*  BUN 39*  CREATININE 1.12*  CALCIUM 9.1   ------------------------------------------------------------------------------------------------------------------  Cardiac Enzymes No results for input(s): "TROPONINI" in the last 168 hours. ------------------------------------------------------------------------------------------------------------------  RADIOLOGY:  No results found.    IMPRESSION AND PLAN:  Assessment and Plan: * Sepsis due to gram-negative UTI (HCC) - Sepsis is manifested by tachycardia and tachypnea. - The patient will be admitted to a medical telemetry bed. - We will continue antibiotic therapy with IV Rocephin. - We will follow blood and urine cultures. - The patient will be hydrated with IV lactated ringer.  Generalized weakness - This is like secondary to #1. - PT consult to be obtained  Hypothyroidism - We will continue Synthroid and check TSH.  Essential hypertension - We will continue antihypertensive therapy.  GERD without esophagitis - We will continue PPI therapy.  Type 2 diabetes mellitus without complications (HCC) - The patient be placed on supplemental coverage with NovoLog. - We will hold off metformin.  Dyslipidemia - We will continue fenofibrate.  PSP (progressive supranuclear palsy) (HCC) - We will continue her Sinemet  Anxiety - We will continue BuSpar and Lexapro.    DVT prophylaxis: Lovenox.  Advanced Care Planning:  Code Status: She is DNR and DNI. Family Communication:  The plan of care was discussed in details with the patient (and family). I answered all questions. The patient agreed to proceed with the above mentioned plan. Further management will depend upon hospital course. Disposition Plan: Back to previous home environment Consults called: none.  All the records are reviewed and case discussed with ED provider.  Status is: Inpatient   At the time  of the admission, it appears that the appropriate admission status for this patient is inpatient.  This is judged to be reasonable and necessary in order to provide the required intensity of service to ensure the patient's safety given the presenting symptoms, physical exam findings and initial radiographic and laboratory data in the context of comorbid conditions.  The patient requires inpatient status due to high intensity of service, high risk of further deterioration and high frequency of surveillance required.  I certify that at the  time of admission, it is my clinical judgment that the patient will require inpatient hospital care extending more than 2 midnights.                            Dispo: The patient is from: Home              Anticipated d/c is to: Home              Patient currently is not medically stable to d/c.              Difficult to place patient: No  Hannah Beat M.D on 04/05/2023 at 11:49 PM  [facility]   From 7 PM-7 AM, contact night-coverage [email or web address]  CC: Primary care physician; Marisue Ivan, MD

## 2023-04-05 NOTE — Assessment & Plan Note (Signed)
-   We will continue her Sinemet

## 2023-04-05 NOTE — Assessment & Plan Note (Signed)
 -  We will continue Synthroid and check TSH.

## 2023-04-05 NOTE — ED Triage Notes (Signed)
 Patient to ED via [facility] from home for increased weakness. Worsening over the weekend- had a fall no known injuries. Normally walks but has been unable to. Hx PSP (dementia). Not been eating like normal. PT noted to be leaning to the right- normal per family.

## 2023-04-05 NOTE — Assessment & Plan Note (Signed)
-   Sepsis is manifested by tachycardia and tachypnea. - The patient will be admitted to a medical telemetry bed. - We will continue antibiotic therapy with IV Rocephin. - We will follow blood and urine cultures. - The patient will be hydrated with IV lactated ringer.

## 2023-04-05 NOTE — Assessment & Plan Note (Signed)
-   We will continue antihypertensive therapy.

## 2023-04-05 NOTE — ED Provider Triage Note (Signed)
 Emergency Medicine Provider Triage Evaluation Note  Brittney Ray , a 73 y.o. female  was evaluated in triage.  Pt complains of per family member decreased appetite, increased weakness worse over the weekend.  + hx of dementia.  Here via EMS  Review of Systems  Positive: weakness Negative: No viral sx.  Physical Exam  BP 136/79 (BP Location: Left Arm)   Pulse (!) 118   Temp 98.5 F (36.9 C) (Oral)   Resp 17   Ht 5\' 1"  (1.549 m)   Wt 68 kg   SpO2 99%   BMI 28.34 kg/m  Gen:   Awake, no distress   Resp:  Normal effort  MSK:   Moves extremities without difficulty  Other:    Medical Decision Making  Medically screening exam initiated at 3:06 PM.  Appropriate orders placed.  Brittney Ray was informed that the remainder of the evaluation will be completed by another provider, this initial triage assessment does not replace that evaluation, and the importance of remaining in the ED until their evaluation is complete.     Tommi Rumps, PA-C 04/05/23 [phone]

## 2023-04-05 NOTE — ED Notes (Signed)
 Spouse at bedside states that pt has become unable to walk, unable to feed herself and is "out of it" for the "last couple of days".

## 2023-04-05 NOTE — ED Triage Notes (Signed)
 First nurse note: Pt here via [facility] from home with c/o of failure to thrive, pt has HX of dementia.   140/89 HR: 100 98% RA

## 2023-04-05 NOTE — Assessment & Plan Note (Signed)
-   We will continue BuSpar and Lexapro.

## 2023-04-05 NOTE — Assessment & Plan Note (Signed)
 -  We will continue PPI therapy

## 2023-04-05 NOTE — Assessment & Plan Note (Signed)
-   The patient be placed on supplemental coverage with NovoLog. - We will hold off metformin.

## 2023-04-05 NOTE — Assessment & Plan Note (Signed)
-   This is like secondary to #1. - PT consult to be obtained

## 2023-04-05 NOTE — Assessment & Plan Note (Addendum)
Triglycerides are normal - Continue fenofibrate

## 2023-04-05 NOTE — ED Provider Notes (Signed)
 [facility] Provider Note    Event Date/Time   First MD Initiated Contact with Patient 04/05/23 2018     (approximate)  History   Chief Complaint: Weakness  HPI  Brittney Ray is a 73 y.o. female with a past medical history of anxiety, gastric reflux, supranulcear palsy, presents to the emergency department for increased weakness and inability to care for the patient.  Patient is coming with her husband, husband states he has been attempting to care for the patient as best he could at home however this has been a progressive decline but over the past 1 week he has gotten to the point where he is no longer able to safely help the patient.  States he has no help at home.  Is unable to lift the patient and the patient has been stuck in bed.  Patient states she has not been eating or drinking as much last few days either.  He denies any nausea vomiting diarrhea fever cough or congestion.  Physical Exam   Triage Vital Signs: ED Triage Vitals [04/05/23 1500]  Encounter Vitals Group     BP 136/79     Systolic BP Percentile      Diastolic BP Percentile      Pulse Rate (!) 118     Resp 17     Temp 98.5 F (36.9 C)     Temp Source Oral     SpO2 99 %     Weight 150 lb (68 kg)     Height 5\' 1"  (1.549 m)     Head Circumference      Peak Flow      Pain Score 0     Pain Loc      Pain Education      Exclude from Growth Chart     Most recent vital signs: Vitals:   04/05/23 1500 04/05/23 2030  BP: 136/79 133/78  Pulse: (!) 118 94  Resp: 17 18  Temp: 98.5 F (36.9 C) 99.5 F (37.5 C)  SpO2: 99% 97%    General: Awake, no distress.  Calm, but for the most part does not answer any questions. CV:  Good peripheral perfusion.  Regular rate and rhythm  Resp:  Normal effort.  Equal breath sounds bilaterally.  Abd:  No distention.  Soft, nontender.  No rebound or guarding.  ED Results / Procedures / Treatments   EKG  EKG viewed and interpreted by  myself shows sinus tachycardia 111 bpm with a narrow QRS, normal axis, normal intervals, no concerning ST changes.  MEDICATIONS ORDERED IN ED: Medications  sodium chloride 0.9 % bolus 1,000 mL (has no administration in time range)     IMPRESSION / MDM / ASSESSMENT AND PLAN / ED COURSE  I reviewed the triage vital signs and the nursing notes.  Patient's presentation is most consistent with acute presentation with potential threat to life or bodily function.  Patient presents emergency department for worsening weakness, decline of function to the point where the husband is no longer able to care for the patient at home.  Patient's workup in the emergency department shows no significant findings, reassuring CBC, reassuring chemistry.  Patient's pulse rate is slightly elevated around 100 bpm somewhat dry appearing mucous membranes we will dose 1 L of normal saline.  Will attempt to obtain a urine sample as well as a COVID/flu swab looking for any reason for the patient's increased weakness.  However the patient has a progressive neurologic  disorder which could explain the patient's worsening symptoms and weakness.  Patient has workup today has resulted showing a reassuring CBC and chemistry however the patient's urinalysis is concerning for significant urinary tract infection with many bacteria and greater than 50 white cells.  Given the patient's increased weakness over the past 5 days or so per husband we will treat with IV Rocephin and admit to the hospitalist service.  Patient will likely need social work for either home health care or nursing facility placement.  Has been agreeable to plan.  FINAL CLINICAL IMPRESSION(S) / ED DIAGNOSES   Weakness Urinary tract infection  Note:  This document was prepared using Dragon voice recognition software and may include unintentional dictation errors.   Minna Antis, MD 04/05/23 2208

## 2023-04-05 NOTE — ED Notes (Signed)
 Pt provided sandwich tray and water.

## 2023-04-06 DIAGNOSIS — A419 Sepsis, unspecified organism: Secondary | ICD-10-CM | POA: Diagnosis present

## 2023-04-06 DIAGNOSIS — N390 Urinary tract infection, site not specified: Secondary | ICD-10-CM | POA: Diagnosis not present

## 2023-04-06 DIAGNOSIS — A4150 Gram-negative sepsis, unspecified: Secondary | ICD-10-CM | POA: Diagnosis not present

## 2023-04-06 LAB — BASIC METABOLIC PANEL
Anion gap: 7 (ref 5–15)
BUN: 40 mg/dL — ABNORMAL HIGH (ref 8–23)
CO2: 21 mmol/L — ABNORMAL LOW (ref 22–32)
Calcium: 8.3 mg/dL — ABNORMAL LOW (ref 8.9–10.3)
Chloride: 109 mmol/L (ref 98–111)
Creatinine, Ser: 1.36 mg/dL — ABNORMAL HIGH (ref 0.44–1.00)
GFR, Estimated: 41 mL/min — ABNORMAL LOW (ref >60)
Glucose, Bld: 177 mg/dL — ABNORMAL HIGH (ref 70–99)
Potassium: 3.6 mmol/L (ref 3.5–5.1)
Sodium: 137 mmol/L (ref 135–145)

## 2023-04-06 LAB — CBC
HCT: 32.6 % — ABNORMAL LOW (ref 36.0–46.0)
Hemoglobin: 10.9 g/dL — ABNORMAL LOW (ref 12.0–15.0)
MCH: 29.2 pg (ref 26.0–34.0)
MCHC: 33.4 g/dL (ref 30.0–36.0)
MCV: 87.4 fL (ref 80.0–100.0)
Platelets: 164 10*3/uL (ref 150–400)
RBC: 3.73 MIL/uL — ABNORMAL LOW (ref 3.87–5.11)
RDW: 13.2 % (ref 11.5–15.5)
WBC: 7 10*3/uL (ref 4.0–10.5)
nRBC: 0 % (ref 0.0–0.2)

## 2023-04-06 LAB — PROTIME-INR
INR: 1.3 — ABNORMAL HIGH (ref 0.8–1.2)
Prothrombin Time: 15.9 s — ABNORMAL HIGH (ref 11.4–15.2)

## 2023-04-06 LAB — CORTISOL-AM, BLOOD: Cortisol - AM: 11.7 ug/dL (ref 6.7–22.6)

## 2023-04-06 MED ORDER — FENTANYL CITRATE PF 50 MCG/ML IJ SOSY
25.0000 ug | PREFILLED_SYRINGE | INTRAMUSCULAR | Status: DC | PRN
  Administered 2023-04-06: 25 ug via INTRAVENOUS
  Filled 2023-04-06: qty 1

## 2023-04-06 MED ORDER — LISINOPRIL 20 MG PO TABS
20.0000 mg | ORAL_TABLET | Freq: Every day | ORAL | Status: DC
  Administered 2023-04-06 – 2023-04-10 (×4): 20 mg via ORAL
  Filled 2023-04-06 (×5): qty 1

## 2023-04-06 MED ORDER — HYDROCHLOROTHIAZIDE 12.5 MG PO TABS
12.5000 mg | ORAL_TABLET | Freq: Every day | ORAL | Status: DC
  Administered 2023-04-06 – 2023-04-10 (×4): 12.5 mg via ORAL
  Filled 2023-04-06 (×5): qty 1

## 2023-04-06 NOTE — Progress Notes (Signed)
 PT Cancellation Note  Patient Details Name: Brittney Ray MRN: [id number] DOB: May 16, 1950   Cancelled Treatment:    Reason Eval/Treat Not Completed: Pain limiting ability to participate (Pt reporting 10/10 headache on authors arrival, looks very uncomfortable. RN made aware. WIll defer PT evaluation to later date/time.)  1:48 PM, 04/06/23 Rosamaria Lints, PT, DPT Physical Therapist - [facility]  [phone] (ASCOM)    Aydan Phoenix C 04/06/2023, 1:47 PM

## 2023-04-06 NOTE — Plan of Care (Signed)
  Problem: Clinical Measurements: Goal: Diagnostic test results will improve Outcome: Progressing   Problem: Clinical Measurements: Goal: Diagnostic test results will improve Outcome: Progressing   Problem: Nutrition: Goal: Adequate nutrition will be maintained Outcome: Progressing   Problem: Coping: Goal: Level of anxiety will decrease Outcome: Progressing   Problem: Safety: Goal: Ability to remain free from injury will improve Outcome: Progressing

## 2023-04-06 NOTE — Care Management Obs Status (Signed)
 MEDICARE OBSERVATION STATUS NOTIFICATION   Patient Details  Name: Brittney Ray MRN: [id number] Date of Birth: February 19, 1950   Medicare Observation Status Notification Given:  Yes    [place] Brannen Koppen, LCSW 04/06/2023, 6:24 PM

## 2023-04-06 NOTE — Progress Notes (Signed)
 PROGRESS NOTE   HPI was taken from Dr. Arville Care: Brittney Ray is a 73 y.o. female with medical history significant for osteoarthritis, anxiety, type 2 diabetes mellitus, hypertension, hypothyroidism and supranuclear palsy, who presented to the emergency room with acute onset of generalized weakness and difficulty to be taking care of of by her husband who has been caring for her at home.  The patient has been having a progressive decline over the last week.  She has not been eating or drinking as much over the last few days.  No nausea or vomiting or diarrhea.  No chest pain or palpitations.  She admits to urinary frequency and urgency without dysuria or hematuria or flank pain.  No fever or chills.  No cough or wheezing.  No bleeding diathesis   ED Course: When she came to the ER, vital signs were within normal except for heart rate of 118 and later on respiratory rate was 23.  Labs revealed a BUN of 39 with a creatinine of 1.12 with glucose of 163 and CO2 of 21.  CBC was within normal.  Respiratory panel came back negative.  UA was positive for UTI EKG as reviewed by me : EKG showed sinus tachycardia with rate 111 with premature supraventricular complexes and right axis deviation with poor R wave progression. Imaging: None.   The patient was given IV Rocephin and a liter bolus of IV normal saline.  She will be admitted to a medical telemetry bed for further evaluation and management.   Brittney Ray  [id number]:[id number] DOB: Jan 11, 1951 DOA: 04/05/2023 PCP: Marisue Ivan, MD   Assessment & Plan:   Principal Problem:   Sepsis due to gram-negative UTI [facility]) Active Problems:   Generalized weakness   Hypothyroidism   Essential hypertension   Anxiety   PSP (progressive supranuclear palsy) (HCC)   Dyslipidemia   Type 2 diabetes mellitus without complications (HCC)   GERD without esophagitis  Assessment and Plan: Sepsis: met criteria w/ tachycardia, tachypnea, & likely UTI.  Continue on IV rocephin. Urine cx is pending. Blood cxs are pending.   Likely UTI: UA is positive. Urine cx is pending. Continue on IV rocephin   Generalized weakness: PT/OT consulted    Hypothyroidism: continue on home dose of levothyroxine    HTN: continue on lisinopril, hydrochlorothiazide, amlodipine    GERD: continue on PPI    DM2: likely well controlled. No need for SSI currently   HLD: continue on home dose of fenofibrate, statin   Progressive supranuclear palsy: continue on home dose of sinemet    Anxiety: severity unknown. Continue on home dose of buspar, lexapro      DVT prophylaxis: lovenox  Code Status: DNR Family Communication: discussed pt's care w/ pt's sister at bedside and answered her questions  Disposition Plan: depends on PT/OT recs   Level of care: Telemetry Medical  Status is: Inpatient Remains inpatient appropriate because: severity of illness     Consultants:    Procedures:  Antimicrobials: rocephin    Subjective: Pt c/o fatigue   Objective: Vitals:   04/05/23 2030 04/05/23 2359 04/06/23 0409 04/06/23 0751  BP: 133/78 (!) 173/82 (!) 138/50 (!) 168/83  Pulse: 94 78 80   Resp: 18  17 18   Temp: 99.5 F (37.5 C) 99 F (37.2 C) 97.8 F (36.6 C) (!) 97 F (36.1 C)  TempSrc: Oral   Oral  SpO2: 97% 100% 98% 100%  Weight:      Height:  Intake/Output Summary (Last 24 hours) at 04/06/2023 0836 Last data filed at 04/06/2023 0349 Gross per 24 hour  Intake 682.2 ml  Output --  Net 682.2 ml   Filed Weights   04/05/23 1500  Weight: 68 kg    Examination:  General exam: Appears calm and comfortable  Respiratory system: Clear to auscultation. Respiratory effort normal. Cardiovascular system: S1 & S2 +. No rubs, gallops or clicks. Gastrointestinal system: Abdomen is nondistended, soft and nontender. Normal bowel sounds heard. Central nervous system: Alert and awake. Moves all extremities  Psychiatry: Judgement and insight  appears at baseline. Flat mood and affect     Data Reviewed: I have personally reviewed following labs and imaging studies  CBC: Recent Labs  Lab 04/05/23 1504 04/06/23 0102  WBC 9.2 7.0  HGB 13.0 10.9*  HCT 38.9 32.6*  MCV 87.2 87.4  PLT 240 164   Basic Metabolic Panel: Recent Labs  Lab 04/05/23 1504 04/06/23 0102  NA 139 137  K 3.7 3.6  CL 109 109  CO2 21* 21*  GLUCOSE 163* 177*  BUN 39* 40*  CREATININE 1.12* 1.36*  CALCIUM 9.1 8.3*   GFR: Estimated Creatinine Clearance: 32.5 mL/min (A) (by C-G formula based on SCr of 1.36 mg/dL (H)). Liver Function Tests: No results for input(s): "AST", "ALT", "ALKPHOS", "BILITOT", "PROT", "ALBUMIN" in the last 168 hours. No results for input(s): "LIPASE", "AMYLASE" in the last 168 hours. No results for input(s): "AMMONIA" in the last 168 hours. Coagulation Profile: Recent Labs  Lab 04/06/23 0102  INR 1.3*   Cardiac Enzymes: No results for input(s): "CKTOTAL", "CKMB", "CKMBINDEX", "TROPONINI" in the last 168 hours. BNP (last 3 results) No results for input(s): "PROBNP" in the last 8760 hours. HbA1C: No results for input(s): "HGBA1C" in the last 72 hours. CBG: Recent Labs  Lab 04/05/23 2132  GLUCAP 113*   Lipid Profile: No results for input(s): "CHOL", "HDL", "LDLCALC", "TRIG", "CHOLHDL", "LDLDIRECT" in the last 72 hours. Thyroid Function Tests: No results for input(s): "TSH", "T4TOTAL", "FREET4", "T3FREE", "THYROIDAB" in the last 72 hours. Anemia Panel: No results for input(s): "VITAMINB12", "FOLATE", "FERRITIN", "TIBC", "IRON", "RETICCTPCT" in the last 72 hours. Sepsis Labs: No results for input(s): "PROCALCITON", "LATICACIDVEN" in the last 168 hours.  Recent Results (from the past 240 hours)  Resp panel by RT-PCR (RSV, Flu A&B, Covid) Urine, Clean Catch     Status: None   Collection Time: 04/05/23  9:01 PM   Specimen: Urine, Clean Catch; Nasal Swab  Result Value Ref Range Status   SARS Coronavirus 2 by RT PCR  NEGATIVE NEGATIVE Final    Comment: (NOTE) SARS-CoV-2 target nucleic acids are NOT DETECTED.  The SARS-CoV-2 RNA is generally detectable in upper respiratory specimens during the acute phase of infection. The lowest concentration of SARS-CoV-2 viral copies this assay can detect is 138 copies/mL. A negative result does not preclude SARS-Cov-2 infection and should not be used as the sole basis for treatment or other patient management decisions. A negative result may occur with  improper specimen collection/handling, submission of specimen other than nasopharyngeal swab, presence of viral mutation(s) within the areas targeted by this assay, and inadequate number of viral copies(<138 copies/mL). A negative result must be combined with clinical observations, patient history, and epidemiological information. The expected result is Negative.  Fact Sheet for Patients:  BloggerCourse.com  Fact Sheet for Healthcare Providers:  SeriousBroker.it  This test is no t yet approved or cleared by the [place] and  has been authorized for  detection and/or diagnosis of SARS-CoV-2 by FDA under an Emergency Use Authorization (EUA). This EUA will remain  in effect (meaning this test can be used) for the duration of the COVID-19 declaration under Section 564(b)(1) of the Act, 21 U.S.C.section 360bbb-3(b)(1), unless the authorization is terminated  or revoked sooner.       Influenza A by PCR NEGATIVE NEGATIVE Final   Influenza B by PCR NEGATIVE NEGATIVE Final    Comment: (NOTE) The Xpert Xpress SARS-CoV-2/FLU/RSV plus assay is intended as an aid in the diagnosis of influenza from Nasopharyngeal swab specimens and should not be used as a sole basis for treatment. Nasal washings and aspirates are unacceptable for Xpert Xpress SARS-CoV-2/FLU/RSV testing.  Fact Sheet for Patients: BloggerCourse.com  Fact Sheet for  Healthcare Providers: SeriousBroker.it  This test is not yet approved or cleared by the [place] FDA and has been authorized for detection and/or diagnosis of SARS-CoV-2 by FDA under an Emergency Use Authorization (EUA). This EUA will remain in effect (meaning this test can be used) for the duration of the COVID-19 declaration under Section 564(b)(1) of the Act, 21 U.S.C. section 360bbb-3(b)(1), unless the authorization is terminated or revoked.     Resp Syncytial Virus by PCR NEGATIVE NEGATIVE Final    Comment: (NOTE) Fact Sheet for Patients: BloggerCourse.com  Fact Sheet for Healthcare Providers: SeriousBroker.it  This test is not yet approved or cleared by the [place] FDA and has been authorized for detection and/or diagnosis of SARS-CoV-2 by FDA under an Emergency Use Authorization (EUA). This EUA will remain in effect (meaning this test can be used) for the duration of the COVID-19 declaration under Section 564(b)(1) of the Act, 21 U.S.C. section 360bbb-3(b)(1), unless the authorization is terminated or revoked.  Performed at [facility], [address] [place], [place] [address]          Radiology Studies: No results found.      Scheduled Meds:  amLODipine  5 mg Oral Daily   atorvastatin  40 mg Oral QHS   busPIRone  5 mg Oral BID   calcium-vitamin D  1 tablet Oral Q breakfast   carvedilol  3.125 mg Oral BID WC   enoxaparin (LOVENOX) injection  40 mg Subcutaneous Q24H   escitalopram  10 mg Oral Daily   feeding supplement  237 mL Oral BID BM   fenofibrate  160 mg Oral Daily   lisinopril  20 mg Oral Daily   And   hydrochlorothiazide  12.5 mg Oral Daily   levothyroxine  25 mcg Oral Q0600   pantoprazole  40 mg Oral BID AC   Continuous Infusions:  cefTRIAXone (ROCEPHIN)  IV     lactated ringers 150 mL/hr (04/06/23 6045)     LOS: 1 day       Charise Killian, MD [facility] Pager [phone]  If 7PM-7AM, please contact night-coverage [email or web address] 04/06/2023, 8:36 AM

## 2023-04-06 NOTE — Evaluation (Addendum)
 Occupational Therapy Evaluation Patient Details Name: RASHAUNDA RAHL MRN: [id number] DOB: 27-Apr-1950 Today's Date: 04/06/2023   History of Present Illness   MACKENZE GRANDISON is a 72yoF presented to the ED with progressing weakness and inability to meet care needs by her husband.  PMH dementia, progressive supranuclear palsy, HTN, HLD, DM, CAD, TIA, hypothyroidism, depression with anxiety, CKD-3A, Paget bone disease, breast cancer (s/p of right lumpectomy, radiation therapy), GIB, ABLA.   Clinical Impressions Ms Girardot was seen for OT evaluation this date. Per chart, prior to hospital admission pt required +1 assist from family for mobility and ADLs. Pt lives with spouse. Pt presents to acute OT demonstrating impaired ADL performance and functional mobility 2/2 decreased activity tolerance and functional strength/ROM/balance deficits. On arrival pt reports headache, RN in to provide pain meds start of session.   Pt currently requires MOD A for bed mobility. Initial MIN A improving to CGA static sitting. Noted to have incontinent stool in bed. MAX A + RW for BSC t/f and pericare standing. Recommend +2 for all mobility. Pt would benefit from skilled OT to address noted impairments and functional limitations (see below for any additional details). Upon hospital discharge, recommend OT follow up <3 hours/day.    If plan is discharge home, recommend the following:   Two people to help with walking and/or transfers;Two people to help with bathing/dressing/bathroom     Functional Status Assessment   Patient has had a recent decline in their functional status and demonstrates the ability to make significant improvements in function in a reasonable and predictable amount of time.     Equipment Recommendations   Other (comment) (defer)     Recommendations for Other Services         Precautions/Restrictions   Precautions Precautions: Fall Recall of Precautions/Restrictions:  Impaired Restrictions Weight Bearing Restrictions Per Provider Order: No     Mobility Bed Mobility Overal bed mobility: Needs Assistance Bed Mobility: Supine to Sit, Sit to Supine     Supine to sit: Mod assist Sit to supine: Mod assist        Transfers Overall transfer level: Needs assistance Equipment used: Rolling walker (2 wheels) Transfers: Sit to/from Stand, Bed to chair/wheelchair/BSC Sit to Stand: Mod assist, From elevated surface     Step pivot transfers: Max assist     General transfer comment: would benefit from +2      Balance Overall balance assessment: Needs assistance Sitting-balance support: Feet supported, Bilateral upper extremity supported Sitting balance-Leahy Scale: Fair     Standing balance support: Bilateral upper extremity supported Standing balance-Leahy Scale: Zero                             ADL either performed or assessed with clinical judgement   ADL Overall ADL's : Needs assistance/impaired                                       General ADL Comments: MAX A + RW for BSC t/f and pericare standing.      Pertinent Vitals/Pain Pain Assessment Pain Assessment: 0-10 Pain Score: 10-Worst pain ever Pain Location: headache Pain Descriptors / Indicators: Headache Pain Intervention(s): RN gave pain meds during session     Extremity/Trunk Assessment Upper Extremity Assessment Upper Extremity Assessment: Generalized weakness   Lower Extremity Assessment Lower Extremity Assessment: Generalized weakness  Communication Communication Communication: No apparent difficulties   Cognition Arousal: Alert Behavior During Therapy: Flat affect Cognition: History of cognitive impairments                               Following commands: Impaired       Cueing  General Comments   Cueing Techniques: Verbal cues;Tactile cues      Exercises     Shoulder Instructions      Home Living  Family/patient expects to be discharged to:: Private residence Living Arrangements: Spouse/significant other Available Help at Discharge: Family;Available 24 hours/day;Personal care attendant Type of Home: House Home Access: Stairs to enter [employer] of Steps: 2-3 Entrance Stairs-Rails: None Home Layout: One level     Bathroom Shower/Tub: Producer, television/film/video: Standard     Home Equipment: Agricultural consultant (2 wheels);Shower seat;Hospital bed   Additional Comments: Sister, spouse, and PCA provide supervision for patient      Prior Functioning/Environment Prior Level of Function : History of Falls (last six months)             Mobility Comments: HH ambulation with a RW with close SBA, w/c for community access, 6 falls in the last 6 months secondary to LOB ADLs Comments: Assist for all ADLs    OT Problem List: Decreased strength;Decreased range of motion;Decreased activity tolerance;Impaired balance (sitting and/or standing)   OT Treatment/Interventions: Self-care/ADL training;Therapeutic exercise;Energy conservation;DME and/or AE instruction;Therapeutic activities      OT Goals(Current goals can be found in the care plan section)   Acute Rehab OT Goals Patient Stated Goal: to go home OT Goal Formulation: With patient Time For Goal Achievement: 04/20/23 Potential to Achieve Goals: Fair ADL Goals Pt Will Perform Grooming: with set-up;with supervision;sitting Pt Will Perform Lower Body Dressing: with mod assist;sit to/from stand Pt Will Transfer to Toilet: with min assist;ambulating;bedside commode   OT Frequency:  Min 1X/week    Co-evaluation              AM-PAC OT "6 Clicks" Daily Activity     Outcome Measure Help from another person eating meals?: A Lot Help from another person taking care of personal grooming?: A Lot Help from another person toileting, which includes using toliet, bedpan, or urinal?: A Lot Help from another person  bathing (including washing, rinsing, drying)?: A Lot Help from another person to put on and taking off regular upper body clothing?: A Lot Help from another person to put on and taking off regular lower body clothing?: A Lot 6 Click Score: 12   End of Session Equipment Utilized During Treatment: Rolling walker (2 wheels);Gait belt  Activity Tolerance: Patient tolerated treatment well Patient left: in bed;with call bell/phone within reach;with bed alarm set  OT Visit Diagnosis: Other abnormalities of gait and mobility (R26.89);Muscle weakness (generalized) (M62.81)                Time: 8119-1478 OT Time Calculation (min): 19 min Charges:  OT General Charges $OT Visit: 1 Visit OT Evaluation $OT Eval Moderate Complexity: 1 Mod  Kathie Dike, M.S. OTR/L  04/06/23, 3:22 PM  ascom [phone]

## 2023-04-06 NOTE — Care Management CC44 (Signed)
 Condition Code 44 Documentation Completed  Patient Details  Name: Brittney Ray MRN: [id number] Date of Birth: Nov 16, 1950   Condition Code 44 given:  Yes Patient signature on Condition Code 44 notice:  Yes Documentation of 2 MD's agreement:  Yes Code 44 added to claim:  Yes    [name], LCSW 04/06/2023, 6:25 PM

## 2023-04-07 DIAGNOSIS — R531 Weakness: Secondary | ICD-10-CM | POA: Diagnosis not present

## 2023-04-07 NOTE — Plan of Care (Signed)
  Problem: Clinical Measurements: Goal: Diagnostic test results will improve Outcome: Progressing   Problem: Clinical Measurements: Goal: Diagnostic test results will improve Outcome: Progressing   Problem: Nutrition: Goal: Adequate nutrition will be maintained Outcome: Progressing   Problem: Coping: Goal: Level of anxiety will decrease Outcome: Progressing   Problem: Pain Managment: Goal: General experience of comfort will improve and/or be controlled Outcome: Progressing   Problem: Safety: Goal: Ability to remain free from injury will improve Outcome: Progressing

## 2023-04-07 NOTE — Hospital Course (Addendum)
 72yo with h/o anxiety, DM, HTN, hypothyroidism, and supranuclear palsy who presented on 3/24 with generalized weakness, failure to thrive.  She was diagnosed with sepsis from UTI and started on Ceftriaxone.  In discussion with family, this appears to be subacute decompensation and family acknowledges need for placement.  They have been involved with palliative care but are open to hospice.

## 2023-04-07 NOTE — Progress Notes (Signed)
 Progress Note   Patient: Brittney Ray QMV:[id number] DOB: [birth year] DOA: 04/05/2023     1 DOS: the patient was seen and examined on 04/07/2023   Brief hospital course: 73yo with h/o anxiety, DM, HTN, hypothyroidism, and supranuclear palsy who presented on 3/24 with generalized weakness, failure to thrive.  She was diagnosed with sepsis from UTI and started on Ceftriaxone.  In discussion with family, this appears to be subacute decompensation and family acknowledges need for placement.  They have been involved with palliative care but are open to hospice.  Assessment and Plan:  Generalized weakness/progressive ambulatory dysfunction Possibly secondary to UTI However, family acknowledges a progressive pattern which may be suggestive of terminal dementia due to PSP PT/OT consulted She appears likely to need placement in SNF, +/- hospice Family is open to palliative care assistance, consult requested  E coli UTI Sepsis ruled out Sensitivities pending Continue ceftriaxone   Hypothyroidism Continue Synthroid    Essential hypertension Continue amlodipine, Zestoretic   GERD without esophagitis Continue PPI   Type 2 diabetes mellitus without complications Last A1c (remote) was 5 Hold metformin Based on overall prognosis, it is unlikely that she will benefit from tight glycemic control and it may be reasonable to stop accuchecks  Stage 3 CKD Baseline varies between 3a and 3b Appears to be at/next baseline at this time Recheck BMP in AM   Dyslipidemia Continue fenofibrate, atorvastatin   PSP (progressive supranuclear palsy)  Continue Sinemet   Anxiety Continue buspirone and escitalopram      Consultants: PT OT TOC team  Procedures: None  Antibiotics: Ceftriaxone 3/24-31  30 Day Unplanned Readmission Risk Score    Flowsheet Row ED to Hosp-Admission (Current) from 04/05/2023 in [facility] (1A)  30 Day Unplanned  Readmission Risk Score (%) 28.07 Filed at 04/06/2023 1600       This score is the patient's risk of an unplanned readmission within 30 days of being discharged (0 -100%). The score is based on dignosis, age, lab data, medications, orders, and past utilization.   Low:  0-14.9   Medium: 15-21.9   High: 22-29.9   Extreme: 30 and above           Subjective: Yesterday was a better day - she was alert.  Today, she is more somnolent.  Will awake to answer limited questions and to be fed.  Family reports progressive decrease in mobility and ability to feed herself.   They are unable to continue to care for her at home.   Objective: Vitals:   04/07/23 1547 04/07/23 1702  BP:  (!) 144/52  Pulse: 70 71  Resp:  16  Temp:  98.1 F (36.7 C)  SpO2:  99%    Intake/Output Summary (Last 24 hours) at 04/07/2023 1820 Last data filed at 04/07/2023 0500 Gross per 24 hour  Intake 100 ml  Output 1000 ml  Net -900 ml   Filed Weights   04/05/23 1500  Weight: 68 kg    Exam:  General:  Slumped in chair, minimally interactive but will answer questions when directly asked Eyes:  normal lids, iris ENT:  grossly normal hearing, lips & tongue, mmm Cardiovascular:  RRR, no m/r/g. No LE edema.  Respiratory:   CTA bilaterally with no wheezes/rales/rhonchi.  Normal respiratory effort. Abdomen:  soft, NT, ND Skin:  no rash or induration seen on limited exam Musculoskeletal:  RUE with weakness but able to use both arms and legs Psychiatric:  somnolent mood and affect, speech  sparse, AOx2 Neurologic:  difficult to assess given somnolence  Data Reviewed: I have reviewed the patient's lab results since admission.  Pertinent labs for today include:   CO2 21 Glucose 177 BUN 40/Creatinine 1.36/GFR 41; 43/1.07/55 on 2/10 WBC 7 Hgb 10.8 UA: moderate Hgb, large LE, >300 protein, many bacteria Urine culture: GNR, C&S pending     Family Communication: Husband and daughter were present  throughout  Disposition: Status is: Observation The patient remains OBS appropriate and will d/c before 2 midnights.     Time spent: 50 minutes  Unresulted Labs (From admission, onward)     Start     Ordered   04/08/23 0500  CBC with Differential/Platelet  Tomorrow morning,   R        04/07/23 1816   04/08/23 0500  Basic metabolic panel  Tomorrow morning,   R        04/07/23 1816             Author: Jonah Blue, MD 04/07/2023 6:20 PM  For on call review [email or web address].

## 2023-04-07 NOTE — Evaluation (Signed)
 Physical Therapy Evaluation Patient Details Name: Brittney Ray MRN: [id number] DOB: July 08, 1950 Today's Date: 04/07/2023  History of Present Illness  Brittney Ray is a 72yoF presented to the ED with progressing weakness and inability to meet care needs by her husband.  PMH dementia, progressive supranuclear palsy, HTN, HLD, DM, CAD, TIA, hypothyroidism, depression with anxiety, CKD-3A, Paget bone disease, breast cancer (s/p of right lumpectomy, radiation therapy), GIB, ABLA.  Clinical Impression  Pt asleep on entry, awakens with stimulation, but remains less interactive than previous day. Pt requires heavy physical assistance to get to EOB and to recliner from bed, too weak to safely attempt and meaningful AMB. Anticipate return to home being unsuccessful at present due to high physical needs, may benefit from a period of short term rehab services first.       If plan is discharge home, recommend the following: Two people to help with walking and/or transfers;Assist for transportation;Assistance with cooking/housework;Help with stairs or ramp for entrance;A little help with bathing/dressing/bathroom   Can travel by private vehicle   No    Equipment Recommendations None recommended by PT  Recommendations for Other Services       Functional Status Assessment Patient has had a recent decline in their functional status and demonstrates the ability to make significant improvements in function in a reasonable and predictable amount of time.     Precautions / Restrictions Precautions Precautions: Fall Recall of Precautions/Restrictions: Impaired Restrictions Weight Bearing Restrictions Per Provider Order: No      Mobility  Bed Mobility Overal bed mobility: Needs Assistance Bed Mobility: Supine to Sit     Supine to sit: Max assist     General bed mobility comments: motivated but weak    Transfers Overall transfer level: Needs assistance Equipment used: 1 person  hand held assist Transfers: Sit to/from Stand Sit to Stand: Mod assist           General transfer comment: follows commands well for setup, cues given for stepping, but she is in a hurry to sit.    Ambulation/Gait Ambulation/Gait assistance:  (deferred, remains very weakn unsteady on feet, and now somnolent)                Careers information officer     Tilt Bed    Modified Rankin (Stroke Patients Only)       Balance                                             Pertinent Vitals/Pain Pain Assessment Pain Assessment: No/denies pain    Home Living Family/patient expects to be discharged to:: Private residence Living Arrangements: Spouse/significant other Available Help at Discharge: Family;Available 24 hours/day;Personal care attendant Type of Home: House Home Access: Stairs to enter Entrance Stairs-Rails: None Entrance Stairs-Number of Steps: 2-3   Home Layout: One level Home Equipment: Agricultural consultant (2 wheels);Shower seat;Hospital bed Additional Comments: Sister, spouse, and PCA provide supervision for patient    Prior Function Prior Level of Function : History of Falls (last six months)             Mobility Comments: HH ambulation with a RW with close SBA, w/Ray for community access, 6 falls in the last 6 months secondary to LOB ADLs Comments: Assist for all ADLs     Extremity/Trunk  Assessment                Communication        Cognition Arousal: Alert Behavior During Therapy: Flat affect   PT - Cognitive impairments: History of cognitive impairments                       PT - Cognition Comments: less alert, interactive this morning; often does not respond.         Cueing       General Comments      Exercises     Assessment/Plan    PT Assessment Patient needs continued PT services  PT Problem List Decreased strength;Decreased range of motion;Decreased activity  tolerance;Decreased balance;Decreased mobility;Decreased cognition;Decreased knowledge of use of DME;Decreased safety awareness       PT Treatment Interventions DME instruction;Gait training;Therapeutic activities;Therapeutic exercise;Balance training;Functional mobility training;Patient/family education;Neuromuscular re-education    PT Goals (Current goals can be found in the Care Plan section)  Acute Rehab PT Goals PT Goal Formulation: Patient unable to participate in goal setting    Frequency Min 2X/week     Co-evaluation               AM-PAC PT "6 Clicks" Mobility  Outcome Measure Help needed turning from your back to your side while in a flat bed without using bedrails?: Total Help needed moving from lying on your back to sitting on the side of a flat bed without using bedrails?: Total Help needed moving to and from a bed to a chair (including a wheelchair)?: A Lot Help needed standing up from a chair using your arms (e.g., wheelchair or bedside chair)?: A Lot Help needed to walk in hospital room?: A Lot Help needed climbing 3-5 steps with a railing? : Total 6 Click Score: 9    End of Session   Activity Tolerance: Patient tolerated treatment well;Patient limited by fatigue;Patient limited by lethargy Patient left: in chair;with family/visitor present;with call bell/phone within reach Nurse Communication: Mobility status PT Visit Diagnosis: Difficulty in walking, not elsewhere classified (R26.2);Unsteadiness on feet (R26.81);Other abnormalities of gait and mobility (R26.89);Muscle weakness (generalized) (M62.81)    Time: 1610-9604 PT Time Calculation (min) (ACUTE ONLY): 20 min   Charges:   PT Evaluation $PT Eval Moderate Complexity: 1 Mod   PT General Charges $$ ACUTE PT VISIT: 1 Visit       3:50 PM, 04/07/23 Rosamaria Lints, PT, DPT Physical Therapist - [facility]  [phone] (ASCOM)    Brittney Ray 04/07/2023,  3:48 PM

## 2023-04-08 DIAGNOSIS — Z7189 Other specified counseling: Secondary | ICD-10-CM | POA: Diagnosis not present

## 2023-04-08 DIAGNOSIS — R531 Weakness: Secondary | ICD-10-CM | POA: Diagnosis not present

## 2023-04-08 LAB — CBC WITH DIFFERENTIAL/PLATELET
Abs Immature Granulocytes: 0.01 10*3/uL (ref 0.00–0.07)
Basophils Absolute: 0 10*3/uL (ref 0.0–0.1)
Basophils Relative: 0 %
Eosinophils Absolute: 0.2 10*3/uL (ref 0.0–0.5)
Eosinophils Relative: 5 %
HCT: 30.2 % — ABNORMAL LOW (ref 36.0–46.0)
Hemoglobin: 9.9 g/dL — ABNORMAL LOW (ref 12.0–15.0)
Immature Granulocytes: 0 %
Lymphocytes Relative: 38 %
Lymphs Abs: 1.8 10*3/uL (ref 0.7–4.0)
MCH: 29.3 pg (ref 26.0–34.0)
MCHC: 32.8 g/dL (ref 30.0–36.0)
MCV: 89.3 fL (ref 80.0–100.0)
Monocytes Absolute: 0.5 10*3/uL (ref 0.1–1.0)
Monocytes Relative: 10 %
Neutro Abs: 2.2 10*3/uL (ref 1.7–7.7)
Neutrophils Relative %: 47 %
Platelets: 152 10*3/uL (ref 150–400)
RBC: 3.38 MIL/uL — ABNORMAL LOW (ref 3.87–5.11)
RDW: 13.2 % (ref 11.5–15.5)
WBC: 4.7 10*3/uL (ref 4.0–10.5)
nRBC: 0 % (ref 0.0–0.2)

## 2023-04-08 LAB — BASIC METABOLIC PANEL WITH GFR
Anion gap: 9 (ref 5–15)
BUN: 32 mg/dL — ABNORMAL HIGH (ref 8–23)
CO2: 25 mmol/L (ref 22–32)
Calcium: 9.2 mg/dL (ref 8.9–10.3)
Chloride: 104 mmol/L (ref 98–111)
Creatinine, Ser: 1.33 mg/dL — ABNORMAL HIGH (ref 0.44–1.00)
GFR, Estimated: 42 mL/min — ABNORMAL LOW (ref >60)
Glucose, Bld: 132 mg/dL — ABNORMAL HIGH (ref 70–99)
Potassium: 4.3 mmol/L (ref 3.5–5.1)
Sodium: 138 mmol/L (ref 135–145)

## 2023-04-08 LAB — URINE CULTURE: Culture: >=100000 — AB

## 2023-04-08 MED ORDER — CEPHALEXIN 500 MG PO CAPS
500.0000 mg | ORAL_CAPSULE | Freq: Two times a day (BID) | ORAL | Status: DC
  Administered 2023-04-08 – 2023-04-09 (×2): 500 mg via ORAL
  Filled 2023-04-08 (×2): qty 1

## 2023-04-08 NOTE — Progress Notes (Signed)
 Progress Note   Patient: Brittney Ray ZOX:[id number] DOB: 1950/06/19 DOA: 04/05/2023     1 DOS: the patient was seen and examined on 04/08/2023   Brief hospital course: 72yo with h/o anxiety, DM, HTN, hypothyroidism, and supranuclear palsy who presented on 3/24 with generalized weakness, failure to thrive.  She was diagnosed with sepsis from UTI and started on Ceftriaxone.  In discussion with family, this appears to be subacute decompensation and family acknowledges need for placement.  They have been involved with palliative care but are open to hospice.  Assessment and Plan:  Generalized weakness/progressive ambulatory dysfunction Possibly secondary to UTI However, family acknowledges a progressive pattern which may be suggestive of terminal dementia due to PSP PT/OT consulted She appears likely to need placement in SNF, +/- hospice Family is open to palliative care assistance, consult requested   E coli UTI Sepsis ruled out Continue ceftriaxone -> cephalexin   Hypothyroidism Continue Synthroid    Essential hypertension Continue amlodipine, Zestoretic   GERD without esophagitis Continue PPI   Type 2 diabetes mellitus without complications Last A1c (remote) was 5 Hold metformin Based on overall prognosis, it is unlikely that she will benefit from tight glycemic control and it may be reasonable to stop accuchecks   Stage 3 CKD Baseline varies between 3a and 3b Appears to be at/next baseline at this time Recheck BMP in AM   Dyslipidemia Continue fenofibrate, atorvastatin   PSP (progressive supranuclear palsy)  Continue Sinemet   Anxiety Continue buspirone and escitalopram           Consultants: PT OT TOC team   Procedures: None   Antibiotics: Ceftriaxone 3/24-27 Cephalexin 3/27-31  30 Day Unplanned Readmission Risk Score    Flowsheet Row ED to Hosp-Admission (Current) from 04/05/2023 in [facility] (1A)  30 Day  Unplanned Readmission Risk Score (%) 28.07 Filed at 04/06/2023 1600       This score is the patient's risk of an unplanned readmission within 30 days of being discharged (0 -100%). The score is based on dignosis, age, lab data, medications, orders, and past utilization.   Low:  0-14.9   Medium: 15-21.9   High: 22-29.9   Extreme: 30 and above           Subjective: Slightly more awake/alert today.  Otherwise no significant changes.   Objective: Vitals:   04/08/23 0510 04/08/23 0728  BP: (!) 160/61 (!) 153/61  Pulse: 60 (!) 59  Resp: 18 16  Temp: 98.2 F (36.8 C) 98.1 F (36.7 C)  SpO2: 100% 100%    Intake/Output Summary (Last 24 hours) at 04/08/2023 1611 Last data filed at 04/08/2023 1500 Gross per 24 hour  Intake 760 ml  Output 1900 ml  Net -1140 ml   Filed Weights   04/05/23 1500  Weight: 68 kg    Exam:  General:  Slightly more awake although she was resting when not answering questions when directly asked Eyes:  normal lids, iris ENT:  grossly normal hearing, lips & tongue, mmm Cardiovascular:  RRR, no m/r/g. No LE edema.  Respiratory:   CTA bilaterally with no wheezes/rales/rhonchi.  Normal respiratory effort. Abdomen:  soft, NT, ND Skin:  no rash or induration seen on limited exam Musculoskeletal:  strength symmetric today Psychiatric:  blunted mood and affect, speech sparse, AOx2 Neurologic:  no gross abnormalities  Data Reviewed: I have reviewed the patient's lab results since admission.  Pertinent labs for today include:   Glucose 132 BUN 32/Creatinine 1.33/GFR  42 Hgb 9.9, 9.2 on 2/10 Urine culture with pansensitive E coli    Family Communication: Daughter was present throughout evaluation  Disposition: Status is: Observation The patient remains OBS appropriate and will d/c before 2 midnights.     Time spent: 50 minutes  Unresulted Labs (From admission, onward)     Start     Ordered   04/09/23 0500  CBC with Differential/Platelet   Tomorrow morning,   R        04/08/23 1611   04/09/23 0500  Basic metabolic panel with GFR  Tomorrow morning,   R        04/08/23 1611             Author: Jonah Blue, MD 04/08/2023 4:11 PM  For on call review [email or web address].

## 2023-04-08 NOTE — Discharge Instructions (Signed)
 These agencies are NOT affiliated with [facility] in any way, These are all Private Pay, not covered by [employer].  The cost will vary depending on level of care needed.  This list is used as a Sports administrator and not a recommendation.  PCS (Personal Care Service) service and placement assistance agency    [facility],  Candie Chroman  [phone] Does not require a Contract or Min number of hours  [facility] [email or web address] [phone] Does not have a Minimum number of hours required  [facility] (Personal Care Service) service and placement assistance agency [facility] , Valeda Malm,  [phone] Does have a Minimum number of hours required  [facility] (Personal Care Service) [facility], Mellody Drown [phone] Does have a Minimum number of hours required

## 2023-04-08 NOTE — TOC Progression Note (Signed)
 Transition of Care [facility]) - Progression Note    Patient Details  Name: Brittney Ray MRN: [id number] Date of Birth: [birth year]  Transition of [facility]) CM/SW Contact  Marlowe Sax, RN Phone Number: 04/08/2023, 11:51 AM  Clinical Narrative:     Met with the patient and her sister in the room, She lives at home with her spouse who is her caregiver, her sister comes a few days a week and stays to help a few hours each day, she has a paid caregiver that helps with bathing, she has been to STR a few times and her sister stated that she doe snot feel it will benefit any longer, they are awaiting palliative to discuss goals of care and Hospice, I explained how insurance does not cover long term care unless she has long term insurance or medicaid, she stated that they do not qualify for medicaid and does not have long term care insurance, I explained if long term care in a facility is needed then it would be out of pocket, We also discussed the difference in cost to go home with caregivers verses a facility, she requested references, I added the PCS list in the AVS, they are waiting to talk to palliative to make a choice of next steps  Expected Discharge Plan: Home w Hospice Care Barriers to Discharge: Continued Medical Work up  Expected Discharge Plan and Services   Discharge Planning Services: CM Consult   Living arrangements for the past 2 months: Single Family Home                 DME Arranged: N/A DME Agency: NA       HH Arranged: NA           Social Determinants of Health (SDOH) Interventions SDOH Screenings   Food Insecurity: No Food Insecurity (04/06/2023)  Housing: Low Risk  (04/06/2023)  Transportation Needs: No Transportation Needs (04/06/2023)  Utilities: Not At Risk (04/06/2023)  Social Connections: Moderately Isolated (04/06/2023)  Tobacco Use: Low Risk  (04/05/2023)    Readmission Risk Interventions     No data to display

## 2023-04-08 NOTE — Consult Note (Signed)
 Consultation Note Date: 04/08/2023   Patient Name: Brittney Ray  DOB: Feb 15, 1950  MRN: [id number]  Age / Sex: 73 y.o., female  PCP: Marisue Ivan, MD Referring Physician: Jonah Blue, MD  Reason for Consultation: Establishing goals of care  HPI/Patient Profile: PER EMR "Brittney Ray is a 73 y.o. female with medical history significant for osteoarthritis, anxiety, type 2 diabetes mellitus, hypertension, hypothyroidism and supranuclear palsy, who presented to the emergency room with acute onset of generalized weakness and difficulty to be taking care of of by her husband who has been caring for her at home.  The patient has been having a progressive decline over the last week.  She has not been eating or drinking as much over the last few days.  No nausea or vomiting or diarrhea.  No chest pain or palpitations.  She admits to urinary frequency and urgency without dysuria or hematuria or flank pain.  No fever or chills.  No cough or wheezing."    Clinical Assessment and Goals of Care: Case discussed with attending.  Notes and labs reviewed in detail.  Notes reviewed with plans for possible next steps.  In to see patient.  She is currently sitting in bed at this time.  She denies complaint.  She is able to tell me her name.  She tells me that her birthday is September 07, 2001.  She states today's date is September 07, 2001.  She is unable to answer further questions.   Spoke with TOC.  Discussed that patient is married and husband is her primary caregiver.  Patient's sister helps a few days per week and there is a paid caregiver.  They are currently discussing long-term care versus increasing assistance at home.  TOC advises that patient's husband is surrogate decision maker, but is unavailable today as he is at the [place] for his own appointments.  Will reattempt tomorrow.  SUMMARY OF RECOMMENDATIONS    PMT will follow-up tomorrow when husband is available.     Primary Diagnoses: Present on Admission:  E. coli UTI (urinary tract infection)  Essential hypertension  Anxiety  PSP (progressive supranuclear palsy) (HCC)  Dyslipidemia  Hypothyroidism  GERD without esophagitis   I have reviewed the medical record, interviewed the patient and family, and examined the patient. The following aspects are pertinent.  Past Medical History:  Diagnosis Date   Anxiety    panic attacks; no curren med.   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 > 10 yrs.   Hypothyroidism    Medial meniscus tear 04/2011   right knee   Nocturia    3-4 x/night   Paget's bone disease    hips   Personal history of radiation therapy  2014   right BREAST CA   PONV (postoperative nausea and vomiting)    Social History   Socioeconomic History   Marital status: Married    Spouse name: Not on file   Number of children: Not on file   Years of education: Not on file   Highest education level: Not on file  Occupational History   Not on file  Tobacco Use   Smoking status: Never   Smokeless tobacco: Never  Vaping Use   Vaping status: Never Used  Substance and Sexual Activity   Alcohol use: No   Drug use: Never   Sexual activity: Never  Other Topics Concern   Not on file  Social History Narrative   Not on file   Social Drivers of Health   Financial Resource Strain: Not on file  Food Insecurity: No Food Insecurity (04/06/2023)   Hunger Vital Sign    Worried About Running Out of Food in the Last Year: Never true    Ran Out of Food in the Last Year: Never true  Transportation Needs: No Transportation Needs (04/06/2023)   PRAPARE - Administrator, Civil Service (Medical):  No    Lack of Transportation (Non-Medical): No  Physical Activity: Not on file  Stress: Not on file  Social Connections: Moderately Isolated (04/06/2023)   Social Connection and Isolation Panel [NHANES]    Frequency of Communication with Friends and Family: More than three times a week    Frequency of Social Gatherings with Friends and Family: More than three times a week    Attends Religious Services: Never    Database administrator or Organizations: No    Attends Engineer, structural: Never    Marital Status: Married   Family History  Problem Relation Age of Onset   Breast cancer Mother    Breast cancer Sister 13   Breast cancer Paternal Aunt    Breast cancer Paternal Grandmother    Breast cancer Cousin        maternal side 1st cousin   Scheduled Meds:  amLODipine  5 mg Oral Daily   atorvastatin  40 mg Oral QHS   busPIRone  5 mg Oral BID   calcium-vitamin D  1 tablet Oral Q breakfast   carvedilol  3.125 mg Oral BID WC   cephALEXin  500 mg Oral Q12H   enoxaparin (LOVENOX) injection  40 mg Subcutaneous Q24H   escitalopram  10 mg Oral Daily   feeding supplement  237 mL Oral BID BM   fenofibrate  160 mg Oral Daily   lisinopril  20 mg Oral Daily   And   hydrochlorothiazide  12.5 mg Oral Daily   levothyroxine  25 mcg Oral Q0600   pantoprazole  40 mg Oral BID AC   Continuous Infusions: PRN Meds:.acetaminophen **OR** acetaminophen, fentaNYL (SUBLIMAZE) injection, magnesium hydroxide, ondansetron **OR** ondansetron (ZOFRAN) IV, traZODone Medications Prior to Admission:  Prior to Admission medications   Medication Sig Start Date End Date Taking? Authorizing Provider  ACCU-CHEK AVIVA PLUS test strip USE 1 STRIP TO CHECK GLUCOSE THREE TIMES DAILY 12/03/17   [provider]  alendronate (FOSAMAX) 70 MG tablet Take 70 mg by mouth once a week. Take with a full glass of water on an empty stomach.    [provider]  amLODipine (NORVASC) 5 MG tablet Take 5 mg  by mouth daily.    [provider]  aspirin 325 MG tablet Take 325 mg by mouth daily.  [provider]  atorvastatin (LIPITOR) 40 MG tablet Take 40 mg by mouth at bedtime.    [provider]  busPIRone (BUSPAR) 5 MG tablet Take 5 mg by mouth 2 (two) times daily.    [provider]  Calcium Carb-Cholecalciferol 600-800 MG-UNIT TABS Take 1 tablet by mouth daily with breakfast.     [provider]  carvedilol (COREG) 3.125 MG tablet Take 3.125 mg by mouth 2 (two) times daily with a meal.    [provider]  escitalopram (LEXAPRO) 10 MG tablet Take 1 tablet (10 mg total) by mouth daily. 11/12/21 02/20/23  Willy Eddy, MD  feeding supplement (ENSURE ENLIVE / ENSURE PLUS) LIQD Take 237 mLs by mouth 2 (two) times daily between meals. 02/22/23   Enedina Finner, MD  fenofibrate 160 MG tablet Take 1 tablet (160 mg total) by mouth daily. 12/05/17   Salary, Jetty Duhamel D, MD  Glucose Blood (ACCU-CHEK AVIVA PLUS VI) 1 each as directed 07/25/14   [provider]  levothyroxine (SYNTHROID) 25 MCG tablet Take 25 mcg by mouth daily before breakfast.    [provider]  lisinopril-hydrochlorothiazide (ZESTORETIC) 20-12.5 MG tablet Take 1 tablet by mouth daily.    [provider]  metFORMIN (GLUCOPHAGE) 500 MG tablet Take 500 mg by mouth at bedtime.    [provider]  pantoprazole (PROTONIX) 40 MG tablet Take 1 tablet (40 mg total) by mouth 2 (two) times daily before a meal. 02/22/23   Enedina Finner, MD   Allergies  Allergen Reactions   Contrast Media [Iodinated Contrast Media] Hives and Cough   Codeine Anxiety   Morphine And Codeine Other (See Comments)    Tremors   Sulfa Antibiotics Nausea And Vomiting   Review of Systems  All other systems reviewed and are negative.   Physical Exam Pulmonary:     Effort: Pulmonary effort is normal.  Skin:    General: Skin is warm and dry.  Neurological:     Mental Status: She is  alert.     Vital Signs: BP (!) 153/61 (BP Location: Left Arm)   Pulse (!) 59   Temp 98.1 F (36.7 C)   Resp 16   Ht 5\' 1"  (1.549 m)   Wt 68 kg   SpO2 100%   BMI 28.34 kg/m  Pain Scale: 0-10   Pain Score: Asleep   SpO2: SpO2: 100 % O2 Device:SpO2: 100 % O2 Flow Rate: .   IO: Intake/output summary:  Intake/Output Summary (Last 24 hours) at 04/08/2023 1425 Last data filed at 04/08/2023 1100 Gross per 24 hour  Intake 520 ml  Output 1900 ml  Net -1380 ml    LBM: Last BM Date : 04/08/23 Baseline Weight: Weight: 68 kg Most recent weight: Weight: 68 kg       Signed by: Morton Stall, NP   Please contact Palliative Medicine Team phone at [phone] for questions and concerns.  For individual provider: See Loretha Stapler

## 2023-04-08 NOTE — Progress Notes (Signed)
 Occupational Therapy Treatment Patient Details Name: Brittney Ray MRN: [id number] DOB: [birth year] Today's Date: 04/08/2023   History of present illness Brittney Ray is a 72yoF presented to the ED with progressing weakness and inability to meet care needs by her husband.  PMH dementia, progressive supranuclear palsy, HTN, HLD, DM, CAD, TIA, hypothyroidism, depression with anxiety, CKD-3A, Paget bone disease, breast cancer (s/p of right lumpectomy, radiation therapy), GIB, ABLA.   OT comments  Brittney Ray was seen for OT treatment on this date. Upon arrival to room pt in bed, agreeable to tx. Pt requires MOD A exit bed. Utilized Corene Cornea for transfers to facilitate anterior weight shifting (prior session pt with heavy posterior lean). CGA sit<>stand x10 trials in Corene Cornea and bed>chair t/f. Once standing in chair noted to have large formed BM. MAX A Peric[facility] in standing - tolerates ~8 min static standing across x3 trials. MOD A don/doff gown in standing. Pt making good progress toward goals, will continue to follow POC. Discharge recommendation remains appropriate.        If plan is discharge home, recommend the following:  Two people to help with walking and/or transfers;Two people to help with bathing/dressing/bathroom   Equipment Recommendations  Other (comment) (defer)    Recommendations for Other Services      Precautions / Restrictions Precautions Precautions: Fall Recall of Precautions/Restrictions: Impaired Restrictions Weight Bearing Restrictions Per Provider Order: No       Mobility Bed Mobility Overal bed mobility: Needs Assistance Bed Mobility: Supine to Sit     Supine to sit: Mod assist          Transfers Overall transfer level: Needs assistance Equipment used: Ambulation equipment used Transfers: Sit to/from Stand Sit to Stand: Supervision           General transfer comment: ~10 stands in [employer] via Lift Equipment: Stedy    Balance Overall balance assessment: Needs assistance Sitting-balance support: Feet supported, Bilateral upper extremity supported Sitting balance-Leahy Scale: Fair     Standing balance support: Bilateral upper extremity supported Standing balance-Leahy Scale: Fair                             ADL either performed or assessed with clinical judgement   ADL Overall ADL's : Needs assistance/impaired                                       General ADL Comments: CGA + STEDY for simulated BSC t/f, MAX A Pericare in standing - tolerates ~2 min static standing. MOD A don/doff gown in standing     Communication Communication Communication: No apparent difficulties   Cognition Arousal: Alert Behavior During Therapy: Flat affect Cognition: History of cognitive impairments                               Following commands: Impaired        Cueing   Cueing Techniques: Verbal cues, Tactile cues  Exercises              Pertinent Vitals/ Pain       Pain Assessment Pain Assessment: No/denies pain   Frequency  Min 2X/week        Progress Toward Goals  OT Goals(current goals can now be found in the care plan section)  Progress towards OT goals: Progressing toward goals  Acute Rehab OT Goals OT Goal Formulation: With patient Time For Goal Achievement: 04/20/23 Potential to Achieve Goals: Fair ADL Goals Pt Will Perform Grooming: with set-up;with supervision;sitting Pt Will Perform Lower Body Dressing: with mod assist;sit to/from stand Pt Will Transfer to Toilet: with min assist;ambulating;bedside commode  Plan      Co-evaluation                 AM-PAC OT "6 Clicks" Daily Activity     Outcome Measure   Help from another person eating meals?: A Little Help from another person taking care of personal grooming?: A Little Help from another person toileting, which includes using toliet, bedpan, or urinal?: A Lot Help from  another person bathing (including washing, rinsing, drying)?: A Lot Help from another person to put on and taking off regular upper body clothing?: A Lot Help from another person to put on and taking off regular lower body clothing?: A Lot 6 Click Score: 14    End of Session    OT Visit Diagnosis: Other abnormalities of gait and mobility (R26.89);Muscle weakness (generalized) (M62.81)   Activity Tolerance Patient tolerated treatment well   Patient Left in chair;with call bell/phone within reach;with chair alarm set;with family/visitor present   Nurse Communication Mobility status        Time: 1610-9604 OT Time Calculation (min): 23 min  Charges: OT General Charges $OT Visit: 1 Visit OT Treatments $Self Care/Home Management : 23-37 mins  Kathie Dike, M.S. OTR/L  04/08/23, 9:41 AM  ascom [phone]

## 2023-04-09 DIAGNOSIS — Z7189 Other specified counseling: Secondary | ICD-10-CM | POA: Insufficient documentation

## 2023-04-09 DIAGNOSIS — R531 Weakness: Secondary | ICD-10-CM | POA: Diagnosis not present

## 2023-04-09 DIAGNOSIS — Z789 Other specified health status: Secondary | ICD-10-CM | POA: Diagnosis present

## 2023-04-09 LAB — BASIC METABOLIC PANEL WITH GFR
Anion gap: 7 (ref 5–15)
BUN: 39 mg/dL — ABNORMAL HIGH (ref 8–23)
CO2: 27 mmol/L (ref 22–32)
Calcium: 8.9 mg/dL (ref 8.9–10.3)
Chloride: 105 mmol/L (ref 98–111)
Creatinine, Ser: 1.58 mg/dL — ABNORMAL HIGH (ref 0.44–1.00)
GFR, Estimated: 34 mL/min — ABNORMAL LOW (ref >60)
Glucose, Bld: 127 mg/dL — ABNORMAL HIGH (ref 70–99)
Potassium: 4.8 mmol/L (ref 3.5–5.1)
Sodium: 139 mmol/L (ref 135–145)

## 2023-04-09 LAB — CBC WITH DIFFERENTIAL/PLATELET
Abs Immature Granulocytes: 0.02 10*3/uL (ref 0.00–0.07)
Basophils Absolute: 0 10*3/uL (ref 0.0–0.1)
Basophils Relative: 0 %
Eosinophils Absolute: 0.3 10*3/uL (ref 0.0–0.5)
Eosinophils Relative: 5 %
HCT: 27.5 % — ABNORMAL LOW (ref 36.0–46.0)
Hemoglobin: 9.3 g/dL — ABNORMAL LOW (ref 12.0–15.0)
Immature Granulocytes: 0 %
Lymphocytes Relative: 36 %
Lymphs Abs: 1.7 10*3/uL (ref 0.7–4.0)
MCH: 29.6 pg (ref 26.0–34.0)
MCHC: 33.8 g/dL (ref 30.0–36.0)
MCV: 87.6 fL (ref 80.0–100.0)
Monocytes Absolute: 0.4 10*3/uL (ref 0.1–1.0)
Monocytes Relative: 9 %
Neutro Abs: 2.3 10*3/uL (ref 1.7–7.7)
Neutrophils Relative %: 50 %
Platelets: 163 10*3/uL (ref 150–400)
RBC: 3.14 MIL/uL — ABNORMAL LOW (ref 3.87–5.11)
RDW: 13.5 % (ref 11.5–15.5)
WBC: 4.8 10*3/uL (ref 4.0–10.5)
nRBC: 0 % (ref 0.0–0.2)

## 2023-04-09 MED ORDER — ENOXAPARIN SODIUM 30 MG/0.3ML IJ SOSY
30.0000 mg | PREFILLED_SYRINGE | INTRAMUSCULAR | Status: DC
  Administered 2023-04-09: 30 mg via SUBCUTANEOUS
  Filled 2023-04-09: qty 0.3

## 2023-04-09 NOTE — Progress Notes (Signed)
 Progress Note   Patient: Brittney Ray:[id number] DOB: 1950/03/14 DOA: 04/05/2023     1 DOS: the patient was seen and examined on 04/09/2023   Brief hospital course: 73yo with h/o anxiety, DM, HTN, hypothyroidism, and supranuclear palsy who presented on 3/24 with generalized weakness, failure to thrive.  She was diagnosed with sepsis from UTI and started on Ceftriaxone.  In discussion with family, this appears to be subacute decompensation and family acknowledges need for placement.  They have been involved with palliative care but are open to hospice.  Assessment and Plan:  Generalized weakness/progressive ambulatory dysfunction Possibly secondary to UTI However, family acknowledges a progressive pattern which may be suggestive of terminal dementia due to PSP PT/OT consulted She appears likely to need placement in SNF, +/- hospice Family is open to palliative care -> hospice assistance, consult requested She appears to need more assistance at home, unless she will go to SNF vs. Residential hospice   E coli UTI Sepsis ruled out Continue ceftriaxone -> cephalexin   Hypothyroidism Continue Synthroid    Essential hypertension Continue amlodipine, Zestoretic   GERD without esophagitis Continue PPI   Type 2 diabetes mellitus without complications Last A1c (remote) was 5 Hold metformin Based on overall prognosis, it is unlikely that she will benefit from tight glycemic control and it may be reasonable to stop accuchecks   AKI on Stage 3 CKD Baseline varies between 3a and 3b This has been worsening since presentation, likely associated with poor PO intake Recheck BMP in AM   Dyslipidemia Continue fenofibrate, atorvastatin   PSP (progressive supranuclear palsy)  Continue Sinemet   Anxiety Continue buspirone and escitalopram           Consultants: PT OT TOC team   Procedures: None   Antibiotics: Ceftriaxone 3/24-27 Cephalexin 3/27-31     30 Day  Unplanned Readmission Risk Score    Flowsheet Row ED to Hosp-Admission (Current) from 04/05/2023 in [facility] (1A)  30 Day Unplanned Readmission Risk Score (%) 28.07 Filed at 04/06/2023 1600       This score is the patient's risk of an unplanned readmission within 30 days of being discharged (0 -100%). The score is based on dignosis, age, lab data, medications, orders, and past utilization.   Low:  0-14.9   Medium: 15-21.9   High: 22-29.9   Extreme: 30 and above           Subjective: Much more awake and alert today but with apparent significant ongoing cognitive decline.     Objective: Vitals:   04/09/23 0435 04/09/23 0810  BP: (!) 129/56 (!) 140/55  Pulse: 63 61  Resp: 18 15  Temp: 98.1 F (36.7 C) 97.6 F (36.4 C)  SpO2: 99% 98%    Intake/Output Summary (Last 24 hours) at 04/09/2023 1530 Last data filed at 04/09/2023 1422 Gross per 24 hour  Intake 1340 ml  Output 1 ml  Net 1339 ml   Filed Weights   04/05/23 1500  Weight: 68 kg    Exam:  General:  Awake, eating breakfast, lots of eggs on blankets and she is eating mostly with her fingers despite a fork being readily accessible Eyes:  normal lids, iris ENT:  grossly normal hearing, lips & tongue, mmm Cardiovascular:  RRR, no m/r/g. No LE edema.  Respiratory:   CTA bilaterally with no wheezes/rales/rhonchi.  Normal respiratory effort. Abdomen:  soft, NT, ND Skin:  no rash or induration seen on limited exam Musculoskeletal:  strength  symmetric today Psychiatric:  blunted mood and affect, speech sparse, AOx2 Neurologic:  no gross abnormalities  Data Reviewed: I have reviewed the patient's lab results since admission.  Pertinent labs for today include:   Glucose 127 BUN 39/Creatinine 1.58/GFR 34, 43/1.07/55 on 2/10 and 39/1.12/52 on presentation     Family Communication: None present  Disposition: Status is: Observation The patient remains OBS appropriate and will d/c  before 2 midnights.     Time spent: 35 minutes  Unresulted Labs (From admission, onward)     Start     Ordered   04/10/23 0500  CBC with Differential/Platelet  Tomorrow morning,   R        04/09/23 1530   04/10/23 0500  Basic metabolic panel with GFR  Tomorrow morning,   R        04/09/23 1530             Author: Jonah Blue, MD 04/09/2023 3:30 PM  For on call review [email or web address].

## 2023-04-09 NOTE — TOC Progression Note (Addendum)
 Transition of Care [facility]) - Progression Note    Patient Details  Name: Brittney Ray MRN: [id number] Date of Birth: [birth year]  [facility] CM/SW Contact  Liliana Cline, LCSW Phone Number: 04/09/2023, 4:15 PM  Clinical Narrative:    Family has chosen hospice and prefers [employer]. Ree Kida with [facility] to follow up.  4:45- Updated by Marcell Anger Representative that patient currently does not qualify for Hospice Home.    Expected Discharge Plan: Home w Hospice Care Barriers to Discharge: Continued Medical Work up  Expected Discharge Plan and Services   Discharge Planning Services: CM Consult   Living arrangements for the past 2 months: Single Family Home                 DME Arranged: N/A DME Agency: NA       HH Arranged: NA           Social Determinants of Health (SDOH) Interventions SDOH Screenings   Food Insecurity: No Food Insecurity (04/06/2023)  Housing: Low Risk  (04/06/2023)  Transportation Needs: No Transportation Needs (04/06/2023)  Utilities: Not At Risk (04/06/2023)  Social Connections: Moderately Isolated (04/06/2023)  Tobacco Use: Low Risk  (04/05/2023)    Readmission Risk Interventions     No data to display

## 2023-04-09 NOTE — Progress Notes (Signed)
 [facility] Liaison Note  Received a referral from [facility], Meagan Hagwood, LCSW, to discuss Hospice services with spouse. Reviewed with spouse hospice services as they relate to hospice at home, LTC, and the [facility].  Informed him that patient was not IPU appropriate at this time, but we could keep up with her condition for any changes.  He does not think that he can manage patient's care at home at this time.  He is also not sure he can afford LTC with Hospice.  He asked that [facility] speak with him about SNF rehab with palliative vs LTC with Hospice.  TOC and Hospital team notified.    Please call with any Hospice related questions or concerns.    Thank you for the opportunity to participate in this patient's care.  [facility] Liaison [phone]

## 2023-04-09 NOTE — Progress Notes (Addendum)
 Daily Progress Note   Patient Name: Brittney Ray       Date: 04/09/2023 DOB: 07-25-1950  Age: 73 y.o. MRN#: [id number] Attending Physician: Jonah Blue, MD Primary Care Physician: Marisue Ivan, MD Admit Date: 04/05/2023  Reason for Consultation/Follow-up: Establishing goals of care  Subjective: Notes and labs reviewed.  Into see patient.  She is confused but denies complaint.  Her husband is at bedside.  Husband stepped down the hall to talk to me as patient is in a semiprivate room.  Husband discusses that patient had 2 children a son and daughter from a previous marriage.  He states patient's daughter died.  Husband states he is patient's Runner, broadcasting/film/video.  He discusses her health and that he has been able to manage her care at home.  He states the patient's sister comes a few times a week to help, and they have paid caregivers that come to assist.  He states there are still times where she must be left alone for him to run errands.  He worries that she could fall while he is gone home.  He also worries that if something were to happen she would not be able to get out of the home herself.  He states typically she is able to feed herself and is able to assist him.  He discusses her weakness and frailty that has progressed.  He discusses her confusion.  He discusses that she has frequent UTIs, and other illnesses.  He discusses all of the options that he has looked into in regards to placement as he understands he will not be able to take care of her very much longer.  He discusses the pros and cons of each.   He shares that he has stage IV lung cancer and over a period of 7 years has had operative intervention, chemo/radiation/Biologics, and had has just resumed  chemotherapy at the [place].  We discussed her diagnosis, prognosis, GOC, EOL wishes disposition and options.  Created space and opportunity for patient  to explore thoughts and feelings regarding current medical information.   A detailed discussion was had today regarding advanced directives.  Concepts specific to code status, artifical feeding and hydration, IV antibiotics and rehospitalization were discussed.  The difference between an aggressive medical intervention path and a comfort care path was discussed.  Values and goals of care important to patient and family were attempted to be elicited.  Discussed limitations of medical interventions to prolong quality of life in some situations and discussed the concept of human mortality.  He discusses her supranuclear palsy.  He states he understands that this is progressive and is concerned for quality of life.  With conversation, he states he would like to shift to comfort care.  He states he does not want her to suffer.  He states he would like to stop all life-prolonging care and focus on comfort and dignity for what time she has left.  Specifically we addressed antibiotics, and he states he no longer wants to use antibiotic therapy for his wife.  I completed a MOST form today with husband and the signed original was placed in the chart. Each section of options on the form were reviewed in full detail and any questions were answered as needed. The form was scanned and sent to medical records for it to be uploaded under ACP tab in Epic. A photocopy was also placed in the chart to be scanned into EMR. The patient outlined their wishes for the following treatment decisions:  Cardiopulmonary Resuscitation: Do Not Attempt Resuscitation (DNR/No CPR)  Medical Interventions: Comfort Measures: Keep clean, warm, and dry. Use medication by any route, positioning, wound care, and other measures to relieve pain and suffering. Use oxygen, suction and manual  treatment of airway obstruction as needed for comfort. Do not transfer to the hospital unless comfort needs cannot be met in current location.  Antibiotics: No antibiotics (use other measures to relieve symptoms)  IV Fluids: No IV fluids (provide other measures to ensure comfort)  Feeding Tube: No feeding tube      Length of Stay: 1  Current Medications: Scheduled Meds:   amLODipine  5 mg Oral Daily   busPIRone  5 mg Oral BID   carvedilol  3.125 mg Oral BID WC   enoxaparin (LOVENOX) injection  30 mg Subcutaneous Q24H   escitalopram  10 mg Oral Daily   feeding supplement  237 mL Oral BID BM   lisinopril  20 mg Oral Daily   And   hydrochlorothiazide  12.5 mg Oral Daily   levothyroxine  25 mcg Oral Q0600   pantoprazole  40 mg Oral BID AC    Continuous Infusions:   PRN Meds: acetaminophen **OR** acetaminophen, fentaNYL (SUBLIMAZE) injection, magnesium hydroxide, ondansetron **OR** ondansetron (ZOFRAN) IV, traZODone  Physical Exam Pulmonary:     Effort: Pulmonary effort is normal.  Neurological:     Mental Status: She is alert.     Comments: Confused             Vital Signs: BP (!) 140/55 (BP Location: Left Arm)   Pulse 61   Temp 97.6 F (36.4 C)   Resp 15   Ht 5\' 1"  (1.549 m)   Wt 68 kg   SpO2 98%   BMI 28.34 kg/m  SpO2: SpO2: 98 % O2 Device: O2 Device: Room Air O2 Flow Rate:    Intake/output summary:  Intake/Output Summary (Last 24 hours) at 04/09/2023 1507 Last data filed at 04/09/2023 1422 Gross per 24 hour  Intake 1340 ml  Output 1 ml  Net 1339 ml   LBM: Last BM Date : 04/08/23 Baseline Weight: Weight: 68 kg Most recent weight: Weight: 68 kg   Patient Active Problem List   Diagnosis Date Noted   Sepsis (HCC) 04/06/2023   E. coli UTI (urinary tract infection)  04/05/2023   Generalized weakness 04/05/2023   Anxiety 04/05/2023   PSP (progressive supranuclear palsy) (HCC) 04/05/2023   Dyslipidemia 04/05/2023   Hypothyroidism 04/05/2023   Type 2  diabetes mellitus without complications (HCC) 04/05/2023   GERD without esophagitis 04/05/2023   Upper GI bleed 02/22/2023   Peptic ulcer disease 02/22/2023   GI bleed 02/21/2023   GI bleeding 02/19/2023   Acute blood loss anemia 02/19/2023   Acute renal failure superimposed on stage 3a chronic kidney disease (HCC) 02/19/2023   Acute pyelonephritis 11/01/2022   Type II diabetes mellitus with renal manifestations (HCC) 11/01/2022   Chronic kidney disease, stage 3a (HCC) 11/01/2022   History of progressive supranuclear palsy 11/01/2022   Fall at home, initial encounter 11/01/2022   Pressure injury of skin 08/25/2021   Syncope and collapse 08/24/2021   Normocytic anemia 08/24/2021   Chronic kidney disease (CKD), stage III (moderate) (HCC) 08/24/2021   Hypomagnesemia 08/24/2021   Depression with anxiety 08/24/2021   TIA (transient ischemic attack) 12/03/2017   Primary osteoarthritis of both hands 08/30/2017   Trochanteric bursitis of both hips 08/30/2017   DDD (degenerative disc disease), lumbar 08/30/2017   H/O Paget's disease of bone 08/30/2017   Osteopenia of neck of right femur 03/04/2017   Primary osteoarthritis of both knees 01/10/2016   Chronic pain of both knees 01/10/2016   Obesity (BMI 30-39.9) 03/25/2015   Multinodular goiter 03/04/2015   Vaccine counseling 10/24/2014   Postprocedural hypothyroidism 10/08/2014   Breast cancer in situ 08/30/2014   Coronary artery disease 08/17/2013   NSTEMI (non-ST elevated myocardial infarction) (HCC) 08/17/2013   Acquired hypothyroidism 02/13/2013   Diabetes mellitus type 2, uncomplicated (HCC) 02/13/2013   Essential hypertension 02/13/2013   Mixed hyperlipidemia 02/13/2013   Personal history of breast cancer 02/13/2013    Palliative Care Assessment & Plan   Recommendations/Plan: Husband would like to shift to comfort care.   Code Status:    Code Status Orders  (From admission, onward)           Start     Ordered    04/09/23 1505  Do not attempt resuscitation (DNR) - Comfort care  (Code Status)  Continuous       Question Answer Comment  If patient has no pulse and is not breathing Do Not Attempt Resuscitation   In Pre-Arrest Conditions (Patient Is Breathing and Has a Pulse) Provide comfort measures. Relieve any mechanical airway obstruction. Avoid transfer unless required for comfort.   Consent: Discussion documented in EHR or advanced directives reviewed      04/09/23 1504           Code Status History     Date Active Date Inactive Code Status Order ID Comments User Context   04/05/2023 2245 04/09/2023 1504 Limited: Do not attempt resuscitation (DNR) -DNR-LIMITED -Do Not Intubate/DNI  [id number]  Hannah Beat, MD ED   02/19/2023 2228 02/22/2023 2031 Limited: Do not attempt resuscitation (DNR) -DNR-LIMITED -Do Not Intubate/DNI  [id number]  Lorretta Harp, MD ED   02/19/2023 2047 02/19/2023 2228 Full Code [id number]  Lorretta Harp, MD ED   11/01/2022 2235 11/05/2022 2001 Limited: Do not attempt resuscitation (DNR) -DNR-LIMITED -Do Not Intubate/DNI  [id number]  Lorretta Harp, MD ED   11/01/2022 2159 11/01/2022 2235 Do not attempt resuscitation (DNR) - Comfort care [id number]  Lorretta Harp, MD ED   08/24/2021 0504 08/27/2021 2019 Full Code [id number]  Hugelmeyer, Alexis, DO ED   12/03/2017 2143 12/04/2017 1757 Full Code [id number]  Ihor Austin, MD Inpatient  Advance Directive Documentation    Flowsheet Row Most Recent Value  Type of Advance Directive Healthcare Power of Attorney, Living will, Out of facility DNR (pink MOST or yellow form)  Pre-existing out of facility DNR order (yellow form or pink MOST form) --  "MOST" Form in Place? --     Poor  Care plan was discussed with tending, TOC, hospice liaison via epic chat.  Thank you for allowing the Palliative Medicine Team to assist in the care of this patient.  I  Morton Stall, NP  Please contact Palliative Medicine Team phone at [phone] for questions and  concerns.

## 2023-04-09 NOTE — Progress Notes (Signed)
 PHARMACIST - PHYSICIAN COMMUNICATION  CONCERNING:  Enoxaparin (Lovenox) for DVT Prophylaxis    RECOMMENDATION: Patient was prescribed enoxaprin 40mg  q24 hours for VTE prophylaxis.   Filed Weights   04/05/23 1500  Weight: 68 kg (150 lb)    Body mass index is 28.34 kg/m.  Estimated Creatinine Clearance: 28 mL/min (A) (by C-G formula based on SCr of 1.58 mg/dL (H)).  Patient is candidate for enoxaparin 30mg  every 24 hours based on CrCl <37ml/min or Weight <45kg  DESCRIPTION: Pharmacy has adjusted enoxaparin dose per [facility] policy.  Patient is now receiving enoxaparin 30 mg every 24 hours    Merryl Hacker, PharmD Clinical Pharmacist  04/09/2023 7:32 AM

## 2023-04-10 DIAGNOSIS — R627 Adult failure to thrive: Secondary | ICD-10-CM | POA: Diagnosis not present

## 2023-04-10 LAB — CBC WITH DIFFERENTIAL/PLATELET
Abs Immature Granulocytes: 0.02 10*3/uL (ref 0.00–0.07)
Basophils Absolute: 0 10*3/uL (ref 0.0–0.1)
Basophils Relative: 0 %
Eosinophils Absolute: 0.3 10*3/uL (ref 0.0–0.5)
Eosinophils Relative: 5 %
HCT: 28.2 % — ABNORMAL LOW (ref 36.0–46.0)
Hemoglobin: 9.4 g/dL — ABNORMAL LOW (ref 12.0–15.0)
Immature Granulocytes: 0 %
Lymphocytes Relative: 38 %
Lymphs Abs: 2.1 10*3/uL (ref 0.7–4.0)
MCH: 29.2 pg (ref 26.0–34.0)
MCHC: 33.3 g/dL (ref 30.0–36.0)
MCV: 87.6 fL (ref 80.0–100.0)
Monocytes Absolute: 0.5 10*3/uL (ref 0.1–1.0)
Monocytes Relative: 10 %
Neutro Abs: 2.7 10*3/uL (ref 1.7–7.7)
Neutrophils Relative %: 47 %
Platelets: 167 10*3/uL (ref 150–400)
RBC: 3.22 MIL/uL — ABNORMAL LOW (ref 3.87–5.11)
RDW: 13.3 % (ref 11.5–15.5)
WBC: 5.6 10*3/uL (ref 4.0–10.5)
nRBC: 0 % (ref 0.0–0.2)

## 2023-04-10 LAB — BASIC METABOLIC PANEL WITH GFR
Anion gap: 7 (ref 5–15)
BUN: 49 mg/dL — ABNORMAL HIGH (ref 8–23)
CO2: 28 mmol/L (ref 22–32)
Calcium: 9.3 mg/dL (ref 8.9–10.3)
Chloride: 101 mmol/L (ref 98–111)
Creatinine, Ser: 1.55 mg/dL — ABNORMAL HIGH (ref 0.44–1.00)
GFR, Estimated: 35 mL/min — ABNORMAL LOW (ref >60)
Glucose, Bld: 132 mg/dL — ABNORMAL HIGH (ref 70–99)
Potassium: 4.9 mmol/L (ref 3.5–5.1)
Sodium: 136 mmol/L (ref 135–145)

## 2023-04-10 MED ORDER — LORAZEPAM 2 MG/ML IJ SOLN: 1.0000 mg | INTRAMUSCULAR | Status: DC | PRN

## 2023-04-10 MED ORDER — GLYCOPYRROLATE 0.2 MG/ML IJ SOLN: 0.2000 mg | INTRAMUSCULAR | Status: DC | PRN

## 2023-04-10 MED ORDER — LORAZEPAM 2 MG/ML PO CONC: 1.0000 mg | ORAL | Status: DC | PRN

## 2023-04-10 MED ORDER — LORAZEPAM 1 MG PO TABS
1.0000 mg | ORAL_TABLET | ORAL | Status: DC | PRN
  Administered 2023-04-11: 1 mg via ORAL
  Filled 2023-04-10: qty 1

## 2023-04-10 MED ORDER — HALOPERIDOL LACTATE 2 MG/ML PO CONC: 0.5000 mg | ORAL | Status: DC | PRN

## 2023-04-10 MED ORDER — HALOPERIDOL LACTATE 5 MG/ML IJ SOLN: 0.5000 mg | INTRAMUSCULAR | Status: DC | PRN

## 2023-04-10 MED ORDER — POLYVINYL ALCOHOL 1.4 % OP SOLN: 1.0000 [drp] | Freq: Four times a day (QID) | OPHTHALMIC | Status: DC | PRN

## 2023-04-10 MED ORDER — HYDROMORPHONE HCL 1 MG/ML IJ SOLN
0.5000 mg | INTRAMUSCULAR | Status: DC | PRN
  Administered 2023-04-11: 0.5 mg via INTRAVENOUS
  Filled 2023-04-10: qty 1

## 2023-04-10 MED ORDER — DIPHENHYDRAMINE HCL 50 MG/ML IJ SOLN: 12.5000 mg | INTRAMUSCULAR | Status: DC | PRN

## 2023-04-10 MED ORDER — GLYCOPYRROLATE 1 MG PO TABS: 1.0000 mg | ORAL_TABLET | ORAL | Status: DC | PRN

## 2023-04-10 MED ORDER — HALOPERIDOL 0.5 MG PO TABS: 0.5000 mg | ORAL_TABLET | ORAL | Status: DC | PRN

## 2023-04-10 MED ORDER — BIOTENE DRY MOUTH MT LIQD: 15.0000 mL | OROMUCOSAL | Status: DC | PRN

## 2023-04-10 NOTE — Progress Notes (Signed)
 [facility] Liaison Note  Follow up on new referral originally for Inpatient Hospice Unit [facility]).   Patient was not appropriate for IPU. We are able to serve patient at home, ALF or LTC with hospice. Patient remains not appropriate for IPU unless patient has a drastic change in her medical condition.  Norris Cross, RN Nurse Liaison [phone]

## 2023-04-10 NOTE — Progress Notes (Signed)
 Progress Note   Patient: Brittney Ray ZOX:[id number] DOB: December 11, 1950 DOA: 04/05/2023     1 DOS: the patient was seen and examined on 04/10/2023   Brief hospital course: 72yo with h/o anxiety, DM, HTN, hypothyroidism, and supranuclear palsy who presented on 3/24 with generalized weakness, failure to thrive.  She was diagnosed with sepsis from UTI and started on Ceftriaxone.  In discussion with family, this appears to be subacute decompensation and family acknowledges need for placement.  They have been involved with palliative care but are open to hospice.  Assessment and Plan:  Failure to thrive Possibly secondary to UTI However, family acknowledges a progressive pattern which may be suggestive of terminal dementia due to PSP PT/OT consulted She appears likely to need placement in SNF, +/- hospice Family is open to palliative care -> hospice assistance, consult requested She appears to need more assistance at home, unless she will go to SNF vs. Residential hospice (appears to not currently be a candidate for residential hospice) After palliative care consult on 3/28, they have transitioned to comfort care measures   E coli UTI Sepsis ruled out Continue ceftriaxone -> cephalexin Stopping abx due to transition to comfort measures   Hypothyroidism Stop Synthroid due to comfort care   Essential hypertension Stop amlodipine, Zestoretic, carvedilol due to comfort care   Type 2 diabetes mellitus without complications Last A1c (remote) was 5 Hold metformin No longer monitoring   AKI on Stage 3 CKD Baseline varies between 3a and 3b This has been worsening since presentation, likely associated with poor PO intake No longer monitoring with transition to comfort care   Dyslipidemia Stop fenofibrate, atorvastatin due to comfort care   PSP (progressive supranuclear palsy)  Palliative care stopped Sinemet   Anxiety Continue buspirone and escitalopram, as anxiety is  discomforting           Consultants: PT OT TOC team   Procedures: None   Antibiotics: Ceftriaxone 3/24-27 Cephalexin 3/27-31  30 Day Unplanned Readmission Risk Score    Flowsheet Row ED to Hosp-Admission (Current) from 04/05/2023 in [facility] (1A)  30 Day Unplanned Readmission Risk Score (%) 28.07 Filed at 04/06/2023 1600       This score is the patient's risk of an unplanned readmission within 30 days of being discharged (0 -100%). The score is based on dignosis, age, lab data, medications, orders, and past utilization.   Low:  0-14.9   Medium: 15-21.9   High: 22-29.9   Extreme: 30 and above           Subjective: Pleasant, wants help with eating breakfast - despite clear evidence that she has almost completely fed herself breakfast already   Objective: Vitals:   04/09/23 2010 04/10/23 0748  BP: (!) 111/45 114/83  Pulse: 77 65  Resp: 18 16  Temp: 99 F (37.2 C) 98.4 F (36.9 C)  SpO2: 97% 97%    Intake/Output Summary (Last 24 hours) at 04/10/2023 1527 Last data filed at 04/10/2023 0900 Gross per 24 hour  Intake 120 ml  Output 900 ml  Net -780 ml   Filed Weights   04/05/23 1500  Weight: 68 kg    Exam:  General:  Awake, eating breakfast and requesting that someone feed her Eyes:  normal lids, iris ENT:  grossly normal hearing, lips & tongue, mmm Cardiovascular:  RRR, no m/r/g. No LE edema.  Respiratory:   CTA bilaterally with no wheezes/rales/rhonchi.  Normal respiratory effort. Abdomen:  soft, NT, ND Skin:  no rash or induration seen on limited exam Musculoskeletal:  strength symmetric today Psychiatric:  blunted mood and affect, speech sparse, AOx2 Neurologic:  no gross abnormalities  Data Reviewed: I have reviewed the patient's lab results since admission.  Pertinent labs for today include:   Glucose 132 BUN 49/Creatinine 1.55/GFR 35, stable WBC 5.6 Hgb 9.4     Family Communication: None present;  palliative care family meeting held on 3/28  Disposition: Status is: Observation The patient remains OBS appropriate and will d/c before 2 midnights.     Time spent: 50 minutes  Unresulted Labs (From admission, onward)    None        Author: Jonah Blue, MD 04/10/2023 3:27 PM  For on call review [email or web address].

## 2023-04-10 NOTE — Plan of Care (Signed)

## 2023-04-10 NOTE — TOC Progression Note (Addendum)
 Transition of Care [facility]) - Progression Note    Patient Details  Name: Brittney Ray MRN: [id number] Date of Birth: 10/24/1950  [facility]) CM/SW Contact  Liliana Cline, LCSW Phone Number: 04/10/2023, 3:15 PM  Clinical Narrative:    Attempted call to spouse to discuss updates from [facility] and options. Left a VM requesting a return call.  4:45- Spoke with patient's husband. He would like patient to go to STR with Palliative Care following. He prefers [facility] (1st choice) or [facility] (2nd choice). He would like [facility] to follow at [facility]. He is not sure what he wants to do after STR, wants to see how she does with rehab and go from there. He states they cannot afford room and board for LTC with hospice. He would be open to home with hospice or going to hospice home, understands patient would need to be eligible for hospice home. Updated Attending MD and Diannia Ruder with [facility].  Started SNF work up. PASRR IS PENDING.   Expected Discharge Plan: Home w Hospice Care Barriers to Discharge: Continued Medical Work up  Expected Discharge Plan and Services   Discharge Planning Services: CM Consult   Living arrangements for the past 2 months: Single Family Home                 DME Arranged: N/A DME Agency: NA       HH Arranged: NA           Social Determinants of Health (SDOH) Interventions SDOH Screenings   Food Insecurity: No Food Insecurity (04/06/2023)  Housing: Low Risk  (04/06/2023)  Transportation Needs: No Transportation Needs (04/06/2023)  Utilities: Not At Risk (04/06/2023)  Social Connections: Moderately Isolated (04/06/2023)  Tobacco Use: Low Risk  (04/05/2023)    Readmission Risk Interventions     No data to display

## 2023-04-10 NOTE — NC FL2 (Signed)
 [place] MEDICAID FL2 LEVEL OF CARE FORM     IDENTIFICATION  Patient Name: Brittney Ray Birthdate: [birth year] Sex: female Admission Date (Current Location): 04/05/2023  [place] and [place] Number:  Chiropodist and Address:  [facility], [address], [place], [place] [address]      Provider Number: [id number]  Attending Physician Name and Address:  Jonah Blue, MD  Relative Name and Phone Number:  Eletha, Culbertson (Spouse)  [phone] (Home Phone)    Current Level of Care: Hospital Recommended Level of Care: Skilled Nursing Facility Prior Approval Number:    Date Approved/Denied:   PASRR Number:    Discharge Plan:      Current Diagnoses: Patient Active Problem List   Diagnosis Date Noted   Failure to thrive in adult 04/10/2023   Medical orders for scope of treatment form in chart 04/09/2023   Goals of care, counseling/discussion 04/09/2023   Sepsis (HCC) 04/06/2023   E. coli UTI (urinary tract infection) 04/05/2023   Generalized weakness 04/05/2023   Anxiety 04/05/2023   PSP (progressive supranuclear palsy) (HCC) 04/05/2023   Dyslipidemia 04/05/2023   Hypothyroidism 04/05/2023   Type 2 diabetes mellitus without complications (HCC) 04/05/2023   GERD without esophagitis 04/05/2023   Upper GI bleed 02/22/2023   Peptic ulcer disease 02/22/2023   GI bleed 02/21/2023   GI bleeding 02/19/2023   Acute blood loss anemia 02/19/2023   Acute renal failure superimposed on stage 3a chronic kidney disease (HCC) 02/19/2023   Acute pyelonephritis 11/01/2022   Type II diabetes mellitus with renal manifestations (HCC) 11/01/2022   Chronic kidney disease, stage 3a (HCC) 11/01/2022   History of progressive supranuclear palsy 11/01/2022   Fall at home, initial encounter 11/01/2022   Pressure injury of skin 08/25/2021   Syncope and collapse 08/24/2021   Normocytic anemia 08/24/2021   Chronic kidney disease (CKD), stage III  (moderate) (HCC) 08/24/2021   Hypomagnesemia 08/24/2021   Depression with anxiety 08/24/2021   TIA (transient ischemic attack) 12/03/2017   Primary osteoarthritis of both hands 08/30/2017   Trochanteric bursitis of both hips 08/30/2017   DDD (degenerative disc disease), lumbar 08/30/2017   H/O Paget's disease of bone 08/30/2017   Osteopenia of neck of right femur 03/04/2017   Primary osteoarthritis of both knees 01/10/2016   Chronic pain of both knees 01/10/2016   Obesity (BMI 30-39.9) 03/25/2015   Multinodular goiter 03/04/2015   Vaccine counseling 10/24/2014   Postprocedural hypothyroidism 10/08/2014   Breast cancer in situ 08/30/2014   Coronary artery disease 08/17/2013   NSTEMI (non-ST elevated myocardial infarction) (HCC) 08/17/2013   Acquired hypothyroidism 02/13/2013   Diabetes mellitus type 2, uncomplicated (HCC) 02/13/2013   Essential hypertension 02/13/2013   Mixed hyperlipidemia 02/13/2013   Personal history of breast cancer 02/13/2013    Orientation RESPIRATION BLADDER Height & Weight     Place, Self  Normal Incontinent Weight: 150 lb (68 kg) Height:  5\' 1"  (154.9 cm)  BEHAVIORAL SYMPTOMS/MOOD NEUROLOGICAL BOWEL NUTRITION STATUS      Incontinent Diet (reg)  AMBULATORY STATUS COMMUNICATION OF NEEDS Skin   Extensive Assist Verbally Bruising                       Personal Care Assistance Level of Assistance  Bathing, Feeding, Dressing Bathing Assistance: Maximum assistance Feeding assistance: Maximum assistance Dressing Assistance: Maximum assistance     Functional Limitations Info             SPECIAL CARE FACTORS FREQUENCY  PT (By licensed PT), OT (By licensed OT)     PT Frequency: 5 times per week OT Frequency: 5 times per week            Contractures      Additional Factors Info  Code Status, Allergies Code Status Info: Do not attempt resuscitation (DNR) - Comfort care Allergies Info: Contrast Media (Iodinated Contrast Media), Codeine,  Morphine And Codeine, Sulfa Antibiotics           Current Medications (04/10/2023):  This is the current hospital active medication list Current Facility-Administered Medications  Medication Dose Route Frequency Provider Last Rate Last Admin   acetaminophen (TYLENOL) tablet 650 mg  650 mg Oral Q6H PRN Mansy, Jan A, MD   650 mg at 04/06/23 1959   Or   acetaminophen (TYLENOL) suppository 650 mg  650 mg Rectal Q6H PRN Mansy, Jan A, MD       antiseptic oral rinse (BIOTENE) solution 15 mL  15 mL Topical PRN Jonah Blue, MD       busPIRone (BUSPAR) tablet 5 mg  5 mg Oral BID Mansy, Jan A, MD   5 mg at 04/10/23 1610   diphenhydrAMINE (BENADRYL) injection 12.5 mg  12.5 mg Intravenous Q4H PRN Jonah Blue, MD       escitalopram (LEXAPRO) tablet 10 mg  10 mg Oral Daily Mansy, Jan A, MD   10 mg at 04/10/23 9604   glycopyrrolate (ROBINUL) tablet 1 mg  1 mg Oral Q4H PRN Jonah Blue, MD       Or   glycopyrrolate (ROBINUL) injection 0.2 mg  0.2 mg Subcutaneous Q4H PRN Jonah Blue, MD       Or   glycopyrrolate (ROBINUL) injection 0.2 mg  0.2 mg Intravenous Q4H PRN Jonah Blue, MD       haloperidol (HALDOL) tablet 0.5 mg  0.5 mg Oral Q4H PRN Jonah Blue, MD       Or   haloperidol (HALDOL) 2 MG/ML solution 0.5 mg  0.5 mg Sublingual Q4H PRN Jonah Blue, MD       Or   haloperidol lactate (HALDOL) injection 0.5 mg  0.5 mg Intravenous Q4H PRN Jonah Blue, MD       HYDROmorphone (DILAUDID) injection 0.5-2 mg  0.5-2 mg Intravenous Q30 min PRN Jonah Blue, MD       LORazepam (ATIVAN) tablet 1 mg  1 mg Oral Q4H PRN Jonah Blue, MD       Or   LORazepam (ATIVAN) 2 MG/ML concentrated solution 1 mg  1 mg Sublingual Q4H PRN Jonah Blue, MD       Or   LORazepam (ATIVAN) injection 1 mg  1 mg Intravenous Q4H PRN Jonah Blue, MD       magnesium hydroxide (MILK OF MAGNESIA) suspension 30 mL  30 mL Oral Daily PRN Mansy, Jan A, MD       ondansetron [facility]) tablet 4 mg  4 mg  Oral Q6H PRN Mansy, Jan A, MD       Or   ondansetron [facility]) injection 4 mg  4 mg Intravenous Q6H PRN Mansy, Jan A, MD       polyvinyl alcohol (LIQUIFILM TEARS) 1.4 % ophthalmic solution 1 drop  1 drop Both Eyes QID PRN Jonah Blue, MD       traZODone (DESYREL) tablet 25 mg  25 mg Oral QHS PRN Mansy, Jan A, MD   25 mg at 04/08/23 2105     Discharge Medications: Please see discharge summary for a  list of discharge medications.  Relevant Imaging Results:  Relevant Lab Results:   Additional Information SS#: [id number]  Evelene Roussin E Charlese Gruetzmacher, LCSW

## 2023-04-11 DIAGNOSIS — Z515 Encounter for palliative care: Secondary | ICD-10-CM | POA: Diagnosis not present

## 2023-04-11 DIAGNOSIS — R531 Weakness: Secondary | ICD-10-CM | POA: Diagnosis not present

## 2023-04-11 DIAGNOSIS — N390 Urinary tract infection, site not specified: Secondary | ICD-10-CM | POA: Diagnosis not present

## 2023-04-11 DIAGNOSIS — R627 Adult failure to thrive: Secondary | ICD-10-CM | POA: Diagnosis not present

## 2023-04-11 LAB — CULTURE, BLOOD (ROUTINE X 2)
Culture: NO GROWTH
Culture: NO GROWTH

## 2023-04-11 NOTE — Progress Notes (Signed)
 Progress Note   Patient: Brittney Ray [id number]:[id number] DOB: January 25, 1950 DOA: 04/05/2023     1 DOS: the patient was seen and examined on 04/11/2023   Brief hospital course: 73yo with h/o anxiety, DM, HTN, hypothyroidism, and supranuclear palsy who presented on 3/24 with generalized weakness, failure to thrive.  She was diagnosed with sepsis from UTI and started on Ceftriaxone.  In discussion with family, this appears to be subacute decompensation and family acknowledges need for placement.  They have been involved with palliative care but are open to hospice.  Assessment and Plan:  Failure to thrive Possibly secondary to UTI However, family acknowledges a progressive pattern which may be suggestive of terminal dementia due to PSP PT/OT consulted She appears likely to need placement in SNF, +/- hospice Family is open to palliative care -> hospice assistance, consult requested She appears to need more assistance at home, unless she will go to SNF vs. Residential hospice (appears to not currently be a candidate for residential hospice) After palliative care consult on 3/28, they have transitioned to comfort care measures Plan for dc to home tomorrow with husband and in-home hospice services   E coli UTI Sepsis ruled out Continue ceftriaxone -> cephalexin Stopping abx due to transition to comfort measures   Hypothyroidism Stop Synthroid due to comfort care   Essential hypertension Stop amlodipine, Zestoretic, carvedilol due to comfort care   Type 2 diabetes mellitus without complications Last A1c (remote) was 5 Hold metformin No longer monitoring   AKI on Stage 3 CKD Baseline varies between 3a and 3b This has been worsening since presentation, likely associated with poor PO intake No longer monitoring with transition to comfort care   Dyslipidemia Stop fenofibrate, atorvastatin due to comfort care   PSP (progressive supranuclear palsy)  Palliative care stopped Sinemet    Anxiety Continue buspirone and escitalopram, as anxiety is discomforting           Consultants: PT OT TOC team   Procedures: None   Antibiotics: Ceftriaxone 3/24-27 Cephalexin 3/27-31  30 Day Unplanned Readmission Risk Score    Flowsheet Row ED to Hosp-Admission (Current) from 04/05/2023 in [facility] (1A)  30 Day Unplanned Readmission Risk Score (%) 28.07 Filed at 04/06/2023 1600       This score is the patient's risk of an unplanned readmission within 30 days of being discharged (0 -100%). The score is based on dignosis, age, lab data, medications, orders, and past utilization.   Low:  0-14.9   Medium: 15-21.9   High: 22-29.9   Extreme: 30 and above           Subjective: Sister present at bedside.  Patient is unchanged - minimally conversant, but continues to eat very well.   Objective: Vitals:   04/10/23 0748 04/10/23 1645  BP: 114/83 (!) 125/47  Pulse: 65 67  Resp: 16 16  SpO2: 97% 99%    Intake/Output Summary (Last 24 hours) at 04/11/2023 0830 Last data filed at 04/11/2023 0748 Gross per 24 hour  Intake 120 ml  Output 1700 ml  Net -1580 ml   Filed Weights   04/05/23 1500  Weight: 68 kg    Exam:  General:  Awake, minimally conversant, says she feels "lousy" Eyes:  normal lids, iris ENT:  grossly normal hearing, lips & tongue, mmm Cardiovascular:  RRR, no m/r/g. No LE edema.  Respiratory:   CTA bilaterally with no wheezes/rales/rhonchi.  Normal respiratory effort. Abdomen:  soft, NT, ND Skin:  no  rash or induration seen on limited exam Musculoskeletal:  strength symmetric today Psychiatric:  blunted mood and affect, speech sparse, AOx2 Neurologic:  no gross abnormalities  Data Reviewed: I have reviewed the patient's lab results since admission.  Pertinent labs for today include:   None today     Family Communication: Sister was present throughout evaluation  Disposition: Status is: Observation The  patient remains OBS appropriate and will d/c before 2 midnights.     Time spent: 35 minutes  Unresulted Labs (From admission, onward)    None        Author: Jonah Blue, MD 04/11/2023 8:30 AM  For on call review [email or web address].

## 2023-04-11 NOTE — Progress Notes (Signed)
 Daily Progress Note   Patient Name: Brittney Ray       Date: 04/11/2023 DOB: [birth year]  Age: 73 y.o. MRN#: [id number] Attending Physician: Jonah Blue, MD Primary Care Physician: Marisue Ivan, MD Admit Date: 04/05/2023  Reason for Consultation/Follow-up: Establishing goals of care  HPI/Brief Hospital Review: Brittney Ray is a 73 y.o. female with medical history significant for osteoarthritis, anxiety, type 2 diabetes mellitus, hypertension, hypothyroidism and supranuclear palsy, who presented to the emergency room with acute onset of generalized weakness and difficulty to be taking care of of by her husband who has been caring for her at home.  The patient has been having a progressive decline over the last week.  She has not been eating or drinking as much over the last few days.  No nausea or vomiting or diarrhea.  No chest pain or palpitations.  She admits to urinary frequency and urgency without dysuria or hematuria or flank pain.  No fever or chills.  No cough or wheezing.   Palliative Medicine consulted for assisting with goals of care conversations.  Subjective: Extensive chart review has been completed prior to meeting patient including labs, vital signs, imaging, progress notes, orders, and available advanced directive documents from current and previous encounters.    Secure messages received from hospice liaison and primary team 3/29 evening, husband wishing to pursue STR. Called and spoke with husband, meeting set for 3/30 at 11:30.  Met with husband-David in collaboration with Renea Ee, RN [facility] hospice liaison in conference room.  Brittney Ray reviews Brittney Ray's health decline over the last several years with a drastic decline over the last several  months. He reference being on a vicious cycle of returning to the hospital and bouncing back and forth to rehab.  We reviewed comfort care and Brittney Ray confirms he wishes for comfort measures to remain.  We discussed the overall philosophy of hospice and the options of hospice at home versus hospice following at LTC facility. Brittney Ray is not appropriate for IPU at this time.  We discussed the support and education that would be provided by hospice in the home. Brittney Ray shares he cannot afford the cost of LTC. DME equipment needs were addressed. Brittney Ray shares he is agreeable to returning home with hospice and feels he will be able to meet her needs in  the home with the support of the hospice team and private care aids.  At bedside Brittney Ray lies in bed with eyes open, unable to answer orientation questions. She appears comfortable without signs of distress or discomfort. Review of MAR, low symptom burden, no recommendations for changes or modifications at this time.  Answered and addressed all questions and concerns.  Care plan was discussed with primary team and hospice liaison  Thank you for allowing the Palliative Medicine Team to assist in the care of this patient.  Total time:  50 minutes  Time spent includes: Detailed review of medical records (labs, imaging, vital signs), medically appropriate exam (mental status, respiratory, cardiac, skin), discussed with treatment team, counseling and educating patient, family and staff, documenting clinical information, medication management and coordination of care.  Leeanne Deed, DNP, AGNP-C Palliative Medicine   Please contact Palliative Medicine Team phone at [phone] for questions and concerns.

## 2023-04-11 NOTE — TOC Progression Note (Addendum)
 Transition of Care [facility]) - Progression Note    Patient Details  Name: DEDRIA ENDRES MRN: [id number] Date of Birth: November 22, 1950  [facility]) CM/SW Contact  Liliana Cline, LCSW Phone Number: 04/11/2023, 11:01 AM  Clinical Narrative:    PASRR additional info uploaded. PASRR is pending.  1:40- CSW received update - plan for home with [facility] tomorrow.    Expected Discharge Plan: Home w Hospice Care Barriers to Discharge: Continued Medical Work up  Expected Discharge Plan and Services   Discharge Planning Services: CM Consult   Living arrangements for the past 2 months: Single Family Home                 DME Arranged: N/A DME Agency: NA       HH Arranged: NA           Social Determinants of Health (SDOH) Interventions SDOH Screenings   Food Insecurity: No Food Insecurity (04/06/2023)  Housing: Low Risk  (04/06/2023)  Transportation Needs: No Transportation Needs (04/06/2023)  Utilities: Not At Risk (04/06/2023)  Social Connections: Moderately Isolated (04/06/2023)  Tobacco Use: Low Risk  (04/05/2023)    Readmission Risk Interventions     No data to display

## 2023-04-11 NOTE — Plan of Care (Signed)
  Problem: Respiratory: Goal: Ability to maintain adequate ventilation will improve Outcome: Progressing   Problem: Activity: Goal: Risk for activity intolerance will decrease Outcome: Not Progressing   Problem: Nutrition: Goal: Adequate nutrition will be maintained Outcome: Not Progressing   Problem: Coping: Goal: Level of anxiety will decrease Outcome: Not Progressing

## 2023-04-12 DIAGNOSIS — Z7189 Other specified counseling: Secondary | ICD-10-CM | POA: Diagnosis not present

## 2023-04-12 DIAGNOSIS — R627 Adult failure to thrive: Secondary | ICD-10-CM | POA: Diagnosis not present

## 2023-04-12 NOTE — Progress Notes (Signed)
 Nutrition Brief Note  Chart reviewed. Pt now transitioning to comfort care.  No further nutrition interventions planned at this time.  Please re-consult as needed.   Levada Schilling, RD, LDN, CDCES Registered Dietitian III Certified Diabetes Care and Education Specialist If unable to reach this RD, please use "RD Inpatient" group chat on secure chat between hours of 8am-4 pm daily

## 2023-04-12 NOTE — TOC Progression Note (Signed)
 Transition of Care [facility]) - Progression Note    Patient Details  Name: Brittney Ray MRN: [id number] Date of Birth: 1950/07/31  Transition of Care [facility]) CM/SW Contact  Marlowe Sax, RN Phone Number: 04/12/2023, 1:39 PM  Clinical Narrative:     The patient is unable to assist in getting out of the bed EMS called to transport home   Expected Discharge Plan: Home w Hospice Care Barriers to Discharge: Continued Medical Work up  Expected Discharge Plan and Services   Discharge Planning Services: CM Consult   Living arrangements for the past 2 months: Single Family Home Expected Discharge Date: 04/12/23               DME Arranged: N/A DME Agency: NA       HH Arranged: NA           Social Determinants of Health (SDOH) Interventions SDOH Screenings   Food Insecurity: No Food Insecurity (04/06/2023)  Housing: Low Risk  (04/06/2023)  Transportation Needs: No Transportation Needs (04/06/2023)  Utilities: Not At Risk (04/06/2023)  Social Connections: Moderately Isolated (04/06/2023)  Tobacco Use: Low Risk  (04/05/2023)    Readmission Risk Interventions     No data to display

## 2023-04-12 NOTE — TOC Progression Note (Signed)
 Transition of Care [facility]) - Progression Note    Patient Details  Name: TROI BECHTOLD MRN: [id number] Date of Birth: 06/13/1950  Transition of Care [facility]) CM/SW Contact  Marlowe Sax, RN Phone Number: 04/12/2023, 12:50 PM  Clinical Narrative:     Spoke with Husband he will transport the patient home, to go home with Hospice  Expected Discharge Plan: Home w Hospice Care Barriers to Discharge: Continued Medical Work up  Expected Discharge Plan and Services   Discharge Planning Services: CM Consult   Living arrangements for the past 2 months: Single Family Home Expected Discharge Date: 04/12/23               DME Arranged: N/A DME Agency: NA       HH Arranged: NA           Social Determinants of Health (SDOH) Interventions SDOH Screenings   Food Insecurity: No Food Insecurity (04/06/2023)  Housing: Low Risk  (04/06/2023)  Transportation Needs: No Transportation Needs (04/06/2023)  Utilities: Not At Risk (04/06/2023)  Social Connections: Moderately Isolated (04/06/2023)  Tobacco Use: Low Risk  (04/05/2023)    Readmission Risk Interventions     No data to display

## 2023-04-12 NOTE — Discharge Summary (Signed)
 Physician Discharge Summary   Patient: Brittney Ray MRN: [id number] DOB: 1950-10-07  Admit date:     04/05/2023  Discharge date: 04/12/23  Discharge Physician: Jonah Blue   PCP: Marisue Ivan, MD   Recommendations at discharge:   You are being discharged to home with hospice Chronic home medications other than buspirone and escitalopram have been stopped Hospice will assess for the need for additional medications for comfort  Discharge Diagnoses: Principal Problem:   Failure to thrive in adult Active Problems:   E. coli UTI (urinary tract infection)   Hypothyroidism   Essential hypertension   Anxiety   PSP (progressive supranuclear palsy) (HCC)   Dyslipidemia   Type 2 diabetes mellitus without complications (HCC)   Medical orders for scope of treatment form in chart   Hospital Course: 73yo with h/o anxiety, DM, HTN, hypothyroidism, and supranuclear palsy who presented on 3/24 with generalized weakness, failure to thrive.  She was diagnosed with sepsis from UTI and started on Ceftriaxone.  In discussion with family, this appears to be subacute decompensation and family acknowledges need for placement.  They have been involved with palliative care but are open to hospice.  Assessment and Plan:  Failure to thrive Possibly secondary to UTI However, family acknowledges a progressive pattern which may be suggestive of terminal dementia due to PSP PT/OT consulted She appears likely to need placement in SNF, +/- hospice Family is open to palliative care -> hospice assistance, consult requested After palliative care consult on 3/28, they have transitioned to comfort care measures Plan for dc to home today with husband and in-home hospice services   E coli UTI Sepsis ruled out Continue ceftriaxone -> cephalexin Effectively treated   Hypothyroidism Stop Synthroid due to comfort care   Essential hypertension Stop amlodipine, Zestoretic, carvedilol due to  comfort care   Type 2 diabetes mellitus without complications Last A1c (remote) was 5 Hold metformin No longer monitoring   AKI on Stage 3 CKD Baseline varies between 3a and 3b This has been worsening since presentation, likely associated with poor PO intake No longer monitoring with transition to comfort care   Dyslipidemia Stop fenofibrate, atorvastatin due to comfort care   PSP (progressive supranuclear palsy)  Palliative care stopped Sinemet   Anxiety Continue buspirone and escitalopram, as anxiety is discomforting           Consultants: PT OT TOC team   Procedures: None   Antibiotics: Ceftriaxone 3/24-27 Cephalexin 3/27-31  Pain control - [facility] Controlled Substance Reporting System database was reviewed. and patient was instructed, not to drive, operate heavy machinery, perform activities at heights, swimming or participation in water activities or provide baby-sitting services while on Pain, Sleep and Anxiety Medications; until their outpatient Physician has advised to do so again. Also recommended to not to take more than prescribed Pain, Sleep and Anxiety Medications.    Disposition:  Home with hospice Diet recommendation:  Regular diet DISCHARGE MEDICATION: Allergies as of 04/12/2023       Reactions   Contrast Media [iodinated Contrast Media] Hives, Cough   Codeine Anxiety   Morphine And Codeine Other (See Comments)   Tremors   Sulfa Antibiotics Nausea And Vomiting        Medication List     STOP taking these medications    Accu-Chek Aviva Plus test strip Generic drug: glucose blood   ACCU-CHEK AVIVA PLUS VI   alendronate 70 MG tablet Commonly known as: FOSAMAX   amLODipine 5 MG tablet Commonly known  as: NORVASC   aspirin EC 81 MG tablet   atorvastatin 40 MG tablet Commonly known as: LIPITOR   Calcium Carb-Cholecalciferol 600-800 MG-UNIT Tabs   carvedilol 3.125 MG tablet Commonly known as: COREG   feeding supplement  Liqd   fenofibrate 160 MG tablet   levothyroxine 25 MCG tablet Commonly known as: SYNTHROID   lisinopril-hydrochlorothiazide 20-12.5 MG tablet Commonly known as: ZESTORETIC   metFORMIN 500 MG tablet Commonly known as: GLUCOPHAGE   pantoprazole 40 MG tablet Commonly known as: PROTONIX       TAKE these medications    busPIRone 5 MG tablet Commonly known as: BUSPAR Take 5 mg by mouth 2 (two) times daily.   escitalopram 10 MG tablet Commonly known as: LEXAPRO Take 1 tablet (10 mg total) by mouth daily.        Discharge Exam:   Subjective: Reports that she feels "pretty good" today - this is improved.  No concerns.   Objective: Vitals:   04/12/23 0734 04/12/23 0740  BP:  130/60  Pulse:    Resp:    Temp: 98.6 F (37 C)   SpO2:      Intake/Output Summary (Last 24 hours) at 04/12/2023 1135 Last data filed at 04/12/2023 0548 Gross per 24 hour  Intake 0 ml  Output 850 ml  Net -850 ml   Filed Weights   04/05/23 1500  Weight: 68 kg    Exam:  General:  Awake, minimally conversant Eyes:  normal lids, iris ENT:  grossly normal hearing, lips & tongue, mmm Cardiovascular:  RRR, no m/r/g. No LE edema.  Respiratory:   CTA bilaterally with no wheezes/rales/rhonchi.  Normal respiratory effort. Abdomen:  soft, NT, ND Skin:  no rash or induration seen on limited exam Musculoskeletal:  strength symmetric today Psychiatric:  blunted mood and affect, speech sparse Neurologic:  no gross abnormalities   Data Reviewed: I have reviewed the patient's lab results since admission.  Pertinent labs for today include:   None    Condition at discharge: fair  The results of significant diagnostics from this hospitalization (including imaging, microbiology, ancillary and laboratory) are listed below for reference.   Imaging Studies: No results found.  Microbiology: Results for orders placed or performed during the hospital encounter of 04/05/23  Resp panel by RT-PCR  (RSV, Flu A&B, Covid) Urine, Clean Catch     Status: None   Collection Time: 04/05/23  9:01 PM   Specimen: Urine, Clean Catch; Nasal Swab  Result Value Ref Range Status   SARS Coronavirus 2 by RT PCR NEGATIVE NEGATIVE Final    Comment: (NOTE) SARS-CoV-2 target nucleic acids are NOT DETECTED.  The SARS-CoV-2 RNA is generally detectable in upper respiratory specimens during the acute phase of infection. The lowest concentration of SARS-CoV-2 viral copies this assay can detect is 138 copies/mL. A negative result does not preclude SARS-Cov-2 infection and should not be used as the sole basis for treatment or other patient management decisions. A negative result may occur with  improper specimen collection/handling, submission of specimen other than nasopharyngeal swab, presence of viral mutation(s) within the areas targeted by this assay, and inadequate number of viral copies(<138 copies/mL). A negative result must be combined with clinical observations, patient history, and epidemiological information. The expected result is Negative.  Fact Sheet for Patients:  BloggerCourse.com  Fact Sheet for Healthcare Providers:  SeriousBroker.it  This test is no t yet approved or cleared by the [place] FDA and  has been authorized for detection and/or diagnosis  of SARS-CoV-2 by FDA under an Emergency Use Authorization (EUA). This EUA will remain  in effect (meaning this test can be used) for the duration of the COVID-19 declaration under Section 564(b)(1) of the Act, 21 U.S.C.section 360bbb-3(b)(1), unless the authorization is terminated  or revoked sooner.       Influenza A by PCR NEGATIVE NEGATIVE Final   Influenza B by PCR NEGATIVE NEGATIVE Final    Comment: (NOTE) The Xpert Xpress SARS-CoV-2/FLU/RSV plus assay is intended as an aid in the diagnosis of influenza from Nasopharyngeal swab specimens and should not be used as a sole  basis for treatment. Nasal washings and aspirates are unacceptable for Xpert Xpress SARS-CoV-2/FLU/RSV testing.  Fact Sheet for Patients: BloggerCourse.com  Fact Sheet for Healthcare Providers: SeriousBroker.it  This test is not yet approved or cleared by the [place] FDA and has been authorized for detection and/or diagnosis of SARS-CoV-2 by FDA under an Emergency Use Authorization (EUA). This EUA will remain in effect (meaning this test can be used) for the duration of the COVID-19 declaration under Section 564(b)(1) of the Act, 21 U.S.C. section 360bbb-3(b)(1), unless the authorization is terminated or revoked.     Resp Syncytial Virus by PCR NEGATIVE NEGATIVE Final    Comment: (NOTE) Fact Sheet for Patients: BloggerCourse.com  Fact Sheet for Healthcare Providers: SeriousBroker.it  This test is not yet approved or cleared by the [place] FDA and has been authorized for detection and/or diagnosis of SARS-CoV-2 by FDA under an Emergency Use Authorization (EUA). This EUA will remain in effect (meaning this test can be used) for the duration of the COVID-19 declaration under Section 564(b)(1) of the Act, 21 U.S.C. section 360bbb-3(b)(1), unless the authorization is terminated or revoked.  Performed at [facility], [address] [place], [place] [address]   Urine Culture     Status: Abnormal   Collection Time: 04/05/23  9:16 PM   Specimen: Urine, Random  Result Value Ref Range Status   Specimen Description   Final    URINE, RANDOM Performed at [facility], [address] [place], [place] [address]    Special Requests   Final    NONE Performed at [facility], [address] [place], [place] [address]    Culture >=100,000 COLONIES/mL ESCHERICHIA COLI (A)  Final   Report Status 04/08/2023 FINAL  Final   Organism ID, Bacteria  ESCHERICHIA COLI (A)  Final      Susceptibility   Escherichia coli - MIC*    AMPICILLIN <=2 SENSITIVE Sensitive     CEFAZOLIN <=4 SENSITIVE Sensitive     CEFEPIME <=0.12 SENSITIVE Sensitive     CEFTRIAXONE <=0.25 SENSITIVE Sensitive     CIPROFLOXACIN <=0.25 SENSITIVE Sensitive     GENTAMICIN <=1 SENSITIVE Sensitive     IMIPENEM <=0.25 SENSITIVE Sensitive     NITROFURANTOIN <=16 SENSITIVE Sensitive     TRIMETH/SULFA <=20 SENSITIVE Sensitive     AMPICILLIN/SULBACTAM <=2 SENSITIVE Sensitive     PIP/TAZO <=4 SENSITIVE Sensitive ug/mL    * >=100,000 COLONIES/mL ESCHERICHIA COLI  Culture, blood (Routine X 2) w Reflex to ID Panel     Status: None   Collection Time: 04/06/23  1:02 AM   Specimen: BLOOD  Result Value Ref Range Status   Specimen Description BLOOD BLOOD RIGHT ARM  Final   Special Requests   Final    BOTTLES DRAWN AEROBIC AND ANAEROBIC Blood Culture results may not be optimal due to an inadequate volume of blood received in  culture bottles   Culture   Final    NO GROWTH 5 DAYS Performed at [facility], [address] [place], [place] [address]    Report Status 04/11/2023 FINAL  Final  Culture, blood (Routine X 2) w Reflex to ID Panel     Status: None   Collection Time: 04/06/23  1:03 AM   Specimen: BLOOD  Result Value Ref Range Status   Specimen Description BLOOD BLOOD RIGHT HAND  Final   Special Requests   Final    BOTTLES DRAWN AEROBIC AND ANAEROBIC Blood Culture results may not be optimal due to an inadequate volume of blood received in culture bottles   Culture   Final    NO GROWTH 5 DAYS Performed at [facility], [address] [place], [place] [address]    Report Status 04/11/2023 FINAL  Final    Labs: CBC: Recent Labs  Lab 04/05/23 1504 04/06/23 0102 04/08/23 0409 04/09/23 0525 04/10/23 0342  WBC 9.2 7.0 4.7 4.8 5.6  NEUTROABS  --   --  2.2 2.3 2.7  HGB 13.0 10.9* 9.9* 9.3* 9.4*  HCT 38.9 32.6* 30.2* 27.5* 28.2*  MCV 87.2 87.4  89.3 87.6 87.6  PLT 240 164 152 163 167   Basic Metabolic Panel: Recent Labs  Lab 04/05/23 1504 04/06/23 0102 04/08/23 0409 04/09/23 0525 04/10/23 0342  NA 139 137 138 139 136  K 3.7 3.6 4.3 4.8 4.9  CL 109 109 104 105 101  CO2 21* 21* 25 27 28   GLUCOSE 163* 177* 132* 127* 132*  BUN 39* 40* 32* 39* 49*  CREATININE 1.12* 1.36* 1.33* 1.58* 1.55*  CALCIUM 9.1 8.3* 9.2 8.9 9.3   Liver Function Tests: No results for input(s): "AST", "ALT", "ALKPHOS", "BILITOT", "PROT", "ALBUMIN" in the last 168 hours. CBG: Recent Labs  Lab 04/05/23 2132  GLUCAP 113*    Discharge time spent: greater than 30 minutes.  Signed: Jonah Blue, MD [facility] 04/12/2023

## 2023-04-12 NOTE — Progress Notes (Signed)
 Daily Progress Note   Patient Name: Brittney Ray       Date: 04/12/2023 DOB: Dec 11, 1950  Age: 73 y.o. MRN#: [id number] Attending Physician: Jonah Blue, MD Primary Care Physician: Marisue Ivan, MD Admit Date: 04/05/2023  Reason for Consultation/Follow-up: Establishing goals of care  Subjective: Notes reviewed. In to see patient. She denies complaint at this time. TOC working on home with hospice.    Length of Stay: 0  Current Medications: Scheduled Meds:   busPIRone  5 mg Oral BID   escitalopram  10 mg Oral Daily    Continuous Infusions:   PRN Meds: acetaminophen **OR** acetaminophen, antiseptic oral rinse, diphenhydrAMINE, glycopyrrolate **OR** glycopyrrolate **OR** glycopyrrolate, haloperidol **OR** haloperidol **OR** haloperidol lactate, HYDROmorphone (DILAUDID) injection, LORazepam **OR** LORazepam **OR** LORazepam, magnesium hydroxide, ondansetron **OR** ondansetron (ZOFRAN) IV, polyvinyl alcohol, traZODone  Physical Exam Pulmonary:     Effort: Pulmonary effort is normal.  Neurological:     Mental Status: She is alert.             Vital Signs: BP 130/60 (BP Location: Right Arm)   Pulse 65   Temp 98.6 F (37 C)   Resp 18   Ht 5\' 1"  (1.549 m)   Wt 68 kg   SpO2 98%   BMI 28.34 kg/m  SpO2: SpO2: 98 % O2 Device: O2 Device: Room Air O2 Flow Rate:    Intake/output summary:  Intake/Output Summary (Last 24 hours) at 04/12/2023 1144 Last data filed at 04/12/2023 0548 Gross per 24 hour  Intake 0 ml  Output 850 ml  Net -850 ml   LBM: Last BM Date : 04/12/23 Baseline Weight: Weight: 68 kg Most recent weight: Weight: 68 kg        Patient Active Problem List   Diagnosis Date Noted   Failure to thrive in adult 04/10/2023   Medical orders for scope  of treatment form in chart 04/09/2023   Goals of care, counseling/discussion 04/09/2023   Sepsis (HCC) 04/06/2023   E. coli UTI (urinary tract infection) 04/05/2023   Generalized weakness 04/05/2023   Anxiety 04/05/2023   PSP (progressive supranuclear palsy) (HCC) 04/05/2023   Dyslipidemia 04/05/2023   Hypothyroidism 04/05/2023   Type 2 diabetes mellitus without complications (HCC) 04/05/2023   GERD without esophagitis 04/05/2023   Upper GI bleed 02/22/2023   Peptic ulcer  disease 02/22/2023   GI bleed 02/21/2023   GI bleeding 02/19/2023   Acute blood loss anemia 02/19/2023   Acute renal failure superimposed on stage 3a chronic kidney disease (HCC) 02/19/2023   Acute pyelonephritis 11/01/2022   Type II diabetes mellitus with renal manifestations (HCC) 11/01/2022   Chronic kidney disease, stage 3a (HCC) 11/01/2022   History of progressive supranuclear palsy 11/01/2022   Fall at home, initial encounter 11/01/2022   Pressure injury of skin 08/25/2021   Syncope and collapse 08/24/2021   Normocytic anemia 08/24/2021   Chronic kidney disease (CKD), stage III (moderate) (HCC) 08/24/2021   Hypomagnesemia 08/24/2021   Depression with anxiety 08/24/2021   TIA (transient ischemic attack) 12/03/2017   Primary osteoarthritis of both hands 08/30/2017   Trochanteric bursitis of both hips 08/30/2017   DDD (degenerative disc disease), lumbar 08/30/2017   H/O Paget's disease of bone 08/30/2017   Osteopenia of neck of right femur 03/04/2017   Primary osteoarthritis of both knees 01/10/2016   Chronic pain of both knees 01/10/2016   Obesity (BMI 30-39.9) 03/25/2015   Multinodular goiter 03/04/2015   Vaccine counseling 10/24/2014   Postprocedural hypothyroidism 10/08/2014   Breast cancer in situ 08/30/2014   Coronary artery disease 08/17/2013   NSTEMI (non-ST elevated myocardial infarction) (HCC) 08/17/2013   Acquired hypothyroidism 02/13/2013   Diabetes mellitus type 2, uncomplicated (HCC)  02/13/2013   Essential hypertension 02/13/2013   Mixed hyperlipidemia 02/13/2013   Personal history of breast cancer 02/13/2013    Palliative Care Assessment & Plan   Recommendations/Plan: Plans in place for home with hospice.  Code Status:    Code Status Orders  (From admission, onward)           Start     Ordered   04/10/23 1521  Do not attempt resuscitation (DNR) - Comfort care  Continuous       Question Answer Comment  If patient has no pulse and is not breathing Do Not Attempt Resuscitation   In Pre-Arrest Conditions (Patient Is Breathing and Has a Pulse) Provide comfort measures. Relieve any mechanical airway obstruction. Avoid transfer unless required for comfort.   Consent: Discussion documented in EHR or advanced directives reviewed      04/10/23 1523           Code Status History     Date Active Date Inactive Code Status Order ID Comments User Context   04/09/2023 1505 04/10/2023 1523 Do not attempt resuscitation (DNR) - Comfort care [id number]  Morton Stall, NP Inpatient   04/05/2023 2245 04/09/2023 1504 Limited: Do not attempt resuscitation (DNR) -DNR-LIMITED -Do Not Intubate/DNI  [id number]  Hannah Beat, MD ED   02/19/2023 2228 02/22/2023 2031 Limited: Do not attempt resuscitation (DNR) -DNR-LIMITED -Do Not Intubate/DNI  [id number]  Lorretta Harp, MD ED   02/19/2023 2047 02/19/2023 2228 Full Code [id number]  Lorretta Harp, MD ED   11/01/2022 2235 11/05/2022 2001 Limited: Do not attempt resuscitation (DNR) -DNR-LIMITED -Do Not Intubate/DNI  [id number]  Lorretta Harp, MD ED   11/01/2022 2159 11/01/2022 2235 Do not attempt resuscitation (DNR) - Comfort care [id number]  Lorretta Harp, MD ED   08/24/2021 0504 08/27/2021 2019 Full Code [id number]  Hugelmeyer, Alexis, DO ED   12/03/2017 2143 12/04/2017 1757 Full Code [id number]  Ihor Austin, MD Inpatient      Advance Directive Documentation    Flowsheet Row Most Recent Value  Type of Advance Directive Healthcare Power of [place],  Living will, Out of facility DNR (pink MOST or yellow form)  Pre-existing out of facility DNR order (yellow form or pink MOST form) --  "MOST" Form in Place? --       Prognosis:  < 6 months   Care plan was discussed with attending following visit.  Thank you for allowing the Palliative Medicine Team to assist in the care of this patient.   Morton Stall, NP  Please contact Palliative Medicine Team phone at [phone] for questions and concerns.
